# Patient Record
Sex: Female | Born: 1945 | Race: White | Hispanic: No | Marital: Single | Smoking: Never smoker
Health system: Southern US, Community
[De-identification: ages and names within clinical notes are randomized; demographics above are authoritative.]

## PROBLEM LIST (undated history)

## (undated) DIAGNOSIS — T8859XA Other complications of anesthesia, initial encounter: Secondary | ICD-10-CM

## (undated) DIAGNOSIS — C50911 Malignant neoplasm of unspecified site of right female breast: Secondary | ICD-10-CM

## (undated) DIAGNOSIS — R112 Nausea with vomiting, unspecified: Secondary | ICD-10-CM

## (undated) DIAGNOSIS — L900 Lichen sclerosus et atrophicus: Secondary | ICD-10-CM

## (undated) DIAGNOSIS — R011 Cardiac murmur, unspecified: Secondary | ICD-10-CM

## (undated) DIAGNOSIS — Z9889 Other specified postprocedural states: Secondary | ICD-10-CM

## (undated) DIAGNOSIS — Z923 Personal history of irradiation: Secondary | ICD-10-CM

## (undated) DIAGNOSIS — C50919 Malignant neoplasm of unspecified site of unspecified female breast: Secondary | ICD-10-CM

## (undated) DIAGNOSIS — E119 Type 2 diabetes mellitus without complications: Secondary | ICD-10-CM

## (undated) DIAGNOSIS — I10 Essential (primary) hypertension: Secondary | ICD-10-CM

## (undated) DIAGNOSIS — N189 Chronic kidney disease, unspecified: Secondary | ICD-10-CM

## (undated) DIAGNOSIS — G4730 Sleep apnea, unspecified: Secondary | ICD-10-CM

## (undated) HISTORY — DX: Lichen sclerosus et atrophicus: L90.0

## (undated) HISTORY — PX: ABDOMINAL HYSTERECTOMY: SHX81

## (undated) HISTORY — PX: COLONOSCOPY: SHX174

## (undated) HISTORY — PX: APPENDECTOMY: SHX54

## (undated) HISTORY — PX: SEPTOPLASTY: SUR1290

## (undated) HISTORY — DX: Malignant neoplasm of unspecified site of unspecified female breast: C50.919

## (undated) HISTORY — DX: Malignant neoplasm of unspecified site of right female breast: C50.911

## (undated) HISTORY — PX: TUBAL LIGATION: SHX77

## (undated) HISTORY — PX: RHINOPLASTY: SUR1284

---

## 2007-04-25 DIAGNOSIS — B029 Zoster without complications: Secondary | ICD-10-CM | POA: Insufficient documentation

## 2010-06-10 DIAGNOSIS — E119 Type 2 diabetes mellitus without complications: Secondary | ICD-10-CM | POA: Insufficient documentation

## 2010-06-10 DIAGNOSIS — I10 Essential (primary) hypertension: Secondary | ICD-10-CM | POA: Insufficient documentation

## 2010-06-17 DIAGNOSIS — L270 Generalized skin eruption due to drugs and medicaments taken internally: Secondary | ICD-10-CM | POA: Insufficient documentation

## 2015-09-22 DIAGNOSIS — R011 Cardiac murmur, unspecified: Secondary | ICD-10-CM | POA: Insufficient documentation

## 2016-06-16 ENCOUNTER — Emergency Department
Admission: EM | Admit: 2016-06-16 | Discharge: 2016-06-16 | Disposition: A | Discharge status: Home / Self Care | Payer: Medicare Other | Attending: Emergency Medicine | Admitting: Emergency Medicine

## 2016-06-16 ENCOUNTER — Encounter: Payer: Self-pay | Admitting: Emergency Medicine

## 2016-06-16 DIAGNOSIS — I10 Essential (primary) hypertension: Secondary | ICD-10-CM | POA: Insufficient documentation

## 2016-06-16 DIAGNOSIS — R319 Hematuria, unspecified: Secondary | ICD-10-CM | POA: Diagnosis present

## 2016-06-16 DIAGNOSIS — N3090 Cystitis, unspecified without hematuria: Secondary | ICD-10-CM | POA: Insufficient documentation

## 2016-06-16 DIAGNOSIS — E119 Type 2 diabetes mellitus without complications: Secondary | ICD-10-CM | POA: Insufficient documentation

## 2016-06-16 DIAGNOSIS — N390 Urinary tract infection, site not specified: Secondary | ICD-10-CM

## 2016-06-16 HISTORY — DX: Essential (primary) hypertension: I10

## 2016-06-16 HISTORY — DX: Type 2 diabetes mellitus without complications: E11.9

## 2016-06-16 LAB — URINALYSIS COMPLETE WITH MICROSCOPIC (ARMC ONLY)
BILIRUBIN URINE: NEGATIVE
Bacteria, UA: NONE SEEN
Glucose, UA: >500 mg/dL — AB
KETONES UR: NEGATIVE mg/dL
Nitrite: POSITIVE — AB
PH: 8 (ref 5.0–8.0)
Protein, ur: 100 mg/dL — AB
SQUAMOUS EPITHELIAL / LPF: NONE SEEN
Specific Gravity, Urine: 1.013 (ref 1.005–1.030)

## 2016-06-16 MED ORDER — LIDOCAINE HCL (PF) 1 % IJ SOLN
INTRAMUSCULAR | Status: AC
  Filled 2016-06-16: qty 5

## 2016-06-16 MED ORDER — CEPHALEXIN 500 MG PO CAPS: 500.0000 mg | ORAL_CAPSULE | Freq: Four times a day (QID) | ORAL | 0 refills | Status: AC

## 2016-06-16 MED ORDER — CEFTRIAXONE SODIUM 1 G IJ SOLR
1.0000 g | Freq: Once | INTRAMUSCULAR | Status: AC
  Administered 2016-06-16: 1 g via INTRAMUSCULAR
  Filled 2016-06-16 (×2): qty 10

## 2016-06-16 MED ORDER — FLUCONAZOLE 150 MG PO TABS: 150.0000 mg | ORAL_TABLET | Freq: Once | ORAL | 0 refills | Status: AC

## 2016-06-16 NOTE — ED Triage Notes (Addendum)
Patient ambulatory to triage with steady gait, without difficulty or distress noted; pt st x 2 days having urinary frequency and pressure; taking azo with some relief; denies abd/back pain

## 2016-06-16 NOTE — ED Provider Notes (Signed)
[facility] Emergency Department Provider Note   ____________________________________________   First MD Initiated Contact with Patient 06/16/16 0507     (approximate)  I have reviewed the triage vital signs and the nursing notes.   HISTORY  Chief Complaint Dysuria    HPI Morrigan Asti is a 70 y.o. female who comes into the hospital today with a UTI. She reports that she started having some symptoms 5 days ago. The patient reports that she had some pressure in her lower abdomen. She took Azo for 2 days as well as started drinking cranberry juice. She reports this started to feel well but better. Around 2 AM the patient got up to use the restroom. She did not turn on the light and didn't think much of it. She reports that 15 minutes later she felt the urge to urinate again when she did she noticed blood in her urine. The patient denies any fevers, denies any pain, denies any nausea or vomiting. She reports that she has been feeling well. She has had UTIs in the past. The patient has a history of diabetes and reports that due to stress her blood sugars have been a little bit out of whack. She reports that they have been in the 200s at home. The patient reports that she's been doing a lot with her mom so she wanted to get her urinary tract infection taken care of as quickly as possible.   Past Medical History:  Diagnosis Date  . Diabetes mellitus without complication ([place])   . Hypertension     There are no active problems to display for this patient.   Past Surgical History:  Procedure Laterality Date  . ABDOMINAL HYSTERECTOMY      Prior to Admission medications   Medication Sig Start Date End Date Taking? Authorizing Provider  cephALEXin (KEFLEX) 500 MG capsule Take 1 capsule (500 mg total) by mouth 4 (four) times daily. 06/16/16 06/26/16  Loney Hering, MD  fluconazole (DIFLUCAN) 150 MG tablet Take 1 tablet (150 mg total) by mouth once. 06/16/16  06/16/16  Loney Hering, MD    Allergies Bactrim [sulfamethoxazole-trimethoprim] and Macrodantin [nitrofurantoin macrocrystal]  No family history on file.  Social History Social History  Substance Use Topics  . Smoking status: Never Smoker  . Smokeless tobacco: Not on file  . Alcohol use No    Review of Systems Constitutional: No fever/chills Eyes: No visual changes. ENT: No sore throat. Cardiovascular: Denies chest pain. Respiratory: Denies shortness of breath. Gastrointestinal: Suprapubic pressure.  No nausea, no vomiting.  No diarrhea.  No constipation. Genitourinary: Hematuria, urgency Musculoskeletal: Negative for back pain. Skin: Negative for rash. Neurological: Negative for headaches, focal weakness or numbness.  10-point ROS otherwise negative.  ____________________________________________   PHYSICAL EXAM:  VITAL SIGNS: ED Triage Vitals  Enc Vitals Group     BP 06/16/16 0307 (!) 184/83     Pulse Rate 06/16/16 0307 72     Resp 06/16/16 0307 18     Temp 06/16/16 0307 97.9 F (36.6 C)     Temp Source 06/16/16 0307 Oral     SpO2 06/16/16 0307 97 %     Weight 06/16/16 0301 140 lb (63.5 kg)     Height 06/16/16 0301 4\' 11"  (1.499 m)     Head Circumference --      Peak Flow --      Pain Score --      Pain Loc --  Pain Edu? --      Excl. in [place]? --     Constitutional: Alert and oriented. Well appearing and in no acute distress. Eyes: Conjunctivae are normal. PERRL. EOMI. Head: Atraumatic. Nose: No congestion/rhinnorhea. Mouth/Throat: Mucous membranes are moist.  Oropharynx non-erythematous. Cardiovascular: Normal rate, regular rhythm. Grossly normal heart sounds.  Good peripheral circulation. Respiratory: Normal respiratory effort.  No retractions. Lungs CTAB. Gastrointestinal: Soft and nontender. No distention. Positive bowel sounds Musculoskeletal: No lower extremity tenderness nor edema.  Neurologic:  Normal speech and language.  Skin:  Skin is  warm, dry and intact.  Psychiatric: Mood and affect are normal.   ____________________________________________   LABS (all labs ordered are listed, but only abnormal results are displayed)  Labs Reviewed  URINALYSIS COMPLETEWITH MICROSCOPIC ([facility] ONLY) - Abnormal; Notable for the following:       Result Value   Color, Urine AMBER (*)    APPearance CLOUDY (*)    Glucose, UA >500 (*)    Hgb urine dipstick 3+ (*)    Protein, ur 100 (*)    Nitrite POSITIVE (*)    Leukocytes, UA 2+ (*)    All other components within normal limits  URINE CULTURE   ____________________________________________  EKG  none ____________________________________________  RADIOLOGY  none ____________________________________________   PROCEDURES  Procedure(s) performed: None  Procedures  Critical Care performed: No  ____________________________________________   INITIAL IMPRESSION / ASSESSMENT AND PLAN / ED COURSE  Pertinent labs & imaging results that were available during my care of the patient were reviewed by me and considered in my medical decision making (see chart for details).  This is a 70 year old female who comes into the hospital today with hematuria and UTI symptoms. The patient denies any fever or no nausea no vomiting no back pain. The patient's urinalysis is concerning for a urinary tract infection. She has positive nitrates, leuk esterase, too numerous to count white blood cells and red blood cells. I will give the patient a shot of ceftriaxone. As the patient has no other symptoms I will not check any further blood work. I will give the patient a prescription for antibiotics as well as for some Diflucan. She reports that she did have some vaginal itching but it is resolved at this time. In the event she develops a yeast infection after the antibiotic she can take this Diflucan. I have encouraged the patient to return with any further concerns or symptoms and to follow-up with her  primary care physician. The patient has no further questions or concerns.  Clinical Course     ____________________________________________   FINAL CLINICAL IMPRESSION(S) / ED DIAGNOSES  Final diagnoses:  UTI (lower urinary tract infection)  Cystitis      NEW MEDICATIONS STARTED DURING THIS VISIT:  New Prescriptions   CEPHALEXIN (KEFLEX) 500 MG CAPSULE    Take 1 capsule (500 mg total) by mouth 4 (four) times daily.   FLUCONAZOLE (DIFLUCAN) 150 MG TABLET    Take 1 tablet (150 mg total) by mouth once.     Note:  This document was prepared using Dragon voice recognition software and may include unintentional dictation errors.    Loney Hering, MD 06/16/16 [id number]

## 2016-06-16 NOTE — ED Notes (Signed)
Discharge instructions reviewed with patient. Patient verbalized understanding. Patient ambulated to lobby without difficulty.   

## 2016-06-18 LAB — URINE CULTURE

## 2016-12-19 ENCOUNTER — Encounter: Payer: Self-pay | Admitting: Emergency Medicine

## 2016-12-19 ENCOUNTER — Ambulatory Visit
Admission: EM | Admit: 2016-12-19 | Discharge: 2016-12-19 | Disposition: A | Discharge status: Home / Self Care | Payer: Medicare Other | Attending: Family Medicine | Admitting: Family Medicine

## 2016-12-19 DIAGNOSIS — T7840XA Allergy, unspecified, initial encounter: Secondary | ICD-10-CM | POA: Diagnosis not present

## 2016-12-19 DIAGNOSIS — R42 Dizziness and giddiness: Secondary | ICD-10-CM | POA: Diagnosis not present

## 2016-12-19 DIAGNOSIS — E139 Other specified diabetes mellitus without complications: Secondary | ICD-10-CM | POA: Diagnosis not present

## 2016-12-19 LAB — GLUCOSE, CAPILLARY: Glucose-Capillary: 232 mg/dL — ABNORMAL HIGH (ref 65–99)

## 2016-12-19 NOTE — ED Triage Notes (Signed)
Patient states that she started a diabetic medication on Wed.  Patient reports jittery, dizziness and cough that started this morning.

## 2016-12-19 NOTE — Discharge Instructions (Signed)
Over the counter zyrtec (one daily) Benadryl liquid (25mg ) at bedtime and during the day as needed Contact PCP on Monday

## 2016-12-19 NOTE — ED Provider Notes (Signed)
[facility]    CSN: [id number] Arrival date & time: 12/19/16  4098     History   Chief Complaint Chief Complaint  Patient presents with  . Dizziness    HPI Katessa Attridge is a 71 y.o. female.   71 yo female with a c/o nasal congestion, lightheadedness, mild cough and jitteriness for the past 2-3 days. Denies any fevers, chills, sore throat, chest pains, wheezing, shortness of breath. States she's never had seasonal allergies this severe and wondered if maybe symptoms were due to her new diabetic medication that she started 5 days ago.    The history is provided by the patient.  Dizziness  Quality:  Lightheadedness   Past Medical History:  Diagnosis Date  . Diabetes mellitus without complication ([place])   . Hypertension     There are no active problems to display for this patient.   Past Surgical History:  Procedure Laterality Date  . ABDOMINAL HYSTERECTOMY      OB History    No data available       Home Medications    Prior to Admission medications   Medication Sig Start Date End Date Taking? Authorizing Provider  aspirin EC 81 MG tablet Take 81 mg by mouth daily.   Yes Historical Provider, MD  Calcium Carbonate-Vit D-Min (CALCIUM 1200 PO) Take 1,200 mg by mouth 2 (two) times daily.   Yes Historical Provider, MD  empagliflozin (JARDIANCE) 10 MG TABS tablet Take 10 mg by mouth daily.   Yes Historical Provider, MD  glimepiride (AMARYL) 4 MG tablet Take 4 mg by mouth daily with breakfast.   Yes Historical Provider, MD  metFORMIN (GLUCOPHAGE) 1000 MG tablet Take 1,000 mg by mouth 2 (two) times daily with a meal.   Yes Historical Provider, MD  Multiple Vitamin (MULTIVITAMIN) tablet Take 1 tablet by mouth daily.   Yes Historical Provider, MD  Omega-3 Fatty Acids (FISH OIL) 1200 MG CAPS Take 2,400 mg by mouth 2 (two) times daily.   Yes Historical Provider, MD  pravastatin (PRAVACHOL) 80 MG tablet Take 80 mg by mouth daily.   Yes Historical Provider, MD    quinapril (ACCUPRIL) 20 MG tablet Take 20 mg by mouth at bedtime.   Yes Historical Provider, MD    Family History History reviewed. No pertinent family history.  Social History Social History  Substance Use Topics  . Smoking status: Never Smoker  . Smokeless tobacco: Never Used  . Alcohol use No     Allergies   Bactrim [sulfamethoxazole-trimethoprim] and Macrodantin [nitrofurantoin macrocrystal]   Review of Systems Review of Systems  Neurological: Positive for dizziness.     Physical Exam Triage Vital Signs ED Triage Vitals  Enc Vitals Group     BP 12/19/16 0931 135/73     Pulse Rate 12/19/16 0931 88     Resp 12/19/16 0931 16     Temp 12/19/16 0931 97.7 F (36.5 C)     Temp Source 12/19/16 0931 Oral     SpO2 12/19/16 0931 100 %     Weight 12/19/16 0929 140 lb (63.5 kg)     Height 12/19/16 0929 4\' 11"  (1.499 m)     Head Circumference --      Peak Flow --      Pain Score 12/19/16 0929 0     Pain Loc --      Pain Edu? --      Excl. in [place]? --    No data found.   Updated  Vital Signs BP 135/73 (BP Location: Right Arm)   Pulse 88   Temp 97.7 F (36.5 C) (Oral)   Resp 16   Ht 4\' 11"  (1.499 m)   Wt 140 lb (63.5 kg)   SpO2 100%   BMI 28.28 kg/m   Visual Acuity Right Eye Distance:   Left Eye Distance:   Bilateral Distance:    Right Eye Near:   Left Eye Near:    Bilateral Near:     Physical Exam  Constitutional: She appears well-developed and well-nourished. No distress.  HENT:  Head: Normocephalic and atraumatic.  Right Ear: Tympanic membrane, external ear and ear canal normal.  Left Ear: Tympanic membrane, external ear and ear canal normal.  Nose: Mucosal edema present. No rhinorrhea, nose lacerations, sinus tenderness, nasal deformity, septal deviation or nasal septal hematoma. No epistaxis.  No foreign bodies. Right sinus exhibits no maxillary sinus tenderness and no frontal sinus tenderness. Left sinus exhibits no maxillary sinus tenderness and no  frontal sinus tenderness.  Mouth/Throat: Uvula is midline, oropharynx is clear and moist and mucous membranes are normal. No oropharyngeal exudate.  Eyes: Conjunctivae and EOM are normal. Pupils are equal, round, and reactive to light. Right eye exhibits no discharge. Left eye exhibits no discharge. No scleral icterus.  Neck: Normal range of motion. Neck supple. No thyromegaly present.  Cardiovascular: Normal rate, regular rhythm and normal heart sounds.   Pulmonary/Chest: Effort normal and breath sounds normal. No respiratory distress. She has no wheezes. She has no rales.  Lymphadenopathy:    She has no cervical adenopathy.  Skin: No rash noted. She is not diaphoretic. No erythema.  Nursing note and vitals reviewed.    UC Treatments / Results  Labs (all labs ordered are listed, but only abnormal results are displayed) Labs Reviewed  GLUCOSE, CAPILLARY - Abnormal; Notable for the following:       Result Value   Glucose-Capillary 232 (*)    All other components within normal limits  CBG MONITORING, ED    EKG  EKG Interpretation None       Radiology No results found.  Procedures Procedures (including critical care time)  Medications Ordered in UC Medications - No data to display   Initial Impression / Assessment and Plan / UC Course  I have reviewed the triage vital signs and the nursing notes.  Pertinent labs & imaging results that were available during my care of the patient were reviewed by me and considered in my medical decision making (see chart for details).       Final Clinical Impressions(s) / UC Diagnoses   Final diagnoses:  Allergic reaction, initial encounter  (seasonal allergies vs other?)   New Prescriptions Discharge Medication List as of 12/19/2016  9:51 AM     1. Possible etiologies and diagnosis reviewed with patient 2. Recommend supportive treatment with otc allergy medication 3. Hold new diabetic medication tomorrow Nancy Fetter) and contact  PCP on Monday morning.  4. Follow-up prn if symptoms worsen or don't improve   Norval Gable, MD 12/19/16 1220

## 2017-10-18 ENCOUNTER — Other Ambulatory Visit: Payer: Self-pay | Admitting: Nurse Practitioner

## 2017-10-18 DIAGNOSIS — Z1231 Encounter for screening mammogram for malignant neoplasm of breast: Secondary | ICD-10-CM

## 2017-10-26 ENCOUNTER — Ambulatory Visit
Admission: RE | Admit: 2017-10-26 | Discharge: 2017-10-26 | Disposition: A | Discharge status: Home / Self Care | Payer: Medicare Other | Source: Ambulatory Visit | Attending: Nurse Practitioner | Admitting: Nurse Practitioner

## 2017-10-26 DIAGNOSIS — Z1231 Encounter for screening mammogram for malignant neoplasm of breast: Secondary | ICD-10-CM | POA: Insufficient documentation

## 2017-10-26 DIAGNOSIS — R928 Other abnormal and inconclusive findings on diagnostic imaging of breast: Secondary | ICD-10-CM | POA: Diagnosis not present

## 2017-10-26 DIAGNOSIS — N6310 Unspecified lump in the right breast, unspecified quadrant: Secondary | ICD-10-CM | POA: Diagnosis not present

## 2017-10-28 ENCOUNTER — Inpatient Hospital Stay
Admission: RE | Admit: 2017-10-28 | Discharge: 2017-10-28 | Disposition: A | Discharge status: Home / Self Care | Payer: Self-pay | Source: Ambulatory Visit | Attending: *Deleted | Admitting: *Deleted

## 2017-10-28 ENCOUNTER — Other Ambulatory Visit: Payer: Self-pay | Admitting: *Deleted

## 2017-10-28 DIAGNOSIS — Z9289 Personal history of other medical treatment: Secondary | ICD-10-CM

## 2017-11-08 ENCOUNTER — Other Ambulatory Visit: Payer: Self-pay | Admitting: Nurse Practitioner

## 2017-11-08 DIAGNOSIS — N6310 Unspecified lump in the right breast, unspecified quadrant: Secondary | ICD-10-CM

## 2017-11-08 DIAGNOSIS — R928 Other abnormal and inconclusive findings on diagnostic imaging of breast: Secondary | ICD-10-CM

## 2017-11-11 ENCOUNTER — Ambulatory Visit
Admission: RE | Admit: 2017-11-11 | Discharge: 2017-11-11 | Disposition: A | Discharge status: Home / Self Care | Payer: Medicare Other | Source: Ambulatory Visit | Attending: Nurse Practitioner | Admitting: Nurse Practitioner

## 2017-11-11 DIAGNOSIS — N6313 Unspecified lump in the right breast, lower outer quadrant: Secondary | ICD-10-CM | POA: Insufficient documentation

## 2017-11-11 DIAGNOSIS — R928 Other abnormal and inconclusive findings on diagnostic imaging of breast: Secondary | ICD-10-CM | POA: Insufficient documentation

## 2017-11-11 DIAGNOSIS — N6310 Unspecified lump in the right breast, unspecified quadrant: Secondary | ICD-10-CM

## 2017-11-11 DIAGNOSIS — N6311 Unspecified lump in the right breast, upper outer quadrant: Secondary | ICD-10-CM | POA: Diagnosis not present

## 2017-11-12 ENCOUNTER — Other Ambulatory Visit: Payer: Self-pay | Admitting: Nurse Practitioner

## 2017-11-12 DIAGNOSIS — R928 Other abnormal and inconclusive findings on diagnostic imaging of breast: Secondary | ICD-10-CM

## 2017-11-12 DIAGNOSIS — N6310 Unspecified lump in the right breast, unspecified quadrant: Secondary | ICD-10-CM

## 2017-11-15 ENCOUNTER — Encounter: Payer: Self-pay | Admitting: Anesthesiology

## 2017-11-15 ENCOUNTER — Ambulatory Visit
Admission: RE | Admit: 2017-11-15 | Discharge: 2017-11-15 | Disposition: A | Discharge status: Home / Self Care | Payer: Medicare Other | Source: Ambulatory Visit | Attending: Gastroenterology | Admitting: Gastroenterology

## 2017-11-15 ENCOUNTER — Ambulatory Visit: Payer: Medicare Other | Admitting: Anesthesiology

## 2017-11-15 ENCOUNTER — Encounter
Admission: RE | Disposition: A | Discharge status: Home / Self Care | Payer: Self-pay | Source: Ambulatory Visit | Attending: Gastroenterology

## 2017-11-15 DIAGNOSIS — Z882 Allergy status to sulfonamides status: Secondary | ICD-10-CM | POA: Diagnosis not present

## 2017-11-15 DIAGNOSIS — D122 Benign neoplasm of ascending colon: Secondary | ICD-10-CM | POA: Insufficient documentation

## 2017-11-15 DIAGNOSIS — Z7982 Long term (current) use of aspirin: Secondary | ICD-10-CM | POA: Insufficient documentation

## 2017-11-15 DIAGNOSIS — I10 Essential (primary) hypertension: Secondary | ICD-10-CM | POA: Insufficient documentation

## 2017-11-15 DIAGNOSIS — R011 Cardiac murmur, unspecified: Secondary | ICD-10-CM | POA: Insufficient documentation

## 2017-11-15 DIAGNOSIS — E119 Type 2 diabetes mellitus without complications: Secondary | ICD-10-CM | POA: Diagnosis not present

## 2017-11-15 DIAGNOSIS — Z79899 Other long term (current) drug therapy: Secondary | ICD-10-CM | POA: Diagnosis not present

## 2017-11-15 DIAGNOSIS — Z1211 Encounter for screening for malignant neoplasm of colon: Secondary | ICD-10-CM | POA: Diagnosis present

## 2017-11-15 DIAGNOSIS — Z8371 Family history of colonic polyps: Secondary | ICD-10-CM | POA: Diagnosis not present

## 2017-11-15 DIAGNOSIS — K5730 Diverticulosis of large intestine without perforation or abscess without bleeding: Secondary | ICD-10-CM | POA: Insufficient documentation

## 2017-11-15 DIAGNOSIS — Z7984 Long term (current) use of oral hypoglycemic drugs: Secondary | ICD-10-CM | POA: Diagnosis not present

## 2017-11-15 HISTORY — DX: Cardiac murmur, unspecified: R01.1

## 2017-11-15 HISTORY — PX: COLONOSCOPY WITH PROPOFOL: SHX5780

## 2017-11-15 LAB — GLUCOSE, CAPILLARY: Glucose-Capillary: 191 mg/dL — ABNORMAL HIGH (ref 65–99)

## 2017-11-15 SURGERY — COLONOSCOPY WITH PROPOFOL
Anesthesia: General

## 2017-11-15 MED ORDER — SODIUM CHLORIDE 0.9 % IV SOLN
INTRAVENOUS | Status: DC
  Administered 2017-11-15: 1000 mL via INTRAVENOUS

## 2017-11-15 MED ORDER — LIDOCAINE HCL (PF) 1 % IJ SOLN
2.0000 mL | Freq: Once | INTRAMUSCULAR | Status: AC
  Administered 2017-11-15: 0.3 mL via INTRADERMAL

## 2017-11-15 MED ORDER — PROPOFOL 500 MG/50ML IV EMUL
INTRAVENOUS | Status: AC
  Filled 2017-11-15: qty 50

## 2017-11-15 MED ORDER — PROPOFOL 10 MG/ML IV BOLUS
INTRAVENOUS | Status: DC | PRN
  Administered 2017-11-15: 70 mg via INTRAVENOUS

## 2017-11-15 MED ORDER — LIDOCAINE HCL (CARDIAC) 20 MG/ML IV SOLN
INTRAVENOUS | Status: DC | PRN
  Administered 2017-11-15: 30 mg via INTRAVENOUS

## 2017-11-15 MED ORDER — LIDOCAINE HCL (PF) 2 % IJ SOLN
INTRAMUSCULAR | Status: AC
  Filled 2017-11-15: qty 10

## 2017-11-15 MED ORDER — LIDOCAINE HCL (PF) 1 % IJ SOLN
INTRAMUSCULAR | Status: AC
  Administered 2017-11-15: 0.3 mL via INTRADERMAL
  Filled 2017-11-15: qty 2

## 2017-11-15 MED ORDER — PROPOFOL 500 MG/50ML IV EMUL
INTRAVENOUS | Status: DC | PRN
  Administered 2017-11-15: 100 ug/kg/min via INTRAVENOUS

## 2017-11-15 MED ORDER — SODIUM CHLORIDE 0.9 % IV SOLN
INTRAVENOUS | Status: DC
  Administered 2017-11-15: 11:00:00 via INTRAVENOUS

## 2017-11-15 NOTE — Op Note (Signed)
[facility] Gastroenterology Patient Name: Brianna Rogers Procedure Date: 11/15/2017 10:52 AM MRN: [id number] Account #: [id number] Date of Birth: [birth year] Admit Type: Outpatient Age: 72 Room: [facility] ENDO ROOM 1 Gender: Female Note Status: Finalized Procedure:            Colonoscopy Indications:          Family history of colonic polyps in a first-degree                        relative Providers:            Lollie Sails, MD Referring MD:         Juluis Rainier (Referring MD) Medicines:            Monitored Anesthesia Care Complications:        No immediate complications. Procedure:            Pre-Anesthesia Assessment:                       - ASA Grade Assessment: III - A patient with severe                        systemic disease.                       After obtaining informed consent, the colonoscope was                        passed under direct vision. Throughout the procedure,                        the patient's blood pressure, pulse, and oxygen                        saturations were monitored continuously. The                        Colonoscope was introduced through the anus and                        advanced to the the cecum, identified by appendiceal                        orifice and ileocecal valve. The colonoscopy was                        performed without difficulty. The patient tolerated the                        procedure well. The quality of the bowel preparation                        was good. Findings:      Multiple small-mouthed diverticula were found in the sigmoid colon and       descending colon.      A 1 mm polyp was found in the proximal ascending colon. The polyp was       sessile. The polyp was removed with a cold biopsy forceps. Resection and       retrieval were complete.      The digital rectal exam was normal. Impression:           -  Diverticulosis in the sigmoid colon and in the                        descending  colon.                       - One 1 mm polyp in the proximal ascending colon,                        removed with a cold biopsy forceps. Resected and                        retrieved. Recommendation:       - Discharge patient to home. Procedure Code(s):    --- Professional ---                       [id number], Colonoscopy, flexible; with biopsy, single or                        multiple CPT copyright 2016 American Medical Association. All rights reserved. The codes documented in this report are preliminary and upon coder review may  be revised to meet current compliance requirements. Lollie Sails, MD 11/15/2017 11:30:48 AM This report has been signed electronically. Number of Addenda: 0 Note Initiated On: 11/15/2017 10:52 AM Scope Withdrawal Time: 0 hours 6 minutes 20 seconds  Total Procedure Duration: 0 hours 24 minutes 3 seconds       [facility]

## 2017-11-15 NOTE — Anesthesia Postprocedure Evaluation (Signed)
Anesthesia Post Note  Patient: Brianna Rogers  Procedure(s) Performed: COLONOSCOPY WITH PROPOFOL (N/A )  Patient location during evaluation: Endoscopy Anesthesia Type: General Level of consciousness: awake and alert Pain management: pain level controlled Vital Signs Assessment: post-procedure vital signs reviewed and stable Respiratory status: spontaneous breathing, nonlabored ventilation, respiratory function stable and patient connected to nasal cannula oxygen Cardiovascular status: blood pressure returned to baseline and stable Postop Assessment: no apparent nausea or vomiting Anesthetic complications: no     Last Vitals:  Vitals:   11/15/17 1156 11/15/17 1206  BP: 125/66 (!) 112/58  Pulse: 70 78  Resp: 16 20  Temp:    SpO2: 98% 98%    Last Pain:  Vitals:   11/15/17 1136  TempSrc: Tympanic                 Precious Haws Piscitello

## 2017-11-15 NOTE — Anesthesia Preprocedure Evaluation (Signed)
Anesthesia Evaluation  Patient identified by MRN, date of birth, ID band Patient awake    Reviewed: Allergy & Precautions, H&P , NPO status , Patient's Chart, lab work & pertinent test results  History of Anesthesia Complications Negative for: history of anesthetic complications  Airway Mallampati: III  TM Distance: >3 FB Neck ROM: limited    Dental  (+) Chipped   Pulmonary neg pulmonary ROS, neg shortness of breath,           Cardiovascular Exercise Tolerance: Good hypertension, (-) angina(-) Past MI and (-) DOE + Valvular Problems/Murmurs      Neuro/Psych negative neurological ROS  negative psych ROS   GI/Hepatic negative GI ROS, Neg liver ROS, neg GERD  ,  Endo/Other  diabetes, Type 2  Renal/GU negative Renal ROS  negative genitourinary   Musculoskeletal   Abdominal   Peds  Hematology negative hematology ROS (+)   Anesthesia Other Findings Past Medical History: No date: Diabetes mellitus without complication (HCC) No date: Heart murmur No date: Hypertension  Past Surgical History: No date: ABDOMINAL HYSTERECTOMY No date: APPENDECTOMY No date: COLONOSCOPY No date: TUBAL LIGATION     Reproductive/Obstetrics negative OB ROS                             Anesthesia Physical Anesthesia Plan  ASA: III  Anesthesia Plan: General   Post-op Pain Management:    Induction: Intravenous  PONV Risk Score and Plan: Propofol infusion and TIVA  Airway Management Planned: Natural Airway and Nasal Cannula  Additional Equipment:   Intra-op Plan:   Post-operative Plan:   Informed Consent: I have reviewed the patients History and Physical, chart, labs and discussed the procedure including the risks, benefits and alternatives for the proposed anesthesia with the patient or authorized representative who has indicated his/her understanding and acceptance.   Dental Advisory Given  Plan  Discussed with: Anesthesiologist, CRNA and Surgeon  Anesthesia Plan Comments: (Patient consented for risks of anesthesia including but not limited to:  - adverse reactions to medications - risk of intubation if required - damage to teeth, lips or other oral mucosa - sore throat or hoarseness - Damage to heart, brain, lungs or loss of life  Patient voiced understanding.)        Anesthesia Quick Evaluation

## 2017-11-15 NOTE — H&P (Addendum)
Outpatient short stay form Pre-procedure 11/15/2017 10:51 AM Lollie Sails MD  Primary Physician: Gaetano Net NP  Reason for visit: Colonoscopy  History of present illness: Patient is a 72 year old female presenting today as above.  She has family history of colon polyps in a primary relative.  Held for several days.  She takes no other aspirin products or blood thinning agents.  She tolerated prep well.    Current Facility-Administered Medications:  .  0.9 %  sodium chloride infusion, , Intravenous, Continuous, Lollie Sails, MD, Last Rate: 20 mL/hr at 11/15/17 1036, 1,000 mL at 11/15/17 1036 .  0.9 %  sodium chloride infusion, , Intravenous, Continuous, Lollie Sails, MD  Medications Prior to Admission  Medication Sig Dispense Refill Last Dose  . aspirin EC 81 MG tablet Take 81 mg by mouth daily.   11/10/2017  . Calcium Carbonate-Vit D-Min (CALCIUM 1200 PO) Take 1,200 mg by mouth 2 (two) times daily.     . empagliflozin (JARDIANCE) 10 MG TABS tablet Take 10 mg by mouth daily.     Marland Kitchen glimepiride (AMARYL) 4 MG tablet Take 4 mg by mouth daily with breakfast.     . metFORMIN (GLUCOPHAGE) 1000 MG tablet Take 1,000 mg by mouth 2 (two) times daily with a meal.     . Multiple Vitamin (MULTIVITAMIN) tablet Take 1 tablet by mouth daily.     . Omega-3 Fatty Acids (FISH OIL) 1200 MG CAPS Take 2,400 mg by mouth 2 (two) times daily.     . pravastatin (PRAVACHOL) 80 MG tablet Take 80 mg by mouth daily.     . quinapril (ACCUPRIL) 20 MG tablet Take 20 mg by mouth at bedtime.        Allergies  Allergen Reactions  . Bactrim [Sulfamethoxazole-Trimethoprim]   . Macrodantin [Nitrofurantoin Macrocrystal]      Past Medical History:  Diagnosis Date  . Diabetes mellitus without complication ([place])   . Heart murmur   . Hypertension     Review of systems:      Physical Exam    Heart and lungs: Regular rate and rhythm without rub or gallop.    HEENT: Normocephalic atraumatic eyes  are anicteric    Other:    Pertinant exam for procedure: Soft nontender nondistended bowel sounds positive normoactive.    Planned proceedures: Colonoscopy and indicated procedures. I have discussed the risks benefits and complications of procedures to include not limited to bleeding, infection, perforation and the risk of sedation and the patient wishes to proceed.    Lollie Sails, MD Gastroenterology 11/15/2017  10:51 AM

## 2017-11-15 NOTE — Anesthesia Post-op Follow-up Note (Signed)
Anesthesia QCDR form completed.        

## 2017-11-15 NOTE — Transfer of Care (Signed)
Immediate Anesthesia Transfer of Care Note  Patient: Brianna Rogers  Procedure(s) Performed: COLONOSCOPY WITH PROPOFOL (N/A )  Patient Location: PACU and Endoscopy Unit  Anesthesia Type:General  Level of Consciousness: awake  Airway & Oxygen Therapy: Patient Spontanous Breathing  Post-op Assessment: Report given to RN  Post vital signs: stable  Last Vitals: There were no vitals filed for this visit.  Last Pain: There were no vitals filed for this visit.       Complications: No apparent anesthesia complications

## 2017-11-16 ENCOUNTER — Encounter: Payer: Self-pay | Admitting: Gastroenterology

## 2017-11-16 LAB — SURGICAL PATHOLOGY

## 2017-11-18 ENCOUNTER — Ambulatory Visit
Admission: RE | Admit: 2017-11-18 | Discharge: 2017-11-18 | Disposition: A | Discharge status: Home / Self Care | Payer: Medicare Other | Source: Ambulatory Visit | Attending: Nurse Practitioner | Admitting: Nurse Practitioner

## 2017-11-18 DIAGNOSIS — C50411 Malignant neoplasm of upper-outer quadrant of right female breast: Secondary | ICD-10-CM | POA: Insufficient documentation

## 2017-11-18 DIAGNOSIS — N6310 Unspecified lump in the right breast, unspecified quadrant: Secondary | ICD-10-CM | POA: Diagnosis present

## 2017-11-18 DIAGNOSIS — N6311 Unspecified lump in the right breast, upper outer quadrant: Secondary | ICD-10-CM | POA: Insufficient documentation

## 2017-11-18 DIAGNOSIS — R928 Other abnormal and inconclusive findings on diagnostic imaging of breast: Secondary | ICD-10-CM | POA: Insufficient documentation

## 2017-11-18 DIAGNOSIS — C50911 Malignant neoplasm of unspecified site of right female breast: Secondary | ICD-10-CM

## 2017-11-18 HISTORY — PX: BREAST BIOPSY: SHX20

## 2017-11-18 HISTORY — DX: Malignant neoplasm of unspecified site of right female breast: C50.911

## 2017-11-23 ENCOUNTER — Other Ambulatory Visit: Payer: Self-pay

## 2017-11-23 NOTE — Progress Notes (Signed)
  Oncology Nurse Navigator Documentation  Navigator Location: [facility] Onc (11/23/17 1600)   )Navigator Encounter Type: Introductory phone call (11/23/17 1600)   Abnormal Finding Date: 11/11/17 (11/23/17 1600) Confirmed Diagnosis Date: 12/16/17 (11/23/17 1600)                 Treatment Phase: Pre-Tx/Tx Discussion (11/23/17 1600) Barriers/Navigation Needs: Education;Coordination of Care (11/23/17 1600) Education: Understanding Cancer/ Treatment Options;Coping with Diagnosis/ Prognosis;Newly Diagnosed Cancer Education (11/23/17 1600)                        Time Spent with Patient: 30 (11/23/17 1600)  Phoned patient to introduce Navigation service.  Patient has Med/Onc consult with Dr. Mike Gip in [facility] tomorrow.  She sees Dr. Peyton Najjar on Friday.  Patient is an only chold, and is primary care for 36 year old mother who lives in [place].  She travels every other day to her home in [place] to take care of her animals, but is planning to move back to [facility] soon. States that is why she is moving her care to [facility].  States she has a good support system.  She will be given Breast Cancer Treatment Handbook/folder with hospital services at Med/Onc consult.

## 2017-11-24 ENCOUNTER — Encounter: Payer: Self-pay | Admitting: Hematology and Oncology

## 2017-11-24 ENCOUNTER — Inpatient Hospital Stay: Payer: Medicare Other | Attending: Hematology and Oncology | Admitting: Hematology and Oncology

## 2017-11-24 ENCOUNTER — Inpatient Hospital Stay: Payer: Medicare Other

## 2017-11-24 VITALS — BP 165/89 | HR 89 | Temp 97.9°F | Resp 18 | Ht 59.75 in | Wt 140.7 lb

## 2017-11-24 DIAGNOSIS — C50411 Malignant neoplasm of upper-outer quadrant of right female breast: Secondary | ICD-10-CM

## 2017-11-24 DIAGNOSIS — Z170 Estrogen receptor positive status [ER+]: Secondary | ICD-10-CM | POA: Insufficient documentation

## 2017-11-24 DIAGNOSIS — Z803 Family history of malignant neoplasm of breast: Secondary | ICD-10-CM | POA: Insufficient documentation

## 2017-11-24 DIAGNOSIS — M85852 Other specified disorders of bone density and structure, left thigh: Secondary | ICD-10-CM

## 2017-11-24 DIAGNOSIS — R17 Unspecified jaundice: Secondary | ICD-10-CM

## 2017-11-24 DIAGNOSIS — C50811 Malignant neoplasm of overlapping sites of right female breast: Secondary | ICD-10-CM | POA: Insufficient documentation

## 2017-11-24 DIAGNOSIS — R799 Abnormal finding of blood chemistry, unspecified: Secondary | ICD-10-CM

## 2017-11-24 LAB — BILIRUBIN, DIRECT: Bilirubin, Direct: 0.1 mg/dL (ref 0.1–0.5)

## 2017-11-24 LAB — COMPREHENSIVE METABOLIC PANEL
ALT: 21 U/L (ref 14–54)
ANION GAP: 11 (ref 5–15)
AST: 21 U/L (ref 15–41)
Albumin: 4.8 g/dL (ref 3.5–5.0)
Alkaline Phosphatase: 65 U/L (ref 38–126)
BUN: 19 mg/dL (ref 6–20)
CALCIUM: 10.6 mg/dL — AB (ref 8.9–10.3)
CHLORIDE: 99 mmol/L — AB (ref 101–111)
CO2: 26 mmol/L (ref 22–32)
CREATININE: 0.82 mg/dL (ref 0.44–1.00)
Glucose, Bld: 163 mg/dL — ABNORMAL HIGH (ref 65–99)
Potassium: 3.8 mmol/L (ref 3.5–5.1)
SODIUM: 136 mmol/L (ref 135–145)
Total Bilirubin: 1.3 mg/dL — ABNORMAL HIGH (ref 0.3–1.2)
Total Protein: 8.6 g/dL — ABNORMAL HIGH (ref 6.5–8.1)

## 2017-11-24 LAB — CBC WITH DIFFERENTIAL/PLATELET
BASOS PCT: 1 %
Basophils Absolute: 0.1 10*3/uL (ref 0–0.1)
EOS ABS: 0.2 10*3/uL (ref 0–0.7)
Eosinophils Relative: 2 %
HEMATOCRIT: 46.4 % (ref 35.0–47.0)
HEMOGLOBIN: 15.9 g/dL (ref 12.0–16.0)
Lymphocytes Relative: 21 %
Lymphs Abs: 1.9 10*3/uL (ref 1.0–3.6)
MCH: 30.5 pg (ref 26.0–34.0)
MCHC: 34.2 g/dL (ref 32.0–36.0)
MCV: 89.2 fL (ref 80.0–100.0)
MONOS PCT: 6 %
Monocytes Absolute: 0.6 10*3/uL (ref 0.2–0.9)
NEUTROS ABS: 6.1 10*3/uL (ref 1.4–6.5)
NEUTROS PCT: 70 %
Platelets: 223 10*3/uL (ref 150–440)
RBC: 5.21 MIL/uL — ABNORMAL HIGH (ref 3.80–5.20)
RDW: 13.6 % (ref 11.5–14.5)
WBC: 8.8 10*3/uL (ref 3.6–11.0)

## 2017-11-24 NOTE — Progress Notes (Signed)
[name] day:  11/24/2017  Chief Complaint: Brianna Rogers is a 72 y.o. female with right breast cancer who is referred in consultation by Gaetano Net, FNP for assessment and management.  HPI:  The patient undergoes yearly mammograms.  Screening mammogram on 09/05/2016 at [facility] revealed no significant abnormality.  Bilateral screening mammogram on 10/26/2017 revealed a possible mass int the right breast.  There were no suspicious findings in the left breast.  Diagnostic right mammogram and ultrasound on 11/11/2017 revealed a suspicious mass in the 9 o'clock position of the right breast.  Targeted ultrasound revealed a 1.5 x 2.2 x 1.9 cm mass with posterior acoustic shadowing 10 cm from the nipple.  The right axilla was negative for adenopathy.  She underwent ultrasound guided core biopsy of the mass on 11/18/2017.  Pathology revealed grade II invasive mammary carcinoma. ER, PR, and Her2/neu are pending.  She has an appointment with Dr. Windell Moment on 11/26/2017.  She underwent colonoscopy on 11/15/2017 by Dr. Gustavo Lah.  There was diverticulosis in the sigmoid colon and in the descending colon.  There was one 1 mm polyp in the proximal ascending colon.  Pathology revealed a tubular adenoma negative for high grade dysplasia or malignancy.  Symptomatically, she feels "good". Patient notes "discomfort" and bruising to her RIGHT breast biopsy site. She denies fevers, sweats, and significant weight loss. Patient denies recent infections. Patient has area of skin concern on her abdomen. She plans to see dermatology.   She denies pain in the clinic today. Patient performs monthly self breast examinations as recommended.   Menarche was as the age 69. Patient is a G0P0. She used contraceptives in her early 3s for 3 years.  She denies post menopausal hormone replacement therapy.  She had a total hysterectomy in her early 45s.   He mother had  breast cancer in her 18s.  Three paternal aunts had breast cancer (ages: 63s, 59s, 25s).  A maternal aunt had lung cancer.  Her maternal grandmother had renal cell carcinoma.  Her maternal great grandmother had "female cancer" in her 66s.  There is no family history of ovarian cancer.  There has been no genetic testing.   Past Medical History:  Diagnosis Date  . Breast cancer ([place])   . Breast cancer, right ([place]) 11/18/2017  . Diabetes mellitus without complication ([place])   . Heart murmur   . Hypertension     Past Surgical History:  Procedure Laterality Date  . ABDOMINAL HYSTERECTOMY    . APPENDECTOMY    . BREAST BIOPSY Right 11/18/2017   path pending  . COLONOSCOPY    . COLONOSCOPY WITH PROPOFOL N/A 11/15/2017   Procedure: COLONOSCOPY WITH PROPOFOL;  Surgeon: Lollie Sails, MD;  Location: [facility] ENDOSCOPY;  Service: Endoscopy;  Laterality: N/A;  . TUBAL LIGATION      Family History  Problem Relation Age of Onset  . Breast cancer Mother 26  . Cancer Mother   . Cancer Maternal Aunt   . Cancer Maternal Grandmother     Social History:  reports that  has never smoked. she has never used smokeless tobacco. She reports that she does not drink alcohol or use drugs.  She is an only child.  She is the primary caregiver for her 6 year old mother who lives in [place].  She travels every other day to her home in [place] to take care of her animals, but is planning to move back to  [facility] soon. Patient is retired from [employer]. The patient is alone today.  Allergies:  Allergies  Allergen Reactions  . Bactrim [Sulfamethoxazole-Trimethoprim] Other (See Comments)    Unknown  . Macrodantin [Nitrofurantoin Macrocrystal] Other (See Comments)    Unknown  . Sulfa Antibiotics Other (See Comments)    Flu like symptoms    Current Medications: Current Outpatient Medications  Medication Sig Dispense Refill  . aspirin EC 81 MG tablet Take 81 mg by mouth every evening.     .  empagliflozin (JARDIANCE) 10 MG TABS tablet Take 10 mg by mouth daily.    Marland Kitchen glimepiride (AMARYL) 4 MG tablet Take 4 mg by mouth daily with breakfast.    . metFORMIN (GLUCOPHAGE) 1000 MG tablet Take 1,000 mg by mouth 2 (two) times daily with a meal.    . Multiple Vitamin (MULTIVITAMIN) tablet Take 1 tablet by mouth daily.    . Omega-3 Fatty Acids (FISH OIL) 1200 MG CAPS Take 2,400 mg by mouth 2 (two) times daily.    . pravastatin (PRAVACHOL) 80 MG tablet Take 80 mg by mouth every evening.     . quinapril (ACCUPRIL) 20 MG tablet Take 20 mg by mouth daily.     Marland Kitchen acetaminophen (TYLENOL) 500 MG tablet Take 500 mg by mouth every 6 (six) hours as needed for moderate pain or headache.    . ibuprofen (ADVIL,MOTRIN) 200 MG tablet Take 200 mg by mouth every 6 (six) hours as needed for headache or moderate pain.     No current facility-administered medications for this visit.     Review of Systems:  GENERAL:  Feels good.  No fevers, sweats or weight loss. PERFORMANCE STATUS (ECOG):  0 HEENT:  No visual changes, runny nose, sore throat, mouth sores or tenderness. Lungs: No shortness of breath or cough.  No hemoptysis. Cardiac:  No chest pain, palpitations, orthopnea, or PND. GI:  No nausea, vomiting, diarrhea, constipation, melena or hematochezia. GU:  No urgency, frequency, dysuria, or hematuria. Musculoskeletal:  Osteopenia.  No back pain.  No muscle tenderness. Extremities:  No pain or swelling. Skin:  Bruising with discomfort after breast biopsy.  Abdominal rash, plans to see dermatologist. Neuro:  No headache, numbness or weakness, balance or coordination issues. Endocrine:  No diabetes, thyroid issues, hot flashes or night sweats. Psych:  No mood changes, depression or anxiety. Pain:  No focal pain. Review of systems:  All other systems reviewed and found to be negative.  Physical Exam: Blood pressure (!) 165/89, pulse 89, temperature 97.9 F (36.6 C), temperature source Tympanic, resp. rate  18, height 4' 11.75" (1.518 m), weight 140 lb 10.5 oz (63.8 kg). GENERAL:  Well developed, well nourished, woman sitting comfortably in the exam room in no acute distress. MENTAL STATUS:  Alert and oriented to person, place and time. HEAD:  Styled gray hair.  Normocephalic, atraumatic, face symmetric, no Cushingoid features. EYES:  Glasses.  Brown eyes.  Pupils equal round and reactive to light and accomodation.  No conjunctivitis or scleral icterus. ENT:  Oropharynx clear without lesion.  Tongue normal. Mucous membranes moist.  RESPIRATORY:  Clear to auscultation without rales, wheezes or rhonchi. CARDIOVASCULAR:  Regular rate and rhythm without murmur, rub or gallop. BREAST:  Right breast s/p biopsy with associated ecchymosis.  2 cm firm nodule palpable at the 9 o'clock position.  Left breast with inferior fibrocystic changes.  Inverted nipple.  No mass, skin changes or nipple discharge.  ABDOMEN:  Soft, non-tender, with active bowel sounds,  and no hepatosplenomegaly.  No masses. SKIN:  Tan with lacy band-like hypopigmentation across lower abdomen.  No rashes, ulcers or lesions. EXTREMITIES: No edema, no skin discoloration or tenderness.  No palpable cords. LYMPH NODES: No palpable cervical, supraclavicular, axillary or inguinal adenopathy  NEUROLOGICAL: Unremarkable. PSYCH:  Appropriate.   Office Visit on 11/24/2017  Component Date Value Ref Range Status  . Sodium 11/24/2017 136  135 - 145 mmol/L Final  . Potassium 11/24/2017 3.8  3.5 - 5.1 mmol/L Final  . Chloride 11/24/2017 99* 101 - 111 mmol/L Final  . CO2 11/24/2017 26  22 - 32 mmol/L Final  . Glucose, Bld 11/24/2017 163* 65 - 99 mg/dL Final  . BUN 11/24/2017 19  6 - 20 mg/dL Final  . Creatinine, Ser 11/24/2017 0.82  0.44 - 1.00 mg/dL Final  . Calcium 11/24/2017 10.6* 8.9 - 10.3 mg/dL Final  . Total Protein 11/24/2017 8.6* 6.5 - 8.1 g/dL Final  . Albumin 11/24/2017 4.8  3.5 - 5.0 g/dL Final  . AST 11/24/2017 21  15 - 41 U/L Final   . ALT 11/24/2017 21  14 - 54 U/L Final  . Alkaline Phosphatase 11/24/2017 65  38 - 126 U/L Final  . Total Bilirubin 11/24/2017 1.3* 0.3 - 1.2 mg/dL Final  . GFR calc non Af Amer 11/24/2017 >60  >60 mL/min Final  . GFR calc Af Amer 11/24/2017 >60  >60 mL/min Final   Comment: (NOTE) The eGFR has been calculated using the CKD EPI equation. This calculation has not been validated in all clinical situations. eGFR's persistently <60 mL/min signify possible Chronic Kidney Disease.   Georgiann Hahn gap 11/24/2017 11  5 - 15 Final   Performed at [facility], [address], [place], [place] [address]  . WBC 11/24/2017 8.8  3.6 - 11.0 K/uL Final  . RBC 11/24/2017 5.21* 3.80 - 5.20 MIL/uL Final  . Hemoglobin 11/24/2017 15.9  12.0 - 16.0 g/dL Final  . HCT 11/24/2017 46.4  35.0 - 47.0 % Final  . MCV 11/24/2017 89.2  80.0 - 100.0 fL Final  . MCH 11/24/2017 30.5  26.0 - 34.0 pg Final  . MCHC 11/24/2017 34.2  32.0 - 36.0 g/dL Final  . RDW 11/24/2017 13.6  11.5 - 14.5 % Final  . Platelets 11/24/2017 223  150 - 440 K/uL Final  . Neutrophils Relative % 11/24/2017 70  % Final  . Neutro Abs 11/24/2017 6.1  1.4 - 6.5 K/uL Final  . Lymphocytes Relative 11/24/2017 21  % Final  . Lymphs Abs 11/24/2017 1.9  1.0 - 3.6 K/uL Final  . Monocytes Relative 11/24/2017 6  % Final  . Monocytes Absolute 11/24/2017 0.6  0.2 - 0.9 K/uL Final  . Eosinophils Relative 11/24/2017 2  % Final  . Eosinophils Absolute 11/24/2017 0.2  0 - 0.7 K/uL Final  . Basophils Relative 11/24/2017 1  % Final  . Basophils Absolute 11/24/2017 0.1  0 - 0.1 K/uL Final   Performed at [facility], [address] [place], [place] [address]  . CA 27.29 11/24/2017 24.7  0.0 - 38.6 U/mL Final   Comment: (NOTE) Siemens Centaur Immunochemiluminometric Methodology [facility]) Values obtained with different assay methods or kits cannot be used interchangeably. Results cannot be interpreted as absolute evidence of the presence or  absence of malignant disease. Performed At: [facility] [place], [place] [id number] Rush Farmer MD [id number] Performed at [facility], [address] [place],  [address]   .  Kappa free light chain 11/24/2017 15.0  3.3 - 19.4 mg/L Final  . Lamda free light chains 11/24/2017 10.8  5.7 - 26.3 mg/L Final  . Kappa, lamda light chain ratio 11/24/2017 1.39  0.26 - 1.65 Final   Comment: (NOTE) Performed At: [facility] [place], [place] [id number] Rush Farmer MD [id number] Performed at [facility], [address] [place], [place] [address]   . Bilirubin, Direct 11/24/2017 0.1  0.1 - 0.5 mg/dL Final   Performed at [facility], [address], [place], [place] [address]  . PTH 11/24/2017 14* 15 - 65 pg/mL Final  . Calcium, Total (PTH) 11/24/2017 10.4* 8.7 - 10.3 mg/dL Final  . PTH Interp 11/24/2017 Comment   Final   Comment: (NOTE) Interpretation                 Intact PTH    Calcium                                (pg/mL)      (mg/dL) Normal                          15 - 65     8.6 - 10.2 Primary Hyperparathyroidism         >65          >10.2 Secondary Hyperparathyroidism       >65          <10.2 Non-Parathyroid Hypercalcemia       <65          >10.2 Hypoparathyroidism                  <15          < 8.6 Non-Parathyroid Hypocalcemia    15 - 65          < 8.6 Performed At: [facility] [place], [place] [id number] Rush Farmer MD [id number] Performed at [facility], [address], [place], [place] [address]     Assessment:  Brianna Rogers is a 72 y.o. female with clinical stage T2N0M0 right breast cancer s/p biopsy on 11/18/2017.  Pathology revealed grade II invasive mammary carcinoma. ER, PR, and Her2/neu are pending.  Diagnostic right mammogram and ultrasound on 11/11/2017 revealed a suspicious mass in the 9 o'clock position of the  right breast.  Targeted ultrasound revealed a 1.5 x 2.2 x 1.9 cm mass with posterior acoustic shadowing 10 cm from the nipple.  The right axilla was negative for adenopathy.  Bone density on 12/09/2015 revealed osteopenia with a T-score of -1.3 in the left hip.  She has a family history of breast cancer x 4 (mother and 3 paternal aunts) and "female cancer" (maternal great grandmother).  Symptomatically, she feels well.  Exam reveals a 2 cm mass in the right breast with no palpable axillary adenopathy.    Plan: 1.  Discuss diagnosis, staging, and management of breast cancer. Discuss lumpectomy vs. mastectomy.  Discuss awaiting ER, PR, and Her2/neu status.  If tumor is ER/PR + and Her2/neu negative, anticipate lumpectomy followed by radiation.  Anticipate sending Oncotype DX on tumor if lymph node status negative, hormone receptor positive and Her2/neu negative to assess the need for chemotherapy.  Discuss plan for chemotherapy if tumor is triple negative or Her2/neu positive. 2.  Labs today:  CBC  with diff, CMP, CA27.29. 3.  Discuss genetic testing given family history.  Patient agrees.  Invitae kit for BRCA 1/2.  Await test results prior to surgery. 4.  Follow up with Dr. Windell Moment (surgeon) as scheduled on 11/26/2017. 5.  Bone density study after 12/08/2017 6.  Present at tumor board on 11/29/2017. 7.  RTC on 11/30/2017 in [place] for review of work-up and discussion regarding direction of therapy  Addendum 1:  Additional labs drawn today for evaluation of a slightly elevated bilirubin (1.3) and elevated serum protein.  Direct bilirubin was 0.1 (normal).  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  SPEP and free light chain assay were normal.  She has an elevated calcium.  Discuss holding calcium and checking PTH.  Addendum 2:  Tumor is ER + (> 90%), PR + (>90%), and Her2/neu 2+.  FISH is pending.   Honor Loh, NP  11/24/2017, 11:03 AM   I saw and evaluated the patient, participating in the key  portions of the service and reviewing pertinent diagnostic studies and records.  I reviewed the nurse practitioner's note and agree with the findings and the plan.  The assessment and plan were discussed with the patient.  Multiple questions were asked by the patient and answered.   Nolon Stalls, MD 11/24/2017, 11:03 AM

## 2017-11-24 NOTE — Progress Notes (Signed)
Patient here today as new evaluation regarding right breast cancer.  Referred by Gaetano Net.   Patient had breast biopsy on Thursday, Feb. 28.  States she is still tender @ biopsy site.

## 2017-11-25 LAB — PTH, INTACT AND CALCIUM
Calcium, Total (PTH): 10.4 mg/dL — ABNORMAL HIGH (ref 8.7–10.3)
PTH: 14 pg/mL — ABNORMAL LOW (ref 15–65)

## 2017-11-25 LAB — KAPPA/LAMBDA LIGHT CHAINS
Kappa free light chain: 15 mg/L (ref 3.3–19.4)
Kappa, lambda light chain ratio: 1.39 (ref 0.26–1.65)
Lambda free light chains: 10.8 mg/L (ref 5.7–26.3)

## 2017-11-25 LAB — CANCER ANTIGEN 27.29: CA 27.29: 24.7 U/mL (ref 0.0–38.6)

## 2017-11-26 LAB — MULTIPLE MYELOMA PANEL, SERUM
Albumin SerPl Elph-Mcnc: 4 g/dL (ref 2.9–4.4)
Albumin/Glob SerPl: 1.3 (ref 0.7–1.7)
Alpha 1: 0.2 g/dL (ref 0.0–0.4)
Alpha2 Glob SerPl Elph-Mcnc: 0.9 g/dL (ref 0.4–1.0)
B-Globulin SerPl Elph-Mcnc: 1.3 g/dL (ref 0.7–1.3)
Gamma Glob SerPl Elph-Mcnc: 0.8 g/dL (ref 0.4–1.8)
Globulin, Total: 3.2 g/dL (ref 2.2–3.9)
IgA: 272 mg/dL (ref 87–352)
IgG (Immunoglobin G), Serum: 784 mg/dL (ref 700–1600)
IgM (Immunoglobulin M), Srm: 77 mg/dL (ref 26–217)
Total Protein ELP: 7.2 g/dL (ref 6.0–8.5)

## 2017-11-29 ENCOUNTER — Ambulatory Visit: Payer: Self-pay | Admitting: General Surgery

## 2017-11-29 ENCOUNTER — Other Ambulatory Visit: Payer: Self-pay | Admitting: General Surgery

## 2017-11-29 DIAGNOSIS — C50411 Malignant neoplasm of upper-outer quadrant of right female breast: Secondary | ICD-10-CM

## 2017-11-29 DIAGNOSIS — Z170 Estrogen receptor positive status [ER+]: Principal | ICD-10-CM

## 2017-11-29 LAB — SURGICAL PATHOLOGY

## 2017-11-29 NOTE — H&P (Signed)
PATIENT PROFILE: Brianna Rogers is a 72 y.o. female who presents to the Clinic for consultation at the request of Dr. Dayton Rogers for evaluation of right breast cancer.  PCP:  Brianna Lange, NP  HISTORY OF PRESENT ILLNESS: Brianna Rogers reports having regular screening mammography, since she cannot refers any symptoms from her breast since last mammography. Patient denies breast pain. Denies feeling a mass. Denies nipple retraction or drainage. Denies skin changes.   Last two mammograpies: Screening mammogram on 09/05/2016, negative and the last one 10/26/17 with a suspicious mass on the right breast at 9 o'clock. Biopsy of the mass revealed invasive mammary cancer. No lymph nodes seen on ultrasound.   First menstrual cycle at age 44 Last menstrual cycle ~72 years old after hysterectomy No pregnancies No hormonal therapies. 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  Pending core bx.      Diabetes mellitus, type 2 (CMS-HCC) Unknown   Overview    Followed by Dr. Eddie Rogers      Microalbuminuric diabetic nephropathy (CMS-HCC) Unknown   Hypertension Unknown   Hyperlipidemia, mixed Unknown   Cardiac murmur, unspecified 09/22/2015   Overview    2/6 systolic best heard at 2nd Lt ICS         GENERAL REVIEW OF SYSTEMS:   General ROS: negative for - chills, fatigue, fever, weight gain or weight loss Allergy and Immunology ROS: negative for - hives  Hematological and Lymphatic ROS: negative for - bleeding problems or bruising, negative for palpable nodes Endocrine ROS: negative for - heat or cold intolerance, hair changes Respiratory ROS: negative for - cough, shortness  of breath or wheezing Cardiovascular ROS: no chest pain or palpitations GI ROS: negative for nausea, vomiting, abdominal pain, diarrhea, constipation Musculoskeletal ROS: negative for - joint swelling or muscle pain Neurological ROS: negative for - confusion, syncope Dermatological ROS: negative for pruritus and rash Psychiatric: negative for anxiety, depression, difficulty sleeping and memory loss  MEDICATIONS: CurrentMedications        Current Outpatient Medications  Medication Sig Dispense Refill  . CALCIUM ACETATE ORAL Take 1,200 mg by mouth 2 (two) times daily.    Marland Kitchen aspirin 81 MG EC tablet Take 81 mg by mouth once daily.    . flash glucose sensor (FREESTYLE LIBRE SENSOR) Kit Use 3 each every 10 (ten) days. 3 kit 11  . glimepiride (AMARYL) 4 MG tablet Take 1 tablet (4 mg total) by mouth every morning 90 tablet 3  . JARDIANCE 10 mg Tab TAKE 1 TABLET BY MOUTH EVERY DAY BEFORE BREAKFAST 30 tablet 5  . metFORMIN (GLUCOPHAGE) 1000 MG tablet Take 1 tablet (1,000 mg total) by mouth 2 (two) times daily with meals 180 tablet 1  . multivitamin tablet Take 1 tablet by mouth once daily.    Marland Kitchen omega-3 fatty acids-fish oil 360-1,200 mg Cap Take 1,200 mg by mouth 2 (two) times daily.    . pravastatin (PRAVACHOL) 80 MG tablet TAKE 1 TABLET(80 MG) BY MOUTH EVERY DAY 90 tablet 1  . quinapril (ACCUPRIL) 20 MG tablet TAKE 1 TABLET(20 MG) BY MOUTH EVERY DAY 90 tablet 1   No current facility-administered  medications for this visit.       ALLERGIES: Nitrofurantoin macrocrystal and Sulfa (sulfonamide antibiotics)  PAST MEDICAL HISTORY:     Past Medical History:  Diagnosis Date  . Breast mass, right 11/11/2017   Irreg hypoechoic mass 1.5x2.2x1.9 cm w/ negative axillary adenopathy.  Pending core bx.  . Cardiac murmur, unspecified 5093   2/6 systolic best heard at 2nd Lt ICS  . Cherry angioma   . Deviated septum    s/p "scraping" procedure yrs ago, but sx returned  . Diabetes  mellitus, type 2 (CMS-HCC) ~2003   Followed by Dr. Eddie Rogers  . Diverticula of colon   . Hyperlipidemia, mixed ~2003  . Hypertension ~2003  . Microalbuminuric diabetic nephropathy (CMS-HCC)   . Osteopenia    Based on Dexa from 12/09/15  . Seborrheic keratoses   . Shingles 2008  . Vitamin D deficiency, unspecified 2014   Tx'd & resolved  . Wart    Tx'd w/ cryo x1 by Dermatology    PAST SURGICAL HISTORY:      Past Surgical History:  Procedure Laterality Date  . APPENDECTOMY  ~1991   w/ TAH  . COLONOSCOPY  11/29/2009   @ [facility] - Diverticulosis, FHPolyps(m), 5 yr rpt per provider  . COLONOSCOPY  11/15/2017   Tubular adenoma of the colon  . HYSTERECTOMY  ~1991   TAH w/ BSO 2/2 benign tumor on Lt ovary  . PERCUTANEOUS BIOPSY BREAST W/NEEDLE LOCALIZATION Right 11/18/2017   w/ marker chip placement  . TUBAL LIGATION  ~1986     FAMILY HISTORY:      Family History  Problem Relation Age of Onset  . Diabetes type II Mother   . Hypothyroidism Mother   . High blood pressure (Hypertension) Mother   . Heart disease Mother   . Breast cancer Mother        s/p Lt mastectomy  . Melanoma Mother   . Hyperlipidemia (Elevated cholesterol) Mother   . Colon polyps Mother   . Stroke Mother   . Heart disease Father   . High blood pressure (Hypertension) Father   . Hyperlipidemia (Elevated cholesterol) Father      SOCIAL HISTORY: Social History          Socioeconomic History  . Marital status: Single    Spouse name: Not on file  . Number of children: 0  . Years of education: Not on file  . Highest education level: Not on file  Occupational History  . Not on file  Social Needs  . Financial resource strain: Not on file  . Food insecurity:    Worry: Not on file    Inability: Not on file  . Transportation needs:    Medical: Not on file    Non-medical: Not on file  Tobacco Use  . Smoking status: Never Smoker  . Smokeless  tobacco: Never Used  Substance and Sexual Activity  . Alcohol use: No  . Drug use: No  . Sexual activity: Never  Other Topics Concern  . Not on file  Social History Narrative   Grew up in [place].  Living w/ her mother in [place] since 10/16, as mother's health requires increased care giving & night time assistance.  Still has her residence in [place].  Plans to sell her house & move into mother's house full-time when mother passes.      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      PHYSICAL EXAM:    Vitals:   11/26/17  0806  BP: (!) 153/93  Pulse: 76  Temp: 36.7 C (98 F)   Body mass index is 29 kg/m. Weight: 65.1 kg (143 lb 9.6 oz)   GENERAL: Alert, active, oriented x3  HEENT: Pupils equal reactive to light. Extraocular movements are intact. Sclera clear. Palpebral conjunctiva normal red color.Pharynx clear.  NECK: Supple with no palpable mass and no adenopathy.  LUNGS: Sound clear with no rales rhonchi or wheezes.  HEART: Regular rhythm S1 and S2 without murmur.  BREAST:  The breast were examined bilaterally both supine and erect. There is no external appearance of architectural distortion. There are no significant skin abnormalities. Nipples appear normal. There is no palpable mass or unusual tenderness on either side. There is no axillary adenopathy.  ABDOMEN: Soft and depressible, nontender with no palpable mass, no hepatomegaly. Wounds dry and clean.  EXTREMITIES: Well-developed well-nourished symmetrical with no dependent edema.  NEUROLOGICAL: Awake alert oriented, facial expression symmetrical, moving all extremities.  REVIEW OF DATA: I have reviewed the following data today:      No visits with results within 3 Month(s) from this visit.  Latest known visit with results is:  Appointment on 05/06/2017  Component Date Value  . Hemoglobin A1C 05/06/2017 9.4*  . Average Blood Glucose (C* 05/06/2017 223    CBC and CMP from [facility] on 11/24/17 reviewed and are adequate for surgery.    Surgical Pathology CASE: [id number] PATIENT: Towanda Malkin Surgical Pathology Report  SPECIMEN SUBMITTED: A. Breast, right  CLINICAL HISTORY: 2.2 cm mass  PRE-OPERATIVE DIAGNOSIS: IMC  POST-OPERATIVE DIAGNOSIS: None provided.  DIAGNOSIS: A.RIGHT BREAST, 9:00, 10 CMFN; ULTRASOUND-GUIDED BIOPSY: - INVASIVE MAMMARY CARCINOMA.  Size of invasive carcinoma:0.6 cm in this sample Histologic grade of invasive carcinoma: Grade 2  Glandular/tubular differentiation score: 3  Nuclear pleomorphism score: 2  Mitotic rate score: 1  ASSESSMENT: Ms. Gosdin is a 72 y.o. female presenting for consultation for right breast cancer.    Patient with a right breast invasive mammary carcinoma, grade 2 ER/PR positive Her2 equivocal. The lesion is 2 cm localized at 10cm from areaola at 9 o'clock. The patient was oriented about the pathology results and the treatment alternatives. Surgical alternatives were discussed with patient (mastectomy vs lumpectomy) both including sentinel lymph node dissection. Patient refers that she is taking care of her mother basically 24/7. This is an important concern that the patient presented. I recommend partial mastectomy, needle guided with SLNBx since it has the same cure rate with the advantage of a faster recovery to be able to take care of her mother. Patient already evaluated by Oncologist. Case will be presented in Breast conferences on Monday for further discussion. Will coordinate surgery for 12/06/17 as per patient wishes. Risk of surgery were explained to patient and handout give.   PLAN: 1. Needle guided partial mastectomy of the right breast with sentinel lymph node biopsy ([id number], [id number]) 2. CBC, CMP - done 3. Internal Medicine clearance 4. Will discuss case on Breast conference on Monday 5. Continue Oncologist appointment 6. Do not take aspirin 5 days prior to surgery.    Patient verbalized understanding, all questions were answered, and were agreeable with the plan outlined above.   I spent >60 minutes on this encounter and >50% was orienting patient about the diagnosis of coordinating plan of care.   Herbert Pun, MD  Electronically signed by Herbert Pun, MD

## 2017-11-29 NOTE — H&P (View-Only) (Signed)
PATIENT PROFILE: Brianna Rogers is a 72 y.o. female who presents to the Clinic for consultation at the request of Dr. Dayton Martes for evaluation of right breast cancer.  PCP:  Sallee Lange, NP  HISTORY OF PRESENT ILLNESS: Brianna Rogers reports having regular screening mammography, since she cannot refers any symptoms from her breast since last mammography. Patient denies breast pain. Denies feeling a mass. Denies nipple retraction or drainage. Denies skin changes.   Last two mammograpies: Screening mammogram on 09/05/2016, negative and the last one 10/26/17 with a suspicious mass on the right breast at 9 o'clock. Biopsy of the mass revealed invasive mammary cancer. No lymph nodes seen on ultrasound.   First menstrual cycle at age 44 Last menstrual cycle ~72 years old after hysterectomy No pregnancies No hormonal therapies. 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  Pending core bx.      Diabetes mellitus, type 2 (CMS-HCC) Unknown   Overview    Followed by Dr. Eddie Dibbles      Microalbuminuric diabetic nephropathy (CMS-HCC) Unknown   Hypertension Unknown   Hyperlipidemia, mixed Unknown   Cardiac murmur, unspecified 09/22/2015   Overview    2/6 systolic best heard at 2nd Lt ICS         GENERAL REVIEW OF SYSTEMS:   General ROS: negative for - chills, fatigue, fever, weight gain or weight loss Allergy and Immunology ROS: negative for - hives  Hematological and Lymphatic ROS: negative for - bleeding problems or bruising, negative for palpable nodes Endocrine ROS: negative for - heat or cold intolerance, hair changes Respiratory ROS: negative for - cough, shortness  of breath or wheezing Cardiovascular ROS: no chest pain or palpitations GI ROS: negative for nausea, vomiting, abdominal pain, diarrhea, constipation Musculoskeletal ROS: negative for - joint swelling or muscle pain Neurological ROS: negative for - confusion, syncope Dermatological ROS: negative for pruritus and rash Psychiatric: negative for anxiety, depression, difficulty sleeping and memory loss  MEDICATIONS: CurrentMedications        Current Outpatient Medications  Medication Sig Dispense Refill  . CALCIUM ACETATE ORAL Take 1,200 mg by mouth 2 (two) times daily.    Marland Kitchen aspirin 81 MG EC tablet Take 81 mg by mouth once daily.    . flash glucose sensor (FREESTYLE LIBRE SENSOR) Kit Use 3 each every 10 (ten) days. 3 kit 11  . glimepiride (AMARYL) 4 MG tablet Take 1 tablet (4 mg total) by mouth every morning 90 tablet 3  . JARDIANCE 10 mg Tab TAKE 1 TABLET BY MOUTH EVERY DAY BEFORE BREAKFAST 30 tablet 5  . metFORMIN (GLUCOPHAGE) 1000 MG tablet Take 1 tablet (1,000 mg total) by mouth 2 (two) times daily with meals 180 tablet 1  . multivitamin tablet Take 1 tablet by mouth once daily.    Marland Kitchen omega-3 fatty acids-fish oil 360-1,200 mg Cap Take 1,200 mg by mouth 2 (two) times daily.    . pravastatin (PRAVACHOL) 80 MG tablet TAKE 1 TABLET(80 MG) BY MOUTH EVERY DAY 90 tablet 1  . quinapril (ACCUPRIL) 20 MG tablet TAKE 1 TABLET(20 MG) BY MOUTH EVERY DAY 90 tablet 1   No current facility-administered  medications for this visit.       ALLERGIES: Nitrofurantoin macrocrystal and Sulfa (sulfonamide antibiotics)  PAST MEDICAL HISTORY:     Past Medical History:  Diagnosis Date  . Breast mass, right 11/11/2017   Irreg hypoechoic mass 1.5x2.2x1.9 cm w/ negative axillary adenopathy.  Pending core bx.  . Cardiac murmur, unspecified 5093   2/6 systolic best heard at 2nd Lt ICS  . Cherry angioma   . Deviated septum    s/p "scraping" procedure yrs ago, but sx returned  . Diabetes  mellitus, type 2 (CMS-HCC) ~2003   Followed by Dr. Eddie Dibbles  . Diverticula of colon   . Hyperlipidemia, mixed ~2003  . Hypertension ~2003  . Microalbuminuric diabetic nephropathy (CMS-HCC)   . Osteopenia    Based on Dexa from 12/09/15  . Seborrheic keratoses   . Shingles 2008  . Vitamin D deficiency, unspecified 2014   Tx'd & resolved  . Wart    Tx'd w/ cryo x1 by Dermatology    PAST SURGICAL HISTORY:      Past Surgical History:  Procedure Laterality Date  . APPENDECTOMY  ~1991   w/ TAH  . COLONOSCOPY  11/29/2009   @ [facility] - Diverticulosis, FHPolyps(m), 5 yr rpt per provider  . COLONOSCOPY  11/15/2017   Tubular adenoma of the colon  . HYSTERECTOMY  ~1991   TAH w/ BSO 2/2 benign tumor on Lt ovary  . PERCUTANEOUS BIOPSY BREAST W/NEEDLE LOCALIZATION Right 11/18/2017   w/ marker chip placement  . TUBAL LIGATION  ~1986     FAMILY HISTORY:      Family History  Problem Relation Age of Onset  . Diabetes type II Mother   . Hypothyroidism Mother   . High blood pressure (Hypertension) Mother   . Heart disease Mother   . Breast cancer Mother        s/p Lt mastectomy  . Melanoma Mother   . Hyperlipidemia (Elevated cholesterol) Mother   . Colon polyps Mother   . Stroke Mother   . Heart disease Father   . High blood pressure (Hypertension) Father   . Hyperlipidemia (Elevated cholesterol) Father      SOCIAL HISTORY: Social History          Socioeconomic History  . Marital status: Single    Spouse name: Not on file  . Number of children: 0  . Years of education: Not on file  . Highest education level: Not on file  Occupational History  . Not on file  Social Needs  . Financial resource strain: Not on file  . Food insecurity:    Worry: Not on file    Inability: Not on file  . Transportation needs:    Medical: Not on file    Non-medical: Not on file  Tobacco Use  . Smoking status: Never Smoker  . Smokeless  tobacco: Never Used  Substance and Sexual Activity  . Alcohol use: No  . Drug use: No  . Sexual activity: Never  Other Topics Concern  . Not on file  Social History Narrative   Grew up in [place].  Living w/ her mother in [place] since 10/16, as mother's health requires increased care giving & night time assistance.  Still has her residence in [place].  Plans to sell her house & move into mother's house full-time when mother passes.      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      PHYSICAL EXAM:    Vitals:   11/26/17  0806  BP: (!) 153/93  Pulse: 76  Temp: 36.7 C (98 F)   Body mass index is 29 kg/m. Weight: 65.1 kg (143 lb 9.6 oz)   GENERAL: Alert, active, oriented x3  HEENT: Pupils equal reactive to light. Extraocular movements are intact. Sclera clear. Palpebral conjunctiva normal red color.Pharynx clear.  NECK: Supple with no palpable mass and no adenopathy.  LUNGS: Sound clear with no rales rhonchi or wheezes.  HEART: Regular rhythm S1 and S2 without murmur.  BREAST:  The breast were examined bilaterally both supine and erect. There is no external appearance of architectural distortion. There are no significant skin abnormalities. Nipples appear normal. There is no palpable mass or unusual tenderness on either side. There is no axillary adenopathy.  ABDOMEN: Soft and depressible, nontender with no palpable mass, no hepatomegaly. Wounds dry and clean.  EXTREMITIES: Well-developed well-nourished symmetrical with no dependent edema.  NEUROLOGICAL: Awake alert oriented, facial expression symmetrical, moving all extremities.  REVIEW OF DATA: I have reviewed the following data today:      No visits with results within 3 Month(s) from this visit.  Latest known visit with results is:  Appointment on 05/06/2017  Component Date Value  . Hemoglobin A1C 05/06/2017 9.4*  . Average Blood Glucose (C* 05/06/2017 223    CBC and CMP from [facility] on 11/24/17 reviewed and are adequate for surgery.    Surgical Pathology CASE: [id number] PATIENT: Towanda Malkin Surgical Pathology Report  SPECIMEN SUBMITTED: A. Breast, right  CLINICAL HISTORY: 2.2 cm mass  PRE-OPERATIVE DIAGNOSIS: IMC  POST-OPERATIVE DIAGNOSIS: None provided.  DIAGNOSIS: A.RIGHT BREAST, 9:00, 10 CMFN; ULTRASOUND-GUIDED BIOPSY: - INVASIVE MAMMARY CARCINOMA.  Size of invasive carcinoma:0.6 cm in this sample Histologic grade of invasive carcinoma: Grade 2  Glandular/tubular differentiation score: 3  Nuclear pleomorphism score: 2  Mitotic rate score: 1  ASSESSMENT: Ms. Gosdin is a 72 y.o. female presenting for consultation for right breast cancer.    Patient with a right breast invasive mammary carcinoma, grade 2 ER/PR positive Her2 equivocal. The lesion is 2 cm localized at 10cm from areaola at 9 o'clock. The patient was oriented about the pathology results and the treatment alternatives. Surgical alternatives were discussed with patient (mastectomy vs lumpectomy) both including sentinel lymph node dissection. Patient refers that she is taking care of her mother basically 24/7. This is an important concern that the patient presented. I recommend partial mastectomy, needle guided with SLNBx since it has the same cure rate with the advantage of a faster recovery to be able to take care of her mother. Patient already evaluated by Oncologist. Case will be presented in Breast conferences on Monday for further discussion. Will coordinate surgery for 12/06/17 as per patient wishes. Risk of surgery were explained to patient and handout give.   PLAN: 1. Needle guided partial mastectomy of the right breast with sentinel lymph node biopsy ([id number], [id number]) 2. CBC, CMP - done 3. Internal Medicine clearance 4. Will discuss case on Breast conference on Monday 5. Continue Oncologist appointment 6. Do not take aspirin 5 days prior to surgery.    Patient verbalized understanding, all questions were answered, and were agreeable with the plan outlined above.   I spent >60 minutes on this encounter and >50% was orienting patient about the diagnosis of coordinating plan of care.   Herbert Pun, MD  Electronically signed by Herbert Pun, MD

## 2017-11-30 ENCOUNTER — Encounter: Payer: Self-pay | Admitting: Hematology and Oncology

## 2017-11-30 ENCOUNTER — Inpatient Hospital Stay (HOSPITAL_BASED_OUTPATIENT_CLINIC_OR_DEPARTMENT_OTHER): Payer: Medicare Other | Admitting: Hematology and Oncology

## 2017-11-30 VITALS — BP 143/80 | HR 70 | Temp 97.6°F | Wt 142.0 lb

## 2017-11-30 DIAGNOSIS — Z170 Estrogen receptor positive status [ER+]: Secondary | ICD-10-CM

## 2017-11-30 DIAGNOSIS — Z803 Family history of malignant neoplasm of breast: Secondary | ICD-10-CM | POA: Diagnosis not present

## 2017-11-30 DIAGNOSIS — C50811 Malignant neoplasm of overlapping sites of right female breast: Secondary | ICD-10-CM

## 2017-11-30 DIAGNOSIS — C50411 Malignant neoplasm of upper-outer quadrant of right female breast: Secondary | ICD-10-CM

## 2017-11-30 NOTE — Progress Notes (Signed)
[facility]-  Cancer Center  Clinic day:  11/30/2017   Chief Complaint: Brianna Rogers is a 72 y.o. female with stage IB right breast cancer who is seen for review of work-up and discussion regarding direction of therapy.  HPI:  The patient was last seen in the medical oncology clinic on 11/24/2017 for initial consultation.  She had a clinical stage T2N0M0 right breast cancer s/p biopsy on 11/18/2017.  ER, PR, and Her2/neu were pending.  Labs revealed a normal CBC.  LFTs were normal.  CA27.29 was 24.7 (normal).  Calcium was 10.6.  PTH was 14 (15-65).  Protein was 8.6 (high).  Bilirubin was 1.3 (0.1 direct).  SPEP and free light chains were negative.  [employer] genetic testing was negative for BRCA1/2.  Symptomatically, she feels good.  She is scheduled for lumpectomy on 12/06/2017.   Past Medical History:  Diagnosis Date  . Breast cancer (HCC)   . Breast cancer, right (HCC) 11/18/2017  . Diabetes mellitus without complication (HCC)   . Heart murmur   . Hypertension     Past Surgical History:  Procedure Laterality Date  . ABDOMINAL HYSTERECTOMY    . APPENDECTOMY    . BREAST BIOPSY Right 11/18/2017   path pending  . COLONOSCOPY    . COLONOSCOPY WITH PROPOFOL N/A 11/15/2017   Procedure: COLONOSCOPY WITH PROPOFOL;  Surgeon: Skulskie, Martin U, MD;  Location: [facility] ENDOSCOPY;  Service: Endoscopy;  Laterality: N/A;  . TUBAL LIGATION      Family History  Problem Relation Age of Onset  . Breast cancer Mother 60  . Cancer Mother   . Cancer Maternal Aunt   . Cancer Maternal Grandmother     Social History:  reports that  has never smoked. she has never used smokeless tobacco. She reports that she does not drink alcohol or use drugs.  She is an only child.  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  She travels every other day to her home in [place] to take care of her animals, but is planning to move back to [place]  soon. Patient is retired from [employer]. The patient is alone today.  Allergies:  Allergies  Allergen Reactions  . Bactrim [Sulfamethoxazole-Trimethoprim] Other (See Comments)    Unknown  . Macrodantin [Nitrofurantoin Macrocrystal] Other (See Comments)    Unknown  . Sulfa Antibiotics Other (See Comments)    Flu like symptoms    Current Medications: Current Outpatient Medications  Medication Sig Dispense Refill  . acetaminophen (TYLENOL) 500 MG tablet Take 500 mg by mouth every 6 (six) hours as needed for moderate pain or headache.    . aspirin EC 81 MG tablet Take 81 mg by mouth every evening.     . empagliflozin (JARDIANCE) 10 MG TABS tablet Take 10 mg by mouth daily.    . glimepiride (AMARYL) 4 MG tablet Take 4 mg by mouth daily with breakfast.    . ibuprofen (ADVIL,MOTRIN) 200 MG tablet Take 200 mg by mouth every 6 (six) hours as needed for headache or moderate pain.    . metFORMIN (GLUCOPHAGE) 1000 MG tablet Take 1,000 mg by mouth 2 (two) times daily with a meal.    . Multiple Vitamin (MULTIVITAMIN) tablet Take 1 tablet by mouth daily.    . Omega-3 Fatty Acids (FISH OIL) 1200 MG CAPS Take 2,400 mg by mouth 2 (two) times daily.    . pravastatin (PRAVACHOL) 80 MG tablet Take   80 mg by mouth every evening.     . quinapril (ACCUPRIL) 20 MG tablet Take 20 mg by mouth daily.      No current facility-administered medications for this visit.     Review of Systems:  GENERAL:  Feels good.  No fevers, sweats or weight loss. PERFORMANCE STATUS (ECOG):  0 HEENT:  No visual changes, runny nose, sore throat, mouth sores or tenderness. Lungs: No shortness of breath or cough.  No hemoptysis. Cardiac:  No chest pain, palpitations, orthopnea, or PND. GI:  No nausea, vomiting, diarrhea, constipation, melena or hematochezia. GU:  No urgency, frequency, dysuria, or hematuria. Musculoskeletal:  Osteopenia.  No back pain.  No muscle tenderness. Extremities:  No pain or swelling. Skin:  Bruising  with discomfort after breast biopsy.  Abdominal rash, plans to see dermatologist. Neuro:  No headache, numbness or weakness, balance or coordination issues. Endocrine:  No diabetes, thyroid issues, hot flashes or night sweats. Psych:  No mood changes, depression or anxiety. Pain:  No focal pain. Review of systems:  All other systems reviewed and found to be negative.  Physical Exam: Blood pressure (!) 143/80, pulse 70, temperature 97.6 F (36.4 C), temperature source Tympanic, weight 142 lb (64.4 kg). GENERAL:  Well developed, well nourished, woman sitting comfortably in the exam room in no acute distress. MENTAL STATUS:  Alert and oriented to person, place and time. HEAD:  Styled gray hair.  Normocephalic, atraumatic, face symmetric, no Cushingoid features. EYES:  Glasses.  Brown eyes. No conjunctivitis or scleral icterus.  NEUROLOGICAL: Unremarkable. PSYCH:  Appropriate.   No visits with results within 3 Day(s) from this visit.  Latest known visit with results is:  Office Visit on 11/24/2017  Component Date Value Ref Range Status  . Sodium 11/24/2017 136  135 - 145 mmol/L Final  . Potassium 11/24/2017 3.8  3.5 - 5.1 mmol/L Final  . Chloride 11/24/2017 99* 101 - 111 mmol/L Final  . CO2 11/24/2017 26  22 - 32 mmol/L Final  . Glucose, Bld 11/24/2017 163* 65 - 99 mg/dL Final  . BUN 11/24/2017 19  6 - 20 mg/dL Final  . Creatinine, Ser 11/24/2017 0.82  0.44 - 1.00 mg/dL Final  . Calcium 11/24/2017 10.6* 8.9 - 10.3 mg/dL Final  . Total Protein 11/24/2017 8.6* 6.5 - 8.1 g/dL Final  . Albumin 11/24/2017 4.8  3.5 - 5.0 g/dL Final  . AST 11/24/2017 21  15 - 41 U/L Final  . ALT 11/24/2017 21  14 - 54 U/L Final  . Alkaline Phosphatase 11/24/2017 65  38 - 126 U/L Final  . Total Bilirubin 11/24/2017 1.3* 0.3 - 1.2 mg/dL Final  . GFR calc non Af Amer 11/24/2017 >60  >60 mL/min Final  . GFR calc Af Amer 11/24/2017 >60  >60 mL/min Final   Comment: (NOTE) The eGFR has been calculated using the  CKD EPI equation. This calculation has not been validated in all clinical situations. eGFR's persistently <60 mL/min signify possible Chronic Kidney Disease.   Georgiann Hahn gap 11/24/2017 11  5 - 15 Final   Performed at [facility], [address] [place], [place] [address]  . WBC 11/24/2017 8.8  3.6 - 11.0 K/uL Final  . RBC 11/24/2017 5.21* 3.80 - 5.20 MIL/uL Final  . Hemoglobin 11/24/2017 15.9  12.0 - 16.0 g/dL Final  . HCT 11/24/2017 46.4  35.0 - 47.0 % Final  . MCV 11/24/2017 89.2  80.0 - 100.0 fL Final  . MCH 11/24/2017 30.5  26.0 - 34.0 pg Final  . MCHC 11/24/2017 34.2  32.0 - 36.0 g/dL Final  . RDW 11/24/2017 13.6  11.5 - 14.5 % Final  . Platelets 11/24/2017 223  150 - 440 K/uL Final  . Neutrophils Relative % 11/24/2017 70  % Final  . Neutro Abs 11/24/2017 6.1  1.4 - 6.5 K/uL Final  . Lymphocytes Relative 11/24/2017 21  % Final  . Lymphs Abs 11/24/2017 1.9  1.0 - 3.6 K/uL Final  . Monocytes Relative 11/24/2017 6  % Final  . Monocytes Absolute 11/24/2017 0.6  0.2 - 0.9 K/uL Final  . Eosinophils Relative 11/24/2017 2  % Final  . Eosinophils Absolute 11/24/2017 0.2  0 - 0.7 K/uL Final  . Basophils Relative 11/24/2017 1  % Final  . Basophils Absolute 11/24/2017 0.1  0 - 0.1 K/uL Final   Performed at [place] [facility], [address] [place], [place] [address]  . CA 27.29 11/24/2017 24.7  0.0 - 38.6 U/mL Final   Comment: (NOTE) Siemens Centaur Immunochemiluminometric Methodology (ICMA) Values obtained with different assay methods or kits cannot be used interchangeably. Results cannot be interpreted as absolute evidence of the presence or absence of malignant disease. Performed At: [facility] [address] [place], [place] [id number] Nagendra Sanjai MD Ph:[phone] Performed at [facility] [facility], [address] [place], [place] [address]   . Kappa free light chain 11/24/2017 15.0  3.3 - 19.4 mg/L Final  . Lamda free light chains  11/24/2017 10.8  5.7 - 26.3 mg/L Final  . Kappa, lamda light chain ratio 11/24/2017 1.39  0.26 - 1.65 Final   Comment: (NOTE) Performed At: [facility] [address] [place], [place] [id number] Nagendra Sanjai MD Ph:[phone] Performed at [place] [facility], [address] [place], [place] [address]   . Bilirubin, Direct 11/24/2017 0.1  0.1 - 0.5 mg/dL Final   Performed at [place] [facility], [address] [place], [place] [address]  . IgG (Immunoglobin G), Serum 11/24/2017 784  700 - 1,600 mg/dL Final  . IgA 11/24/2017 272  87 - 352 mg/dL Final  . IgM (Immunoglobulin M), Srm 11/24/2017 77  26 - 217 mg/dL Final  . Total Protein ELP 11/24/2017 7.2  6.0 - 8.5 g/dL Corrected  . Albumin SerPl Elph-Mcnc 11/24/2017 4.0  2.9 - 4.4 g/dL Corrected  . Alpha 1 11/24/2017 0.2  0.0 - 0.4 g/dL Corrected  . Alpha2 Glob SerPl Elph-Mcnc 11/24/2017 0.9  0.4 - 1.0 g/dL Corrected  . B-Globulin SerPl Elph-Mcnc 11/24/2017 1.3  0.7 - 1.3 g/dL Corrected  . Gamma Glob SerPl Elph-Mcnc 11/24/2017 0.8  0.4 - 1.8 g/dL Corrected  . M Protein SerPl Elph-Mcnc 11/24/2017 Not Observed  Not Observed g/dL Corrected  . Globulin, Total 11/24/2017 3.2  2.2 - 3.9 g/dL Corrected  . Albumin/Glob SerPl 11/24/2017 1.3  0.7 - 1.7 Corrected  . IFE 1 11/24/2017 Comment   Corrected   An apparent normal immunofixation pattern.  . Please Note 11/24/2017 Comment   Corrected   Comment: (NOTE) Protein electrophoresis scan will follow via computer, mail, or courier delivery. Performed At: [facility] [place] [address] [place], [place] [id number] Nagendra Sanjai MD Ph:[phone] Performed at [facility], [address] [place], [place] [address]   . PTH 11/24/2017 14* 15 - 65 pg/mL Final  . Calcium, Total (PTH) 11/24/2017 10.4* 8.7 - 10.3 mg/dL Final  . PTH Interp 11/24/2017 Comment   Final   Comment: (NOTE) Interpretation                   Intact PTH    Calcium                                 (pg/mL)      (mg/dL) Normal                          15 - 65     8.6 - 10.2 Primary Hyperparathyroidism         >65          >10.2 Secondary Hyperparathyroidism       >65          <10.2 Non-Parathyroid Hypercalcemia       <65          >10.2 Hypoparathyroidism                  <15          < 8.6 Non-Parathyroid Hypocalcemia    15 - 65          < 8.6 Performed At: [facility] [address] [place], [place] [id number] Nagendra Sanjai MD Ph:[phone] Performed at [place] [facility], [address] [place], [place] [address]     Assessment:  Remell Ellen Olafson is a 72 y.o. female with clinical stage T2N0M0 right breast cancer s/p biopsy on 11/18/2017.  Pathology revealed grade II invasive mammary carcinoma. Tumor was ER + (> 90%), PR + (>90%), and Her2/neu 2+ (FISH negative).  Diagnostic right mammogram and ultrasound on 11/11/2017 revealed a suspicious mass in the 9 o'clock position of the right breast.  Targeted ultrasound revealed a 1.5 x 2.2 x 1.9 cm mass with posterior acoustic shadowing 10 cm from the nipple.  The right axilla was negative for adenopathy.  Bone density on 12/09/2015 revealed osteopenia with a T-score of -1.3 in the left hip.  She has a family history of breast cancer x 4 (mother and 3 paternal aunts) and "female cancer" (maternal great grandmother).  [employer] genetic testing was negative for BRCA1/2.  Symptomatically, she feels well.  Exam reveals a 2 cm mass in the right breast with no palpable axillary adenopathy.    Plan: 1.  Discuss labs from last visit. CBC, LFTs, and CA27.29 were normal.  Slightly elevated bilirubin (1.3) with direct bilirubin 0.1 (normal).  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 Slightly elevated calcium with low PTH.  No evidence of hyperparathyroidism.  Continue holding calcium. 2.  Discuss tumor is ER/PR + and Her2/neu -.  Discuss plan for hormonal therapy (tamoxifen versus an aromatase inhibitor). 3.  Discuss genetic testing-  BRCA1/2 negative.  Copy provided patient. 4.  Discuss tumor board conversation.  Lumpectomy scheduled for 12/06/2017. Send OncoType DX on surgery specimen. 5.  Follow-up bone density study on 12/14/2017. 6.  RTC on 12/20/2017 for MD assessment and review of pathology.    , MD 11/30/2017, 4:35 PM  

## 2017-11-30 NOTE — Progress Notes (Signed)
Patient here today to discuss plan of treatment.  Saw her surgeon Friday.  Surgery is planned for March 18th.

## 2017-12-01 ENCOUNTER — Other Ambulatory Visit: Payer: Self-pay | Admitting: General Surgery

## 2017-12-01 ENCOUNTER — Encounter: Payer: Self-pay | Admitting: Hematology and Oncology

## 2017-12-01 DIAGNOSIS — C50411 Malignant neoplasm of upper-outer quadrant of right female breast: Secondary | ICD-10-CM

## 2017-12-01 DIAGNOSIS — Z170 Estrogen receptor positive status [ER+]: Principal | ICD-10-CM

## 2017-12-02 ENCOUNTER — Other Ambulatory Visit: Payer: Self-pay

## 2017-12-02 ENCOUNTER — Encounter
Admission: RE | Admit: 2017-12-02 | Discharge: 2017-12-02 | Disposition: A | Discharge status: Home / Self Care | Payer: Medicare Other | Source: Ambulatory Visit | Attending: General Surgery | Admitting: General Surgery

## 2017-12-02 DIAGNOSIS — E119 Type 2 diabetes mellitus without complications: Secondary | ICD-10-CM | POA: Diagnosis not present

## 2017-12-02 DIAGNOSIS — I10 Essential (primary) hypertension: Secondary | ICD-10-CM | POA: Diagnosis not present

## 2017-12-02 DIAGNOSIS — Z01810 Encounter for preprocedural cardiovascular examination: Secondary | ICD-10-CM | POA: Diagnosis not present

## 2017-12-02 NOTE — Patient Instructions (Signed)
Your procedure is scheduled on:  Monday 12/06/17 Report to [facility] AT 8:15 AM   Remember: Instructions that are not followed completely may result in serious medical risk, up to and including death, or upon the discretion of your surgeon and anesthesiologist your surgery may need to be rescheduled.     _X__ 1. Do not eat food after midnight the night before your procedure.                 No gum chewing or hard candies. You may drink clear liquids up to 2 hours                 before you are scheduled to arrive for your surgery- DO not drink clear                 liquids within 2 hours of the start of your surgery.                 Clear Liquids include:  water, apple juice without pulp, clear carbohydrate                 drink such as Clearfast or Gatorade, Black Coffee or Tea (Do not add                 anything to coffee or tea).  __X__2.  On the morning of surgery brush your teeth with toothpaste and water, you                 may rinse your mouth with mouthwash if you wish.  Do not swallow any              toothpaste of mouthwash.     _X__ 3.  No Alcohol for 24 hours before or after surgery.   _X__ 4.  Do Not Smoke or use e-cigarettes For 24 Hours Prior to Your Surgery.                 Do not use any chewable tobacco products for at least 6 hours prior to                 surgery.  ____  5.  Bring all medications with you on the day of surgery if instructed.   __X__  6.  Notify your doctor if there is any change in your medical condition      (cold, fever, infections).     Do not wear jewelry, make-up, hairpins, clips or nail polish. Do not wear lotions, powders, or perfumes.  Do not shave 48 hours prior to surgery. Men may shave face and neck. Do not bring valuables to the hospital.    [facility] is not responsible for any belongings or valuables.  Contacts, dentures/partials or body piercings may not be worn into surgery. Bring a case for your contacts, glasses  or hearing aids, a denture cup will be supplied. Leave your suitcase in the car. After surgery it may be brought to your room. For patients admitted to the hospital, discharge time is determined by your treatment team.   Patients discharged the day of surgery will not be allowed to drive home.   Please read over the following fact sheets that you were given:   MRSA Information  __X__ Take these medicines the morning of surgery with A SIP OF WATER:    1. NONE  2.   3.   4.  5.  6.  ____ Fleet Enema (as  directed)   __X__ Use CHG Soap/SAGE wipes as directed  ____ Use inhalers on the day of surgery  __X__ Stop metformin/Janumet/Farxiga 2 days prior to surgery    ____ Take 1/2 of usual insulin dose the night before surgery. No insulin the morning          of surgery.   __X__ Stop Blood Thinners Coumadin/Plavix/Xarelto/Pleta/Pradaxa/Eliquis/Effient/Aspirin  on   Or contact your Surgeon, Cardiologist or Medical Doctor regarding  ability to stop your blood thinners  __X__ Stop Anti-inflammatories 7 days before surgery such as Advil, Ibuprofen, Motrin,  BC or Goodies Powder, Naprosyn, Naproxen, Aleve, Aspirin    __X__ Stop all herbal supplements, fish oil or vitamin E until after surgery.    ____ Bring C-Pap to the hospital.

## 2017-12-05 MED ORDER — CEFAZOLIN SODIUM-DEXTROSE 2-4 GM/100ML-% IV SOLN
2.0000 g | INTRAVENOUS | Status: AC
  Administered 2017-12-06: 2 g via INTRAVENOUS

## 2017-12-06 ENCOUNTER — Ambulatory Visit
Admission: RE | Admit: 2017-12-06 | Discharge: 2017-12-06 | Disposition: A | Discharge status: Home / Self Care | Payer: Medicare Other | Source: Ambulatory Visit | Attending: General Surgery | Admitting: General Surgery

## 2017-12-06 ENCOUNTER — Ambulatory Visit: Payer: Medicare Other | Admitting: Anesthesiology

## 2017-12-06 ENCOUNTER — Encounter: Payer: Self-pay | Admitting: *Deleted

## 2017-12-06 ENCOUNTER — Encounter: Payer: Self-pay | Admitting: Anesthesiology

## 2017-12-06 ENCOUNTER — Other Ambulatory Visit: Payer: Self-pay

## 2017-12-06 ENCOUNTER — Encounter
Admission: RE | Disposition: A | Discharge status: Home / Self Care | Payer: Self-pay | Source: Ambulatory Visit | Attending: General Surgery

## 2017-12-06 DIAGNOSIS — C50811 Malignant neoplasm of overlapping sites of right female breast: Secondary | ICD-10-CM | POA: Insufficient documentation

## 2017-12-06 DIAGNOSIS — Z803 Family history of malignant neoplasm of breast: Secondary | ICD-10-CM | POA: Insufficient documentation

## 2017-12-06 DIAGNOSIS — I10 Essential (primary) hypertension: Secondary | ICD-10-CM | POA: Insufficient documentation

## 2017-12-06 DIAGNOSIS — Z170 Estrogen receptor positive status [ER+]: Principal | ICD-10-CM

## 2017-12-06 DIAGNOSIS — E1121 Type 2 diabetes mellitus with diabetic nephropathy: Secondary | ICD-10-CM | POA: Insufficient documentation

## 2017-12-06 DIAGNOSIS — E782 Mixed hyperlipidemia: Secondary | ICD-10-CM | POA: Insufficient documentation

## 2017-12-06 DIAGNOSIS — C50411 Malignant neoplasm of upper-outer quadrant of right female breast: Secondary | ICD-10-CM

## 2017-12-06 DIAGNOSIS — Z7982 Long term (current) use of aspirin: Secondary | ICD-10-CM | POA: Insufficient documentation

## 2017-12-06 DIAGNOSIS — Z79899 Other long term (current) drug therapy: Secondary | ICD-10-CM | POA: Insufficient documentation

## 2017-12-06 DIAGNOSIS — Z7984 Long term (current) use of oral hypoglycemic drugs: Secondary | ICD-10-CM | POA: Insufficient documentation

## 2017-12-06 HISTORY — PX: BREAST LUMPECTOMY: SHX2

## 2017-12-06 HISTORY — PX: PARTIAL MASTECTOMY WITH NEEDLE LOCALIZATION: SHX6008

## 2017-12-06 HISTORY — PX: SENTINEL NODE BIOPSY: SHX6608

## 2017-12-06 LAB — GLUCOSE, CAPILLARY
Glucose-Capillary: 213 mg/dL — ABNORMAL HIGH (ref 65–99)
Glucose-Capillary: 161 mg/dL — ABNORMAL HIGH (ref 65–99)

## 2017-12-06 SURGERY — PARTIAL MASTECTOMY WITH NEEDLE LOCALIZATION
Anesthesia: General | Laterality: Right | Wound class: Clean

## 2017-12-06 MED ORDER — OXYCODONE HCL 5 MG PO TABS: 5.0000 mg | ORAL_TABLET | Freq: Once | ORAL | Status: DC | PRN

## 2017-12-06 MED ORDER — DEXAMETHASONE SODIUM PHOSPHATE 10 MG/ML IJ SOLN
INTRAMUSCULAR | Status: DC | PRN
  Administered 2017-12-06: 6 mg via INTRAVENOUS

## 2017-12-06 MED ORDER — FAMOTIDINE 20 MG PO TABS
ORAL_TABLET | ORAL | Status: AC
  Administered 2017-12-06: 20 mg via ORAL
  Filled 2017-12-06: qty 1

## 2017-12-06 MED ORDER — FENTANYL CITRATE (PF) 100 MCG/2ML IJ SOLN
INTRAMUSCULAR | Status: DC | PRN
  Administered 2017-12-06 (×2): 50 ug via INTRAVENOUS

## 2017-12-06 MED ORDER — FENTANYL CITRATE (PF) 100 MCG/2ML IJ SOLN
INTRAMUSCULAR | Status: AC
  Filled 2017-12-06: qty 2

## 2017-12-06 MED ORDER — PROPOFOL 10 MG/ML IV BOLUS
INTRAVENOUS | Status: DC | PRN
  Administered 2017-12-06: 120 mg via INTRAVENOUS

## 2017-12-06 MED ORDER — MIDAZOLAM HCL 2 MG/2ML IJ SOLN
INTRAMUSCULAR | Status: DC | PRN
  Administered 2017-12-06: 2 mg via INTRAVENOUS

## 2017-12-06 MED ORDER — CEFAZOLIN SODIUM-DEXTROSE 2-4 GM/100ML-% IV SOLN
INTRAVENOUS | Status: AC
  Filled 2017-12-06: qty 100

## 2017-12-06 MED ORDER — FENTANYL CITRATE (PF) 100 MCG/2ML IJ SOLN
INTRAMUSCULAR | Status: AC
  Administered 2017-12-06: 25 ug via INTRAVENOUS
  Filled 2017-12-06: qty 2

## 2017-12-06 MED ORDER — SEVOFLURANE IN SOLN
RESPIRATORY_TRACT | Status: AC
  Filled 2017-12-06: qty 250

## 2017-12-06 MED ORDER — OXYCODONE HCL 5 MG/5ML PO SOLN: 5.0000 mg | Freq: Once | ORAL | Status: DC | PRN

## 2017-12-06 MED ORDER — FAMOTIDINE 20 MG PO TABS
20.0000 mg | ORAL_TABLET | Freq: Once | ORAL | Status: AC
  Administered 2017-12-06: 20 mg via ORAL

## 2017-12-06 MED ORDER — PROPOFOL 10 MG/ML IV BOLUS
INTRAVENOUS | Status: AC
  Filled 2017-12-06: qty 20

## 2017-12-06 MED ORDER — ONDANSETRON HCL 4 MG/2ML IJ SOLN
INTRAMUSCULAR | Status: DC | PRN
  Administered 2017-12-06: 4 mg via INTRAVENOUS

## 2017-12-06 MED ORDER — LIDOCAINE HCL (CARDIAC) 20 MG/ML IV SOLN
INTRAVENOUS | Status: DC | PRN
  Administered 2017-12-06: 100 mg via INTRAVENOUS

## 2017-12-06 MED ORDER — MIDAZOLAM HCL 2 MG/2ML IJ SOLN
INTRAMUSCULAR | Status: AC
  Filled 2017-12-06: qty 2

## 2017-12-06 MED ORDER — TECHNETIUM TC 99M SULFUR COLLOID FILTERED
0.7650 | Freq: Once | INTRAVENOUS | Status: AC | PRN
  Administered 2017-12-06: 0.765 via INTRADERMAL

## 2017-12-06 MED ORDER — BUPIVACAINE-EPINEPHRINE (PF) 0.5% -1:200000 IJ SOLN
INTRAMUSCULAR | Status: AC
  Filled 2017-12-06: qty 30

## 2017-12-06 MED ORDER — BUPIVACAINE-EPINEPHRINE 0.5% -1:200000 IJ SOLN
INTRAMUSCULAR | Status: DC | PRN
  Administered 2017-12-06: 8 mL

## 2017-12-06 MED ORDER — TRAMADOL HCL 50 MG PO TABS: 50.0000 mg | ORAL_TABLET | Freq: Four times a day (QID) | ORAL | 0 refills | Status: AC | PRN

## 2017-12-06 MED ORDER — ONDANSETRON HCL 4 MG/2ML IJ SOLN
4.0000 mg | Freq: Once | INTRAMUSCULAR | Status: AC
  Administered 2017-12-06: 4 mg via INTRAVENOUS

## 2017-12-06 MED ORDER — SODIUM CHLORIDE 0.9 % IV SOLN
INTRAVENOUS | Status: DC
  Administered 2017-12-06 (×2): via INTRAVENOUS

## 2017-12-06 MED ORDER — ONDANSETRON HCL 4 MG/2ML IJ SOLN
INTRAMUSCULAR | Status: AC
  Filled 2017-12-06: qty 2

## 2017-12-06 MED ORDER — FENTANYL CITRATE (PF) 100 MCG/2ML IJ SOLN
25.0000 ug | INTRAMUSCULAR | Status: DC | PRN
  Administered 2017-12-06 (×4): 25 ug via INTRAVENOUS

## 2017-12-06 MED ORDER — LACTATED RINGERS IV SOLN
INTRAVENOUS | Status: DC | PRN
  Administered 2017-12-06: 12:00:00 via INTRAVENOUS

## 2017-12-06 SURGICAL SUPPLY — 29 items
BLADE SURG 15 STRL LF DISP TIS (BLADE) ×1 IMPLANT
BLADE SURG 15 STRL SS (BLADE) ×2
CANISTER SUCT 1200ML W/VALVE (MISCELLANEOUS) ×3 IMPLANT
CHLORAPREP W/TINT 26ML (MISCELLANEOUS) ×3 IMPLANT
DERMABOND ADVANCED (GAUZE/BANDAGES/DRESSINGS) ×2
DERMABOND ADVANCED .7 DNX12 (GAUZE/BANDAGES/DRESSINGS) ×1 IMPLANT
DEVICE DUBIN SPECIMEN MAMMOGRA (MISCELLANEOUS) ×3 IMPLANT
DRAPE LAPAROTOMY 77X122 PED (DRAPES) ×3 IMPLANT
ELECT REM PT RETURN 9FT ADLT (ELECTROSURGICAL) ×3
ELECTRODE REM PT RTRN 9FT ADLT (ELECTROSURGICAL) ×1 IMPLANT
GLOVE BIO SURGEON STRL SZ 6.5 (GLOVE) ×2 IMPLANT
GLOVE BIO SURGEONS STRL SZ 6.5 (GLOVE) ×1
GOWN STRL REUS W/ TWL LRG LVL3 (GOWN DISPOSABLE) ×2 IMPLANT
GOWN STRL REUS W/TWL LRG LVL3 (GOWN DISPOSABLE) ×4
KIT TURNOVER KIT A (KITS) ×3 IMPLANT
LABEL OR SOLS (LABEL) ×3 IMPLANT
MARGIN MAP 10MM (MISCELLANEOUS) ×3 IMPLANT
NEEDLE HYPO 25X1 1.5 SAFETY (NEEDLE) ×3 IMPLANT
PACK BASIN MINOR ARMC (MISCELLANEOUS) ×3 IMPLANT
SUT ETHILON 3-0 FS-10 30 BLK (SUTURE) ×3
SUT MNCRL 4-0 (SUTURE) ×2
SUT MNCRL 4-0 27XMFL (SUTURE) ×1
SUT SILK 2 0 SH (SUTURE) IMPLANT
SUT VIC AB 3-0 SH 27 (SUTURE) ×2
SUT VIC AB 3-0 SH 27X BRD (SUTURE) ×1 IMPLANT
SUTURE EHLN 3-0 FS-10 30 BLK (SUTURE) ×1 IMPLANT
SUTURE MNCRL 4-0 27XMF (SUTURE) ×1 IMPLANT
SYR 10ML LL (SYRINGE) ×3 IMPLANT
WATER STERILE IRR 1000ML POUR (IV SOLUTION) ×3 IMPLANT

## 2017-12-06 NOTE — Interval H&P Note (Signed)
History and Physical Interval Note:  12/06/2017 11:41 AM  Margie Billet  has presented today for surgery, with the diagnosis of MALIGNANT NEOPLASM RIGHT BREAST  The various methods of treatment have been discussed with the patient and family. After consideration of risks, benefits and other options for treatment, the patient has consented to  Procedure(s): PARTIAL MASTECTOMY WITH NEEDLE LOCALIZATION (Right) SENTINEL NODE BIOPSY (Right) as a surgical intervention .  The patient's history has been reviewed, patient examined, no change in status, stable for surgery.  I have reviewed the patient's chart and labs.  Right chest marked in the pre procedure room. Questions were answered to the patient's satisfaction.     Herbert Pun

## 2017-12-06 NOTE — Progress Notes (Signed)
Up to bathroom to void  Vomited small amount clear liquid

## 2017-12-06 NOTE — Anesthesia Post-op Follow-up Note (Signed)
Anesthesia QCDR form completed.        

## 2017-12-06 NOTE — Anesthesia Preprocedure Evaluation (Signed)
Anesthesia Evaluation  Patient identified by MRN, date of birth, ID band Patient awake    Reviewed: Allergy & Precautions, H&P , NPO status , Patient's Chart, lab work & pertinent test results  History of Anesthesia Complications Negative for: history of anesthetic complications  Airway Mallampati: III  TM Distance: >3 FB Neck ROM: limited    Dental  (+) Chipped   Pulmonary neg pulmonary ROS, neg shortness of breath,           Cardiovascular Exercise Tolerance: Good hypertension, (-) angina(-) Past MI and (-) DOE + Valvular Problems/Murmurs      Neuro/Psych negative neurological ROS  negative psych ROS   GI/Hepatic negative GI ROS, Neg liver ROS, neg GERD  ,  Endo/Other  diabetes, Type 2  Renal/GU negative Renal ROS  negative genitourinary   Musculoskeletal   Abdominal   Peds  Hematology negative hematology ROS (+)   Anesthesia Other Findings Past Medical History: No date: Diabetes mellitus without complication (HCC) No date: Heart murmur No date: Hypertension  Past Surgical History: No date: ABDOMINAL HYSTERECTOMY No date: APPENDECTOMY No date: COLONOSCOPY No date: TUBAL LIGATION     Reproductive/Obstetrics negative OB ROS                             Anesthesia Physical  Anesthesia Plan  ASA: III  Anesthesia Plan: General LMA   Post-op Pain Management:    Induction: Intravenous  PONV Risk Score and Plan: Dexamethasone and Ondansetron  Airway Management Planned: Natural Airway and Nasal Cannula  Additional Equipment:   Intra-op Plan:   Post-operative Plan: Extubation in OR  Informed Consent: I have reviewed the patients History and Physical, chart, labs and discussed the procedure including the risks, benefits and alternatives for the proposed anesthesia with the patient or authorized representative who has indicated his/her understanding and acceptance.   Dental  Advisory Given  Plan Discussed with: Anesthesiologist, CRNA and Surgeon  Anesthesia Plan Comments: (Patient consented for risks of anesthesia including but not limited to:  - adverse reactions to medications - risk of intubation if required - damage to teeth, lips or other oral mucosa - sore throat or hoarseness - Damage to heart, brain, lungs or loss of life  Patient voiced understanding.)        Anesthesia Quick Evaluation

## 2017-12-06 NOTE — Discharge Instructions (Signed)
°  Diet: Resume home heart healthy regular diet.   Activity: No heavy lifting > 20 pounds (children, pets, laundry, garbage) or strenuous activity until follow-up with the affected extremity, but light activity and walking are encouraged. Do not drive or drink alcohol if taking narcotic pain medications. Do not drive until feeling completely pain free and able to move your right upper extremity without difficulty or discomfort.   Wound care: May shower with soapy water and pat dry (do not rub incisions), but no baths or submerging incision underwater until follow-up. (no swimming)   Medications: Resume all home medications except Aspirin. May restart taking aspirin on Wednesday 12/08/17.  For mild to moderate pain: acetaminophen (Tylenol) or ibuprofen (if no kidney disease). Combining Tylenol with alcohol can substantially increase your risk of causing liver disease. Narcotic pain medications, if prescribed, can be used for severe pain, though may cause nausea, constipation, and drowsiness. If you do not need the narcotic pain medication, you do not need to fill the prescription.  Call office [phone]) at any time if any questions, worsening pain, fevers/chills, bleeding, drainage from incision site, or other concerns.

## 2017-12-06 NOTE — Transfer of Care (Signed)
Immediate Anesthesia Transfer of Care Note  Patient: Brianna Rogers  Procedure(s) Performed: PARTIAL MASTECTOMY WITH NEEDLE LOCALIZATION (Right ) SENTINEL NODE BIOPSY (Right )  Patient Location: PACU  Anesthesia Type:General  Level of Consciousness: awake, alert  and oriented  Airway & Oxygen Therapy: Patient Spontanous Breathing  Post-op Assessment: Report given to RN  Post vital signs: Reviewed and stable  Last Vitals:  Vitals:   12/06/17 0944  BP: (!) 145/83  Pulse: 73  Resp: 16  Temp: 36.8 C  SpO2: 100%    Last Pain:  Vitals:   12/06/17 0944  TempSrc: Tympanic         Complications: No apparent anesthesia complications

## 2017-12-06 NOTE — Op Note (Signed)
Preoperative diagnosis: Right breast carcinoma.  Postoperative diagnosis: Right breast carcinoma.   Procedure: Right needle-localized breast lumpectomy.                       Right Axillary Sentinel Lymph node biopsy  Anesthesia: GETA  Surgeon: Dr. Windell Moment  Wound Classification: Clean  Indications: Patient is a 72 y.o. female with a nonpalpable right breast mass noted on mammography with core biopsy demonstrating invasive mammary carcinoma requires needle-localized lumpectomy for treatment with sentinel lymph node biopsy for staging.   Findings: 1. Specimen mammography shows marker and wire on specimen 2. Pathology call refers gross examination of margins was grossly negative, mass abutting the skin and caudal margin  3. No other palpable mass or lymph node identified.   Description of procedure: Preoperative needle localization was performed by radiology. In the nuclear medicine suite, the subareolar region was injected with Tc-99 sulfur colloid. Localization studies were reviewed. The patient was taken to the operating room and placed supine on the operating table, and after general anesthesia the right chest and axilla were prepped and draped in the usual sterile fashion. A time-out was completed verifying correct patient, procedure, site, positioning, and implant(s) and/or special equipment prior to beginning this procedure.  By comparing the localization studies with the direction and skin entry site of the needle, the probable trajectory and location of the mass was visualized. A circumareolar skin incision was planned in such a way as to minimize the amount of dissection to reach the mass.  The skin incision was made. Flaps were raised and the location of the wire confirmed. The wire was delivered into the wound. A 2-0 silk figure-of-eight stay suture was placed around the wire and used for retraction. Dissection was then taken down circumferentially, taking care to include the  entire localizing needle and a wide margin of grossly normal tissue. The specimen and entire localizing wire were removed. The specimen was oriented and sent to radiology with the localization studies. Confirmation was received that the entire target lesion had been resected and closest margin being the skin-caudal margin. Re resection of the anterior and cauda margin was done. The wound was irrigated. Hemostasis was checked. Since the wound was so lateral, the sentinel lymph node biopsy was done through the same incision.  A hand-held gamma probe was used to identify the location of the hottest spot in the axilla. Dissection was carried down until subdermal facias was advanced. The probe was placed and again, the point of maximal count was found. Dissection continue until nodule was identified. The probe was placed in contact with the node and 688 counts were recorded. The node was excised in its entirety. Ex vivo, the node measured 1090 counts when placed on the probe. The bed of the node measured 10 counts. No additional hot spots were identified. No clinically abnormal nodes were palpated. The procedure was terminated. Hemostasis was achieved and the axillary fascia was closed with deep interrupted 3-0 Vicryl.  The wound was closed with interrupted sutures of 3-0 Vicryl and a subcuticular suture of Monocryl 3-0. No attempt was made to close the dead space. A dressing was applied.   The patient tolerated the procedure well and was taken to the postanesthesia care unit in stable condition.   Specimen:1.Right Breast mass (Orientation markers used: Cranial, Caudal, Medial, Lateral,Skin, Deep)                    2. Skin margin, suture  on cavity site                    3. Caudal margin, suture on cavity site                    4.Right axillary Sentinel Lymph nodes  Complications: None  Estimated Blood Loss: 62mL

## 2017-12-06 NOTE — Anesthesia Procedure Notes (Signed)
Procedure Name: LMA Insertion Date/Time: 12/06/2017 12:18 PM Performed by: Philbert Riser, CRNA Pre-anesthesia Checklist: Patient identified, Emergency Drugs available, Suction available, Patient being monitored and Timeout performed Patient Re-evaluated:Patient Re-evaluated prior to induction Oxygen Delivery Method: Circle system utilized and Simple face mask Preoxygenation: Pre-oxygenation with 100% oxygen Induction Type: IV induction LMA Size: 4.0

## 2017-12-07 ENCOUNTER — Encounter: Payer: Self-pay | Admitting: General Surgery

## 2017-12-07 NOTE — Progress Notes (Signed)
  Oncology Nurse Navigator Documentation  Navigator Location: [facility] (12/07/17 1300)   )Navigator Encounter Type: Telephone(post-op) (12/07/17 1300) Telephone: Lahoma Crocker Call;Appt Confirmation/Clarification;Symptom Mgt (12/07/17 1300) Abnormal Finding Date: 11/11/17 (12/07/17 1300) Confirmed Diagnosis Date: 11/25/17 (12/07/17 1300) Surgery Date: 12/06/17 (12/07/17 1300)             Patient Visit Type: Follow-up (12/07/17 1300)       Interventions: Coordination of Care (12/07/17 1300)   Coordination of Care: Appts (12/07/17 1300)                  Time Spent with Patient: 30 (12/07/17 1300)   Patient doing well post-op.  Contolling discomfort with Tylenol.  Confirmed follow-up appointments.

## 2017-12-08 NOTE — Anesthesia Postprocedure Evaluation (Signed)
Anesthesia Post Note  Patient: Brianna Rogers  Procedure(s) Performed: PARTIAL MASTECTOMY WITH NEEDLE LOCALIZATION (Right ) SENTINEL NODE BIOPSY (Right )  Patient location during evaluation: PACU Anesthesia Type: General Level of consciousness: awake and alert Pain management: pain level controlled Vital Signs Assessment: post-procedure vital signs reviewed and stable Respiratory status: spontaneous breathing, nonlabored ventilation and respiratory function stable Cardiovascular status: blood pressure returned to baseline and stable Postop Assessment: no apparent nausea or vomiting Anesthetic complications: no     Last Vitals:  Vitals:   12/06/17 1554 12/06/17 1636  BP: (!) 148/59 133/61  Pulse: 76 75  Resp: 16   Temp: (!) 36.2 C   SpO2: 98% 99%    Last Pain:  Vitals:   12/06/17 1636  TempSrc:   PainSc: 4                  Vennela Jutte Harvie Heck

## 2017-12-09 ENCOUNTER — Encounter: Payer: Self-pay | Admitting: Urgent Care

## 2017-12-10 LAB — SURGICAL PATHOLOGY

## 2017-12-14 ENCOUNTER — Ambulatory Visit
Admission: RE | Admit: 2017-12-14 | Discharge: 2017-12-14 | Disposition: A | Discharge status: Home / Self Care | Payer: Medicare Other | Source: Ambulatory Visit | Attending: Hematology and Oncology | Admitting: Hematology and Oncology

## 2017-12-14 DIAGNOSIS — Z13820 Encounter for screening for osteoporosis: Secondary | ICD-10-CM | POA: Insufficient documentation

## 2017-12-14 DIAGNOSIS — M85852 Other specified disorders of bone density and structure, left thigh: Secondary | ICD-10-CM | POA: Diagnosis not present

## 2017-12-20 ENCOUNTER — Encounter: Payer: Self-pay | Admitting: Hematology and Oncology

## 2017-12-20 ENCOUNTER — Inpatient Hospital Stay: Payer: Medicare Other | Attending: Hematology and Oncology | Admitting: Hematology and Oncology

## 2017-12-20 VITALS — BP 136/81 | HR 71 | Temp 97.6°F | Resp 18 | Wt 141.6 lb

## 2017-12-20 DIAGNOSIS — C50411 Malignant neoplasm of upper-outer quadrant of right female breast: Secondary | ICD-10-CM

## 2017-12-20 DIAGNOSIS — C50911 Malignant neoplasm of unspecified site of right female breast: Secondary | ICD-10-CM

## 2017-12-20 DIAGNOSIS — Z803 Family history of malignant neoplasm of breast: Secondary | ICD-10-CM

## 2017-12-20 DIAGNOSIS — Z170 Estrogen receptor positive status [ER+]: Secondary | ICD-10-CM | POA: Insufficient documentation

## 2017-12-20 DIAGNOSIS — M85852 Other specified disorders of bone density and structure, left thigh: Secondary | ICD-10-CM | POA: Insufficient documentation

## 2017-12-20 DIAGNOSIS — M8580 Other specified disorders of bone density and structure, unspecified site: Secondary | ICD-10-CM | POA: Diagnosis not present

## 2017-12-20 NOTE — Progress Notes (Signed)
[facility] day:  12/20/2017   Chief Complaint: Brianna Rogers is a 72 y.o. female with stage IA right breast cancer who is seen for assessment after interval surgery.  HPI:  The patient was last seen in the medical oncology clinic on 11/30/2017.  At that time, she felt well.  Exam revealed a 2 cm mass in the right breast with no palpable axillary adenopathy. She was scheduled for lumpectomy.  She underwent lumpectomy on 12/06/2017 by Dr. Windell Moment.  Pathology revealed a 1.7 cm grade III invasive mammary carcinoma of no special type.  There were biopsy site changes and a clip present.  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  Caudal margin re-excision revealed ductal and lobular carcinoma in situ.  In situ carcinoma was < 0.5 mm from the margin.  Thermal artifact limited interpretation.  New skin margin revealed in situ carcinoma < 0.5 mm from the surgical margin.  There was lymphovascular invasion.  One sentinel lymph node was negative.  Pathologic stage was pT1cN0.  Tumor was ER + (> 90%), PR + (> 90%) and Her2/neu -.  Bone density on 12/14/2017 revealed osteopenia with a T-score of -1.4 in the left femoral neck.  Symptomatically, patient is doing well post operatively. She describes an uncomplicated surgical course. Patient states, "they surgery was a piece of cake. The nuclear medicine shots were horrible". Patient states, "the pain (post surgery) was not bad. I took Tylenol and it took care of it". Patient denies pain at the present, she notes that her skin is "sore" and it feels like "little sizzles of carbonation coming out". Patient has her initial post-op visit on 12/21/2017 with Dr. Windell Moment.   Patient denies B symptoms and interval infections. She is eating well. Weight is down 1 pound. Patient denies pain in the clinic today.    Past Medical History:  Diagnosis Date  . Breast cancer (Brianna Rogers)   . Breast cancer, right (Brianna Rogers)  11/18/2017  . Diabetes mellitus without complication ([place])   . Heart murmur   . Hypertension     Past Surgical History:  Procedure Laterality Date  . ABDOMINAL HYSTERECTOMY    . APPENDECTOMY    . BREAST BIOPSY Right 11/18/2017   INVASIVE MAMMARY CARCINOMA.   Marland Kitchen BREAST LUMPECTOMY Right 12/06/2017   invasive mammary carcinoma  . COLONOSCOPY    . COLONOSCOPY WITH PROPOFOL N/A 11/15/2017   Procedure: COLONOSCOPY WITH PROPOFOL;  Surgeon: Lollie Sails, MD;  Location: [facility] ENDOSCOPY;  Service: Endoscopy;  Laterality: N/A;  . PARTIAL MASTECTOMY WITH NEEDLE LOCALIZATION Right 12/06/2017   Procedure: PARTIAL MASTECTOMY WITH NEEDLE LOCALIZATION;  Surgeon: Herbert Pun, MD;  Location: [facility] ORS;  Service: General;  Laterality: Right;  . SENTINEL NODE BIOPSY Right 12/06/2017   Procedure: SENTINEL NODE BIOPSY;  Surgeon: Herbert Pun, MD;  Location: [facility] ORS;  Service: General;  Laterality: Right;  . TUBAL LIGATION      Family History  Problem Relation Age of Onset  . Breast cancer Mother 4  . Cancer Mother   . Cancer Maternal Aunt   . Cancer Maternal Grandmother     Social History:  reports that she has never smoked. She has never used smokeless tobacco. She reports that she does not drink alcohol or use drugs.  She is an only child.  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  She travels every other day to her home in [place] to take care of her animals, but is planning to move back to [facility] soon. Patient is retired from [employer]. The patient is alone today.  Allergies:  Allergies  Allergen Reactions  . Bactrim [Sulfamethoxazole-Trimethoprim] Other (See Comments)    Unknown  . Macrodantin [Nitrofurantoin Macrocrystal] Other (See Comments)    Unknown  . Sulfa Antibiotics Other (See Comments)    Flu like symptoms    Current Medications: Current Outpatient Medications  Medication Sig Dispense Refill  .  acetaminophen (TYLENOL) 500 MG tablet Take 500 mg by mouth every 6 (six) hours as needed for moderate pain or headache.    Marland Kitchen aspirin EC 81 MG tablet Take 81 mg by mouth every evening.     . empagliflozin (JARDIANCE) 10 MG TABS tablet Take 10 mg by mouth daily.    Marland Kitchen glimepiride (AMARYL) 4 MG tablet Take 4 mg by mouth daily with breakfast.    . ibuprofen (ADVIL,MOTRIN) 200 MG tablet Take 200 mg by mouth every 6 (six) hours as needed for headache or moderate pain.    . metFORMIN (GLUCOPHAGE) 1000 MG tablet Take 1,000 mg by mouth 2 (two) times daily with a meal.    . Multiple Vitamin (MULTIVITAMIN) tablet Take 1 tablet by mouth daily.    . Omega-3 Fatty Acids (FISH OIL) 1200 MG CAPS Take 2,400 mg by mouth 2 (two) times daily.    . pravastatin (PRAVACHOL) 80 MG tablet Take 80 mg by mouth every evening.     . quinapril (ACCUPRIL) 20 MG tablet Take 20 mg by mouth daily.      No current facility-administered medications for this visit.     Review of Systems:  GENERAL:  Feels good.  No fevers or sweats. Weight down 1 pound.  PERFORMANCE STATUS (ECOG):  0 HEENT:  No visual changes, runny nose, sore throat, mouth sores or tenderness. Lungs: No shortness of breath or cough.  No hemoptysis. Cardiac:  No chest pain, palpitations, orthopnea, or PND. GI:  No nausea, vomiting, diarrhea, constipation, melena or hematochezia. GU:  No urgency, frequency, dysuria, or hematuria. Musculoskeletal:  Osteopenia.  No back pain.  No muscle tenderness. Extremities:  No pain or swelling. Skin: s/p lumpectomy to RIGHT breast.  Abdominal rash, plans to see dermatologist. Neuro:  No headache, numbness or weakness, balance or coordination issues. Endocrine:  No diabetes, thyroid issues, hot flashes or night sweats. Psych:  No mood changes, depression or anxiety. Pain:  No focal pain. Review of systems:  All other systems reviewed and found to be negative.  Physical Exam: Blood pressure 136/81, pulse 71, temperature  97.6 F (36.4 C), temperature source Tympanic, resp. rate 18, weight 141 lb 9 oz (64.2 kg). GENERAL:  Well developed, well nourished, woman sitting comfortably in the exam room in no acute distress. MENTAL STATUS:  Alert and oriented to person, place and time. HEAD:  Styled gray hair.  Normocephalic, atraumatic, face symmetric, no Cushingoid features. EYES:  Glasses.  Brown eyes.  Pupils equal round and reactive to light and accomodation.  No conjunctivitis or scleral icterus. ENT:  Oropharynx clear without lesion.  Tongue normal. Mucous membranes moist.  RESPIRATORY:  Clear to auscultation without rales, wheezes or rhonchi. CARDIOVASCULAR:  Regular rate and rhythm without murmur, rub or gallop. BREAST:  Right breast lumpectomy with a 3-4 cm linear incision with eschar and glue.  Small underlying hematoma.  Left breast with inferior fibrocystic changes.  Inverted nipple.  No mass, skin changes or nipple discharge.  ABDOMEN:  Soft, non-tender, with active bowel sounds, and no hepatosplenomegaly.  No masses. SKIN:  patchy dry skin right arm.  Tan with lacy band-like hypopigmentation across lower abdomen.  No rashes, ulcers or lesions. EXTREMITIES: No edema, no skin discoloration or tenderness.  No palpable cords. LYMPH NODES: No palpable cervical, supraclavicular, axillary or inguinal adenopathy  NEUROLOGICAL: Unremarkable. PSYCH:  Appropriate.   No visits with results within 3 Day(s) from this visit.  Latest known visit with results is:  Admission on 12/06/2017, Discharged on 12/06/2017  Component Date Value Ref Range Status  . Glucose-Capillary 12/06/2017 213* 65 - 99 mg/dL Final  . SURGICAL PATHOLOGY 12/06/2017    Final                   Value:Surgical Pathology CASE: [id number] PATIENT: Brianna Rogers Surgical Pathology Report     SPECIMEN SUBMITTED: A. Breast mass, right B. Breast mass #2, right; re-excision C. Breast mass, right; re-excision D. Sentinel lymph node 1  CLINICAL  HISTORY: None provided  PRE-OPERATIVE DIAGNOSIS: Malignant neoplasm right breast  POST-OPERATIVE DIAGNOSIS: Same as pre-op     DIAGNOSIS: A. RIGHT BREAST MASS; NEEDLE LOCALIZED LUMPECTOMY: - INVASIVE MAMMARY CARCINOMA OF NO SPECIAL TYPE. - BIOPSY SITE CHANGES, MARKER CLIP PRESENT. - DUCTAL AND LOBULAR CARCINOMA IN SITU.  B.  RIGHT BREAST MASS #2, CAUDAL MARGIN; REEXCISION: - DUCTAL AND LOBULAR CARCINOMA IN SITU. - IN SITU CARCINOMA IS SEEN <0.5 MM FROM THE SURGICAL MARGIN. - THERMAL ARTIFACT PARTIALLY LIMITS INTERPRETATION.  C.  RIGHT BREAST MASS, NEW SKIN MARGIN; REEXCISION: - IN SITU CARCINOMA SEEN <0.5 MM FROM THE SURGICAL MARGIN. - THERMAL ARTIFACT PARTIALLY LIMITS INTERPRETATION.  D.  SENTINEL LYMPH NODE #                         1; EXCISION: - NO TUMOR SEEN IN ONE LYMPH NODE (0/1). - SEE SUMMARY BELOW.  Surgical Pathology Cancer Case Summary  INVASIVE CARCINOMA OF THE BREAST Procedure:  Needle localized lumpectomy Specimen Laterality: Right Histologic Type: Invasive carcinoma of no special type Histologic Grade (Nottingham Histologic Score)           Glandular (Acinar)/Tubular Differentiation: 3           Nuclear Pleomorphism:  3           Mitotic Rate: 2           Overall Grade: 3 Tumor Size: 17 mm Ductal Carcinoma In Situ (DCIS): Present Margins:  Negative for invasive carcinoma; in situ carcinoma is <0.5 mm from skin and caudal margins; interpretation partially limited by cautery artifact Regional Lymph nodes:      Total # lymph nodes examined: 1      # Sentinel lymph nodes examined: 1      # Lymph nodes with macrometastasis (>2.0 mm): 0      # Lymph nodes with isolated tumor cells (<0.2 mm): 0      # Lymph nodes with micrometastasis (> 0.2 mm and < 2.0 mm): 0 Treatment Effect: No                          known pre-surgical therapy Lymphovascular Invasion: Present Pathologic Stage Classification (pTNM, AJCC 8th Edition): pT1c pN0 (sn) TNM  Descriptors: Not applicable  Biomarkers were performed on a separate specimen and in summary: Estrogen Receptor (ER) Status: POSITIVE, >90% nuclear staining Progesterone Receptor (PgR) Status: POSITIVE, >90% nuclear staining HER2 FISH:  NEGATIVE  Note: Immunostain E-cadherin is performed on selected slides of parts B and C and shows areas of both lobular and focal ductal carcinoma. The control slide stained appropriately.   GROSS DESCRIPTION:  A. Intraoperative Consultation:     Received: fresh     Specimen: right breast mass     Pathologic Evaluation: immediate gross evaluation of margins     Diagnosis: IOC: Tumor/biopsy site seen abutting anterior and caudal margin     Communicated to: Dr. Windell Moment at 1:45 PM on 12/06/2017, Brianna Rogers M.D.     Tissue submitted: not applicable  B. Intraoperative Consultation:     Receive                         d: fresh     Specimen: reexcision right breast mass #2     Pathologic Evaluation: immediate gross evaluation of margin     Diagnosis: IOC: Right breast mass; caudal margin reexcision: No tumor seen     Communicated to: Dr. Windell Moment at 2:14 PM on 12/06/2017, Brianna Rogers M.D.     Tissue submitted: not applicable  C. Intraoperative Consultation:     Received: fresh     Specimen: reexcision right breast mass     Pathologic Evaluation: immediate gross evaluation of margin     Diagnosis: IOC: Right breast mass, new skin margin; reexcision: No tumor seen grossly.       Defer to permanent for definitive diagnosis     Communicated to: Dr. Windell Moment at 2:19 PM on 12/06/2017, Brianna Rogers M.D.     Tissue submitted: not applicable  A. Labeled: right breast mass  Time in fixative:   1:47 PM on 12/06/2017  Cold Ischemic Time: 28 minutes  Total formalin fixation time 24.75 hours  Type of specimen: excision  Location of specimen: right  Size of specimen: 8.6 (medial-                         lateral) by 6.0 (superior-inferior)  by 3.1 (anterior-posterior) centimeter  Skin: not present  Direction of compression: anterior-posterior  Needle localization: yes, 1  Orientation of specimen: cranial, caudal, medial, lateral, deep and skin metallic markers Superior = blue Inferior = green Medial = yellow Lateral = orange Posterior = black Anterior/Superficial = red   Plane of sectioning: superior-inferior  Biopsy site: present metallic clip  Presence/absence of discrete mass: present  Number of discrete masses: 1  Size(s) of mass(es): 1.9 x 1.4 x 1.2 cm  Description of mass(es): stellate firm tan mass with central hemorrhage and metallic clip  Distance between masses/clips: not applicable  Distance of mass(es)/ biopsy site/clip to surgical margins: anterior-less than 0.1 cm, inferior-0.2 cm, superior-0.9 cm, lateral-0.5 cm, medial-4.6 cm and deep-1.4 cm  Description of remainder of tissue: yellow lobulated with fibrous tissue around mass  Oth                         er remarkable features: no additional noted  Tissue submitted for special investigation: not applicable  Block summary: 1-4-representative mass with closest perpendicular margins (lateral, anterior and inferior) (metallic clip in cassette 4 from radiograph) 5-perpendicular superior 6-perpendicular deep 7-perpendicular medial  B. Labeled: reexcision right breast mass #2 (Per [place] in [place] at 2:09 PM container marked #2 is caudal margin)  Time in fixative:   2:15 PM on 12/06/2017  Cold Ischemic Time: 20 minutes  Total formalin fixation time 24.25 hours  Type of specimen: excision  Location of specimen: right  Size of specimen: 3.0 x 2.1 x 0.6 cm  Skin: not present  Direction of compression: not applicable  Needle localization: no  Orientation of specimen: requisition states caudal margin: Suture on cavity site Surface with suture marked black and remaining external surface blue  Plane of sectioning: cannot be  determined  Biopsy site: not present                           Presence/absence of discrete mass: absent  Number of discrete masses: 0  Description of remainder of tissue: yellow lobulated fibrous and fatty  Other remarkable features: none noted  Tissue submitted for special investigation: not applicable  Block summary: 1-4-entirely submitted consecutively  C. Labeled: reexcision right breast mass  Time in fixative:   2:15 PM  Cold Ischemic Time: 22 minutes  Total formalin fixation time 24.25 hours  Type of specimen: excision  Location of specimen: right  Size of specimen: 2.4 x 3.8 x 1.1 cm  Skin: not present  Direction of compression: cannot be determined  Needle localization: no  Orientation of specimen: requisition skin margin suture on cavity site Surface with suture marked black and remaining blue  Plane of sectioning: cannot be determined  Biopsy site: not identified  Presence/absence of discrete mass: absent  Description of remainder of tissue: yellow lobulated fibrous and fatty  Other remarkable                          features: none noted  Tissue submitted for special investigation: not applicable  Block summary: 1-7-entirely submitted   D. Labeled: Sentinel lymph node #1  Tissue fragment(s): 1  Size: 3.5 x 2.0 x 0.8 cm  Description: received in formalin, predominantly fatty replaced irregularly shaped lymph node, sectioned  Entirely submitted in 1-3 cassette(s).    Final Diagnosis performed by Brianna Lek, MD.  Electronically signed 12/10/2017 11:51:43AM    The electronic signature indicates that the named Attending Pathologist has evaluated the specimen  Technical component performed at [facility], [address] [place], [place] [address] Lab: [phone] Dir: Rush Farmer, MD, MMM  Professional component performed at [facility], [facility], [place], [place], [place] [address] Lab: [phone]  Dir: Dellia Nims. Reuel Derby, MD    . Glucose-Capillary 12/06/2017 161* 65 - 99 mg/dL Final  . Comment 1 12/06/2017 Notify RN   Final  . Comment 2 12/06/2017 Document in Chart   Final    Assessment:  Kylan Veach is a 71 y.o. female with stage 1A right breast cancer s/p lumpectomy on 12/06/2017.  Pathology revealed a 1.7 cm grade III invasive mammary carcinoma of no special type.  There were biopsy site changes and a clip present.  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  Caudal margin re-excision revealed ductal and lobular carcinoma in situ.  In situ carcinoma was < 0.5 mm from the margin.  Thermal artifact limited interpretation.  New skin margin revealed in situ carcinoma < 0.5 mm from the surgical margin.  There was lymphovascular invasion.  One sentinel lymph node was negative.  Tumor was ER + (> 90%), PR + (> 90%) and Her2/neu 2+ (FISH negative).  Pathologic stage was pT1cN0.  Diagnostic right mammogram and ultrasound on 11/11/2017 revealed a suspicious mass in the 9 o'clock position of the right breast.  Targeted  ultrasound revealed a 1.5 x 2.2 x 1.9 cm mass with posterior acoustic shadowing 10 cm from the nipple.  The right axilla was negative for adenopathy.  Bone density on 12/09/2015 revealed osteopenia with a T-score of -1.3 in the left hip.  Bone density on 12/14/2017 revealed osteopenia with a T-score of -1.4 in the left femoral neck.  She has a family history of breast cancer x 4 (mother and 3 paternal aunts) and "female cancer" (maternal great grandmother).  [facility] genetic testing was negative for BRCA1/2.  Symptomatically, patient is doing well post-operatively.  Exam reveals post lumpectomy changes to her RIGHT breast. There is a 3-4 cm linear eschar noted. There is a underlying hematoma.     Plan: 1.  Review recent lumpectomy. Pathology revealed grade III invasive mammary carcinoma. Tumor was ER + (> 90%), PR + (>90%), and Her2/neu 2+ (FISH negative). Discuss sending  tissue off for OncoType DX to determine benefit of chemotherapy.  Discuss plan for hormonal therapy.  If no chemotherapy needed, discussed plan for radiation followed by hormonal therapy.  If chemotherapy needed, discuss plan for chemotherapy then radiation then hormonal therapy. 2.  Discuss Oncotype Dx testing. Confirmed with surgeon's office that testing was not sent. Will send today.  3.  Discuss referral to radiation oncology.  4.  Review bone density. T-score of -1.4 in the left femoral neck. Ten-year fracture probability by FRAX of 9.4% for hip fracture or 1.2% or greater for major osteoporotic fracture. Discussed calcium 1200 mg and vitamin D 800 IU daily. Previously discontinued at initial visit. Hold supplements until advised further by oncology. Discuss consideration of Prolia in the future. Patient will need dental clearance.  5. RTC after Oncotype testing to discuss treatment plans. Will need to call patient for an appointment.    Honor Loh, NP 12/20/2017, 3:45 PM  I saw and evaluated the patient, participating in the key portions of the service and reviewing pertinent diagnostic studies and records.  I reviewed the nurse practitioner's note and agree with the findings and the plan.  The assessment and plan were discussed with the patient.  Multiple questions were asked by the patient and answered.   Nolon Stalls, MD 12/20/2017,3:45 PM

## 2017-12-20 NOTE — Progress Notes (Signed)
Pt in for follow up post surgery 2 weeks ago for lumpectomy of right breast.  Pt states being "alittle sore".

## 2018-01-03 ENCOUNTER — Ambulatory Visit
Admission: RE | Admit: 2018-01-03 | Discharge: 2018-01-03 | Disposition: A | Discharge status: Home / Self Care | Payer: Medicare Other | Source: Ambulatory Visit | Attending: Radiation Oncology | Admitting: Radiation Oncology

## 2018-01-03 ENCOUNTER — Other Ambulatory Visit: Payer: Self-pay

## 2018-01-03 ENCOUNTER — Encounter: Payer: Self-pay | Admitting: Radiation Oncology

## 2018-01-03 VITALS — BP 162/80 | Temp 97.3°F | Resp 20 | Wt 142.4 lb

## 2018-01-03 DIAGNOSIS — Z9851 Tubal ligation status: Secondary | ICD-10-CM | POA: Diagnosis not present

## 2018-01-03 DIAGNOSIS — Z79899 Other long term (current) drug therapy: Secondary | ICD-10-CM | POA: Diagnosis not present

## 2018-01-03 DIAGNOSIS — Z170 Estrogen receptor positive status [ER+]: Secondary | ICD-10-CM | POA: Insufficient documentation

## 2018-01-03 DIAGNOSIS — Z9889 Other specified postprocedural states: Secondary | ICD-10-CM | POA: Insufficient documentation

## 2018-01-03 DIAGNOSIS — Z90710 Acquired absence of both cervix and uterus: Secondary | ICD-10-CM | POA: Diagnosis not present

## 2018-01-03 DIAGNOSIS — E119 Type 2 diabetes mellitus without complications: Secondary | ICD-10-CM | POA: Diagnosis not present

## 2018-01-03 DIAGNOSIS — Z7982 Long term (current) use of aspirin: Secondary | ICD-10-CM | POA: Insufficient documentation

## 2018-01-03 DIAGNOSIS — Z882 Allergy status to sulfonamides status: Secondary | ICD-10-CM | POA: Insufficient documentation

## 2018-01-03 DIAGNOSIS — Z9011 Acquired absence of right breast and nipple: Secondary | ICD-10-CM | POA: Diagnosis not present

## 2018-01-03 DIAGNOSIS — I10 Essential (primary) hypertension: Secondary | ICD-10-CM | POA: Insufficient documentation

## 2018-01-03 DIAGNOSIS — Z7984 Long term (current) use of oral hypoglycemic drugs: Secondary | ICD-10-CM | POA: Diagnosis not present

## 2018-01-03 DIAGNOSIS — C50411 Malignant neoplasm of upper-outer quadrant of right female breast: Secondary | ICD-10-CM | POA: Insufficient documentation

## 2018-01-03 NOTE — Consult Note (Signed)
NEW PATIENT EVALUATION  Name: Brianna Rogers  MRN: [id number]  Date:   01/03/2018     DOB: 05-03-1946   This 72 y.o. female patient presents to the clinic for initial evaluation of tage IA (T1 CN 0 M0) invasive mammary carcinoma of the right breast status post wide local excision and sentinel node biopsy ER/PR positive HER-2/neu not overexpressed.  REFERRING PHYSICIAN: Gauger, Victoriano Lain, *  CHIEF COMPLAINT:  Chief Complaint  Patient presents with  . Breast Cancer    Pt is here for initial consultation of breast cancer.      DIAGNOSIS: The encounter diagnosis was Malignant neoplasm of upper-outer quadrant of right breast in female, estrogen receptor positive ([place]).   PREVIOUS INVESTIGATIONS:  Pathology reports reviewed Mammogram and ultrasound reviewed Clinical notes reviewed  HPI: patient is a 72 year old female who was found on screening mammography to have a palpable indiscrete firm mass in the cup position of the right breast 10 cm from the nipple concerning for malignancy. Ultrasound confirmed a 1.5 x 2.2 cm mass with acoustic shadowing suspicious for malignancy. She went on to have an ultrasound-guided biopsy which was positive for invasive mammary carcinoma ER PR positive HER-2/neu not overexpressed.she went on to have a wide local excision and sentinel node biopsy.tumor was 1.7 cm. Negative for invasive cancer and margins although less than 0.5 mm from skin and caudal margins. There was additional tissue submitted at the time of surgery.one sentinel lymph node was negative for metastatic disease. Patient tolerated her surgery well and is healing. They have submitted on Oncotype DX although results are pending. She is now referred to radiation oncology for consideration of treatment.she specifically denies breast tenderness cough or bone pain.   PLANNED TREATMENT REGIMEN: right whole breast radiotherapy  PAST MEDICAL HISTORY:  has a past medical history of Breast cancer  ([place]), Breast cancer, right ([place]) (11/18/2017), Diabetes mellitus without complication ([place]), Heart murmur, and Hypertension.    PAST SURGICAL HISTORY:  Past Surgical History:  Procedure Laterality Date  . ABDOMINAL HYSTERECTOMY    . APPENDECTOMY    . BREAST BIOPSY Right 11/18/2017   INVASIVE MAMMARY CARCINOMA.   Marland Kitchen BREAST LUMPECTOMY Right 12/06/2017   invasive mammary carcinoma  . COLONOSCOPY    . COLONOSCOPY WITH PROPOFOL N/A 11/15/2017   Procedure: COLONOSCOPY WITH PROPOFOL;  Surgeon: Lollie Sails, MD;  Location: [facility] ENDOSCOPY;  Service: Endoscopy;  Laterality: N/A;  . PARTIAL MASTECTOMY WITH NEEDLE LOCALIZATION Right 12/06/2017   Procedure: PARTIAL MASTECTOMY WITH NEEDLE LOCALIZATION;  Surgeon: Herbert Pun, MD;  Location: [facility] ORS;  Service: General;  Laterality: Right;  . SENTINEL NODE BIOPSY Right 12/06/2017   Procedure: SENTINEL NODE BIOPSY;  Surgeon: Herbert Pun, MD;  Location: [facility] ORS;  Service: General;  Laterality: Right;  . TUBAL LIGATION      FAMILY HISTORY: family history includes Breast cancer (age of onset: 76) in her mother; Cancer in her maternal aunt, maternal grandmother, and mother.  SOCIAL HISTORY:  reports that she has never smoked. She has never used smokeless tobacco. She reports that she does not drink alcohol or use drugs.  ALLERGIES: Bactrim [sulfamethoxazole-trimethoprim]; Macrodantin [nitrofurantoin macrocrystal]; and Sulfa antibiotics  MEDICATIONS:  Current Outpatient Medications  Medication Sig Dispense Refill  . acetaminophen (TYLENOL) 500 MG tablet Take 500 mg by mouth every 6 (six) hours as needed for moderate pain or headache.    Marland Kitchen aspirin EC 81 MG tablet Take 81 mg by mouth every evening.     . empagliflozin (JARDIANCE) 10  MG TABS tablet Take 10 mg by mouth daily.    Marland Kitchen glimepiride (AMARYL) 4 MG tablet Take 4 mg by mouth daily with breakfast.    . ibuprofen (ADVIL,MOTRIN) 200 MG tablet Take 200 mg by mouth every 6 (six)  hours as needed for headache or moderate pain.    . metFORMIN (GLUCOPHAGE) 1000 MG tablet Take 1,000 mg by mouth 2 (two) times daily with a meal.    . Multiple Vitamin (MULTIVITAMIN) tablet Take 1 tablet by mouth daily.    . Omega-3 Fatty Acids (FISH OIL) 1200 MG CAPS Take 2,400 mg by mouth 2 (two) times daily.    . pravastatin (PRAVACHOL) 80 MG tablet Take 80 mg by mouth every evening.     . quinapril (ACCUPRIL) 20 MG tablet Take 20 mg by mouth daily.      No current facility-administered medications for this encounter.     ECOG PERFORMANCE STATUS:  0 - Asymptomatic  REVIEW OF SYSTEMS:  Patient denies any weight loss, fatigue, weakness, fever, chills or night sweats. Patient denies any loss of vision, blurred vision. Patient denies any ringing  of the ears or hearing loss. No irregular heartbeat. Patient denies heart murmur or history of fainting. Patient denies any chest pain or pain radiating to her upper extremities. Patient denies any shortness of breath, difficulty breathing at night, cough or hemoptysis. Patient denies any swelling in the lower legs. Patient denies any nausea vomiting, vomiting of blood, or coffee ground material in the vomitus. Patient denies any stomach pain. Patient states has had normal bowel movements no significant constipation or diarrhea. Patient denies any dysuria, hematuria or significant nocturia. Patient denies any problems walking, swelling in the joints or loss of balance. Patient denies any skin changes, loss of hair or loss of weight. Patient denies any excessive worrying or anxiety or significant depression. Patient denies any problems with insomnia. Patient denies excessive thirst, polyuria, polydipsia. Patient denies any swollen glands, patient denies easy bruising or easy bleeding. Patient denies any recent infections, allergies or URI. Patient "s visual fields have not changed significantly in recent time.    PHYSICAL EXAM: BP (!) 162/80   Temp (!) 97.3  F (36.3 C)   Resp 20   Wt 142 lb 6.7 oz (64.6 kg)   BMI 28.28 kg/m  She is status post wide local excision of the right breast incision is healing well with some slight scabbing of this incision. No dominant mass or nodularity is noted in either breast in 2 positions examined. Patient's breasts are large and dense. No axillary or supraclavicular adenopathy is identified. Well-developed well-nourished patient in NAD. HEENT reveals PERLA, EOMI, discs not visualized.  Oral cavity is clear. No oral mucosal lesions are identified. Neck is clear without evidence of cervical or supraclavicular adenopathy. Lungs are clear to A&P. Cardiac examination is essentially unremarkable with regular rate and rhythm without murmur rub or thrill. Abdomen is benign with no organomegaly or masses noted. Motor sensory and DTR levels are equal and symmetric in the upper and lower extremities. Cranial nerves II through XII are grossly intact. Proprioception is intact. No peripheral adenopathy or edema is identified. No motor or sensory levels are noted. Crude visual fields are within normal range.  LABORATORY DATA: pathology reports reviewed    RADIOLOGY RESULTS:mammogram and ultrasound reviewed   IMPRESSION: stage IA invasive mammary carcinoma the right breast as was wide local excision and sentinel node biopsy in 72 year old female with Oncotype DX pending.  PLAN: this time  I would recommend whole breast radiation. I believe the breasts are too large for hypofractionated course of treatment although we will evaluate that. I will plan on delivering 5040 cGy in 28 fractions. Also would boost her scar another 1600 cGy based on the close margins. Risks and benefits of treatment including skin reaction fatigue alteration of blood counts possible inclusion of superficial lung all were described in detail. I have personally set up and ordered CT simulation for next week. Will review her Oncotype DX prior to simulation. If she  needs chemotherapy we'll sequence our treatments after chemotherapy. Patient copy has my treatment plan well.  I would like to take this opportunity to thank you for allowing me to participate in the care of your patient.Noreene Filbert, MD

## 2018-01-05 ENCOUNTER — Encounter: Payer: Self-pay | Admitting: Urgent Care

## 2018-01-12 ENCOUNTER — Ambulatory Visit: Payer: Medicare Other

## 2018-01-19 ENCOUNTER — Ambulatory Visit
Admission: RE | Admit: 2018-01-19 | Discharge: 2018-01-19 | Disposition: A | Discharge status: Home / Self Care | Payer: Medicare Other | Source: Ambulatory Visit | Attending: Radiation Oncology | Admitting: Radiation Oncology

## 2018-01-19 ENCOUNTER — Encounter: Payer: Self-pay | Admitting: Pathology

## 2018-01-19 ENCOUNTER — Ambulatory Visit: Payer: Medicare Other

## 2018-01-19 ENCOUNTER — Encounter: Payer: Self-pay | Admitting: Hematology and Oncology

## 2018-01-19 DIAGNOSIS — C50411 Malignant neoplasm of upper-outer quadrant of right female breast: Secondary | ICD-10-CM | POA: Diagnosis not present

## 2018-01-19 DIAGNOSIS — Z510 Encounter for antineoplastic radiation therapy: Secondary | ICD-10-CM | POA: Insufficient documentation

## 2018-01-19 DIAGNOSIS — Z170 Estrogen receptor positive status [ER+]: Secondary | ICD-10-CM | POA: Insufficient documentation

## 2018-01-20 ENCOUNTER — Ambulatory Visit: Payer: Medicare Other

## 2018-01-21 ENCOUNTER — Ambulatory Visit: Payer: Medicare Other

## 2018-01-21 ENCOUNTER — Other Ambulatory Visit: Payer: Self-pay | Admitting: *Deleted

## 2018-01-21 DIAGNOSIS — C50411 Malignant neoplasm of upper-outer quadrant of right female breast: Secondary | ICD-10-CM

## 2018-01-21 DIAGNOSIS — Z170 Estrogen receptor positive status [ER+]: Principal | ICD-10-CM

## 2018-01-24 ENCOUNTER — Ambulatory Visit: Payer: Medicare Other

## 2018-01-24 DIAGNOSIS — Z510 Encounter for antineoplastic radiation therapy: Secondary | ICD-10-CM | POA: Diagnosis not present

## 2018-01-25 ENCOUNTER — Ambulatory Visit: Payer: Medicare Other

## 2018-01-26 ENCOUNTER — Ambulatory Visit
Admission: RE | Admit: 2018-01-26 | Discharge: 2018-01-26 | Disposition: A | Discharge status: Home / Self Care | Payer: Medicare Other | Source: Ambulatory Visit | Attending: Radiation Oncology | Admitting: Radiation Oncology

## 2018-01-26 ENCOUNTER — Ambulatory Visit: Payer: Medicare Other

## 2018-01-26 DIAGNOSIS — Z510 Encounter for antineoplastic radiation therapy: Secondary | ICD-10-CM | POA: Diagnosis not present

## 2018-01-27 ENCOUNTER — Ambulatory Visit
Admission: RE | Admit: 2018-01-27 | Discharge: 2018-01-27 | Disposition: A | Discharge status: Home / Self Care | Payer: Medicare Other | Source: Ambulatory Visit | Attending: Radiation Oncology | Admitting: Radiation Oncology

## 2018-01-27 ENCOUNTER — Ambulatory Visit: Payer: Medicare Other

## 2018-01-27 DIAGNOSIS — Z510 Encounter for antineoplastic radiation therapy: Secondary | ICD-10-CM | POA: Diagnosis not present

## 2018-01-28 ENCOUNTER — Ambulatory Visit: Payer: Medicare Other

## 2018-01-28 ENCOUNTER — Ambulatory Visit
Admission: RE | Admit: 2018-01-28 | Discharge: 2018-01-28 | Disposition: A | Discharge status: Home / Self Care | Payer: Medicare Other | Source: Ambulatory Visit | Attending: Radiation Oncology | Admitting: Radiation Oncology

## 2018-01-28 DIAGNOSIS — Z510 Encounter for antineoplastic radiation therapy: Secondary | ICD-10-CM | POA: Diagnosis not present

## 2018-01-31 ENCOUNTER — Ambulatory Visit
Admission: RE | Admit: 2018-01-31 | Discharge: 2018-01-31 | Disposition: A | Discharge status: Home / Self Care | Payer: Medicare Other | Source: Ambulatory Visit | Attending: Radiation Oncology | Admitting: Radiation Oncology

## 2018-01-31 DIAGNOSIS — Z510 Encounter for antineoplastic radiation therapy: Secondary | ICD-10-CM | POA: Diagnosis not present

## 2018-02-01 ENCOUNTER — Ambulatory Visit
Admission: RE | Admit: 2018-02-01 | Discharge: 2018-02-01 | Disposition: A | Discharge status: Home / Self Care | Payer: Medicare Other | Source: Ambulatory Visit | Attending: Radiation Oncology | Admitting: Radiation Oncology

## 2018-02-01 DIAGNOSIS — Z510 Encounter for antineoplastic radiation therapy: Secondary | ICD-10-CM | POA: Diagnosis not present

## 2018-02-02 ENCOUNTER — Ambulatory Visit
Admission: RE | Admit: 2018-02-02 | Discharge: 2018-02-02 | Disposition: A | Discharge status: Home / Self Care | Payer: Medicare Other | Source: Ambulatory Visit | Attending: Radiation Oncology | Admitting: Radiation Oncology

## 2018-02-02 DIAGNOSIS — Z510 Encounter for antineoplastic radiation therapy: Secondary | ICD-10-CM | POA: Diagnosis not present

## 2018-02-03 ENCOUNTER — Ambulatory Visit
Admission: RE | Admit: 2018-02-03 | Discharge: 2018-02-03 | Disposition: A | Discharge status: Home / Self Care | Payer: Medicare Other | Source: Ambulatory Visit | Attending: Radiation Oncology | Admitting: Radiation Oncology

## 2018-02-03 DIAGNOSIS — Z510 Encounter for antineoplastic radiation therapy: Secondary | ICD-10-CM | POA: Diagnosis not present

## 2018-02-04 ENCOUNTER — Ambulatory Visit: Payer: Medicare Other

## 2018-02-07 ENCOUNTER — Ambulatory Visit
Admission: RE | Admit: 2018-02-07 | Discharge: 2018-02-07 | Disposition: A | Discharge status: Home / Self Care | Payer: Medicare Other | Source: Ambulatory Visit | Attending: Radiation Oncology | Admitting: Radiation Oncology

## 2018-02-07 ENCOUNTER — Ambulatory Visit: Payer: Medicare Other

## 2018-02-07 DIAGNOSIS — Z510 Encounter for antineoplastic radiation therapy: Secondary | ICD-10-CM | POA: Diagnosis not present

## 2018-02-08 ENCOUNTER — Ambulatory Visit
Admission: RE | Admit: 2018-02-08 | Discharge: 2018-02-08 | Disposition: A | Discharge status: Home / Self Care | Payer: Medicare Other | Source: Ambulatory Visit | Attending: Radiation Oncology | Admitting: Radiation Oncology

## 2018-02-08 ENCOUNTER — Ambulatory Visit: Payer: Medicare Other

## 2018-02-08 DIAGNOSIS — Z510 Encounter for antineoplastic radiation therapy: Secondary | ICD-10-CM | POA: Diagnosis not present

## 2018-02-09 ENCOUNTER — Ambulatory Visit: Payer: Medicare Other

## 2018-02-09 ENCOUNTER — Ambulatory Visit
Admission: RE | Admit: 2018-02-09 | Discharge: 2018-02-09 | Disposition: A | Discharge status: Home / Self Care | Payer: Medicare Other | Source: Ambulatory Visit | Attending: Radiation Oncology | Admitting: Radiation Oncology

## 2018-02-09 ENCOUNTER — Inpatient Hospital Stay: Payer: Medicare Other | Attending: Radiation Oncology

## 2018-02-09 DIAGNOSIS — Z170 Estrogen receptor positive status [ER+]: Secondary | ICD-10-CM

## 2018-02-09 DIAGNOSIS — C50411 Malignant neoplasm of upper-outer quadrant of right female breast: Secondary | ICD-10-CM | POA: Diagnosis not present

## 2018-02-09 DIAGNOSIS — Z510 Encounter for antineoplastic radiation therapy: Secondary | ICD-10-CM | POA: Diagnosis not present

## 2018-02-09 LAB — CBC
HCT: 41.1 % (ref 35.0–47.0)
HEMOGLOBIN: 14.4 g/dL (ref 12.0–16.0)
MCH: 31.5 pg (ref 26.0–34.0)
MCHC: 35.1 g/dL (ref 32.0–36.0)
MCV: 89.7 fL (ref 80.0–100.0)
Platelets: 187 10*3/uL (ref 150–440)
RBC: 4.58 MIL/uL (ref 3.80–5.20)
RDW: 13.5 % (ref 11.5–14.5)
WBC: 6.3 10*3/uL (ref 3.6–11.0)

## 2018-02-10 ENCOUNTER — Ambulatory Visit
Admission: RE | Admit: 2018-02-10 | Discharge: 2018-02-10 | Disposition: A | Discharge status: Home / Self Care | Payer: Medicare Other | Source: Ambulatory Visit | Attending: Radiation Oncology | Admitting: Radiation Oncology

## 2018-02-10 ENCOUNTER — Ambulatory Visit: Payer: Medicare Other

## 2018-02-10 DIAGNOSIS — Z510 Encounter for antineoplastic radiation therapy: Secondary | ICD-10-CM | POA: Diagnosis not present

## 2018-02-11 ENCOUNTER — Ambulatory Visit
Admission: RE | Admit: 2018-02-11 | Discharge: 2018-02-11 | Disposition: A | Discharge status: Home / Self Care | Payer: Medicare Other | Source: Ambulatory Visit | Attending: Radiation Oncology | Admitting: Radiation Oncology

## 2018-02-11 ENCOUNTER — Ambulatory Visit: Payer: Medicare Other

## 2018-02-11 DIAGNOSIS — Z510 Encounter for antineoplastic radiation therapy: Secondary | ICD-10-CM | POA: Diagnosis not present

## 2018-02-15 ENCOUNTER — Ambulatory Visit
Admission: RE | Admit: 2018-02-15 | Discharge: 2018-02-15 | Disposition: A | Discharge status: Home / Self Care | Payer: Medicare Other | Source: Ambulatory Visit | Attending: Radiation Oncology | Admitting: Radiation Oncology

## 2018-02-15 DIAGNOSIS — Z510 Encounter for antineoplastic radiation therapy: Secondary | ICD-10-CM | POA: Diagnosis not present

## 2018-02-16 ENCOUNTER — Ambulatory Visit
Admission: RE | Admit: 2018-02-16 | Discharge: 2018-02-16 | Disposition: A | Discharge status: Home / Self Care | Payer: Medicare Other | Source: Ambulatory Visit | Attending: Radiation Oncology | Admitting: Radiation Oncology

## 2018-02-16 DIAGNOSIS — Z510 Encounter for antineoplastic radiation therapy: Secondary | ICD-10-CM | POA: Diagnosis not present

## 2018-02-17 ENCOUNTER — Ambulatory Visit
Admission: RE | Admit: 2018-02-17 | Discharge: 2018-02-17 | Disposition: A | Discharge status: Home / Self Care | Payer: Medicare Other | Source: Ambulatory Visit | Attending: Radiation Oncology | Admitting: Radiation Oncology

## 2018-02-17 DIAGNOSIS — Z510 Encounter for antineoplastic radiation therapy: Secondary | ICD-10-CM | POA: Diagnosis not present

## 2018-02-18 ENCOUNTER — Ambulatory Visit
Admission: RE | Admit: 2018-02-18 | Discharge: 2018-02-18 | Disposition: A | Discharge status: Home / Self Care | Payer: Medicare Other | Source: Ambulatory Visit | Attending: Radiation Oncology | Admitting: Radiation Oncology

## 2018-02-18 DIAGNOSIS — Z510 Encounter for antineoplastic radiation therapy: Secondary | ICD-10-CM | POA: Diagnosis not present

## 2018-02-21 ENCOUNTER — Ambulatory Visit: Payer: Medicare Other

## 2018-02-21 ENCOUNTER — Ambulatory Visit
Admission: RE | Admit: 2018-02-21 | Discharge: 2018-02-21 | Disposition: A | Discharge status: Home / Self Care | Payer: Medicare Other | Source: Ambulatory Visit | Attending: Radiation Oncology | Admitting: Radiation Oncology

## 2018-02-21 DIAGNOSIS — C50811 Malignant neoplasm of overlapping sites of right female breast: Secondary | ICD-10-CM | POA: Diagnosis not present

## 2018-02-21 DIAGNOSIS — Z9889 Other specified postprocedural states: Secondary | ICD-10-CM | POA: Insufficient documentation

## 2018-02-21 DIAGNOSIS — Z510 Encounter for antineoplastic radiation therapy: Secondary | ICD-10-CM | POA: Insufficient documentation

## 2018-02-21 DIAGNOSIS — Z170 Estrogen receptor positive status [ER+]: Secondary | ICD-10-CM | POA: Insufficient documentation

## 2018-02-21 DIAGNOSIS — Z803 Family history of malignant neoplasm of breast: Secondary | ICD-10-CM | POA: Insufficient documentation

## 2018-02-22 ENCOUNTER — Ambulatory Visit: Payer: Medicare Other

## 2018-02-22 ENCOUNTER — Ambulatory Visit
Admission: RE | Admit: 2018-02-22 | Discharge: 2018-02-22 | Disposition: A | Discharge status: Home / Self Care | Payer: Medicare Other | Source: Ambulatory Visit | Attending: Radiation Oncology | Admitting: Radiation Oncology

## 2018-02-22 DIAGNOSIS — Z510 Encounter for antineoplastic radiation therapy: Secondary | ICD-10-CM | POA: Diagnosis not present

## 2018-02-23 ENCOUNTER — Ambulatory Visit
Admission: RE | Admit: 2018-02-23 | Discharge: 2018-02-23 | Disposition: A | Discharge status: Home / Self Care | Payer: Medicare Other | Source: Ambulatory Visit | Attending: Radiation Oncology | Admitting: Radiation Oncology

## 2018-02-23 ENCOUNTER — Other Ambulatory Visit: Payer: Self-pay

## 2018-02-23 ENCOUNTER — Inpatient Hospital Stay: Payer: Medicare Other | Attending: Radiation Oncology

## 2018-02-23 ENCOUNTER — Ambulatory Visit: Payer: Medicare Other

## 2018-02-23 DIAGNOSIS — Z170 Estrogen receptor positive status [ER+]: Principal | ICD-10-CM

## 2018-02-23 DIAGNOSIS — C50811 Malignant neoplasm of overlapping sites of right female breast: Secondary | ICD-10-CM | POA: Insufficient documentation

## 2018-02-23 DIAGNOSIS — M8580 Other specified disorders of bone density and structure, unspecified site: Secondary | ICD-10-CM | POA: Insufficient documentation

## 2018-02-23 DIAGNOSIS — Z510 Encounter for antineoplastic radiation therapy: Secondary | ICD-10-CM | POA: Diagnosis not present

## 2018-02-23 DIAGNOSIS — C50411 Malignant neoplasm of upper-outer quadrant of right female breast: Secondary | ICD-10-CM

## 2018-02-23 LAB — CBC
HCT: 43.2 % (ref 35.0–47.0)
HEMOGLOBIN: 14.7 g/dL (ref 12.0–16.0)
MCH: 31.1 pg (ref 26.0–34.0)
MCHC: 34.1 g/dL (ref 32.0–36.0)
MCV: 91.1 fL (ref 80.0–100.0)
PLATELETS: 180 10*3/uL (ref 150–440)
RBC: 4.74 MIL/uL (ref 3.80–5.20)
RDW: 13.6 % (ref 11.5–14.5)
WBC: 6.9 10*3/uL (ref 3.6–11.0)

## 2018-02-24 ENCOUNTER — Ambulatory Visit: Payer: Medicare Other

## 2018-02-24 ENCOUNTER — Ambulatory Visit
Admission: RE | Admit: 2018-02-24 | Discharge: 2018-02-24 | Disposition: A | Discharge status: Home / Self Care | Payer: Medicare Other | Source: Ambulatory Visit | Attending: Radiation Oncology | Admitting: Radiation Oncology

## 2018-02-24 DIAGNOSIS — Z510 Encounter for antineoplastic radiation therapy: Secondary | ICD-10-CM | POA: Diagnosis not present

## 2018-02-25 ENCOUNTER — Ambulatory Visit
Admission: RE | Admit: 2018-02-25 | Discharge: 2018-02-25 | Disposition: A | Discharge status: Home / Self Care | Payer: Medicare Other | Source: Ambulatory Visit | Attending: Radiation Oncology | Admitting: Radiation Oncology

## 2018-02-25 ENCOUNTER — Ambulatory Visit: Payer: Medicare Other

## 2018-02-25 DIAGNOSIS — Z510 Encounter for antineoplastic radiation therapy: Secondary | ICD-10-CM | POA: Diagnosis not present

## 2018-02-26 ENCOUNTER — Ambulatory Visit: Payer: Medicare Other

## 2018-02-28 ENCOUNTER — Ambulatory Visit
Admission: RE | Admit: 2018-02-28 | Discharge: 2018-02-28 | Disposition: A | Discharge status: Home / Self Care | Payer: Medicare Other | Source: Ambulatory Visit | Attending: Radiation Oncology | Admitting: Radiation Oncology

## 2018-02-28 DIAGNOSIS — Z510 Encounter for antineoplastic radiation therapy: Secondary | ICD-10-CM | POA: Diagnosis not present

## 2018-03-01 ENCOUNTER — Ambulatory Visit
Admission: RE | Admit: 2018-03-01 | Discharge: 2018-03-01 | Disposition: A | Discharge status: Home / Self Care | Payer: Medicare Other | Source: Ambulatory Visit | Attending: Radiation Oncology | Admitting: Radiation Oncology

## 2018-03-01 DIAGNOSIS — Z510 Encounter for antineoplastic radiation therapy: Secondary | ICD-10-CM | POA: Diagnosis not present

## 2018-03-02 ENCOUNTER — Ambulatory Visit
Admission: RE | Admit: 2018-03-02 | Discharge: 2018-03-02 | Disposition: A | Discharge status: Home / Self Care | Payer: Medicare Other | Source: Ambulatory Visit | Attending: Radiation Oncology | Admitting: Radiation Oncology

## 2018-03-02 ENCOUNTER — Ambulatory Visit: Payer: Medicare Other

## 2018-03-02 DIAGNOSIS — Z510 Encounter for antineoplastic radiation therapy: Secondary | ICD-10-CM | POA: Diagnosis not present

## 2018-03-03 ENCOUNTER — Ambulatory Visit
Admission: RE | Admit: 2018-03-03 | Discharge: 2018-03-03 | Disposition: A | Discharge status: Home / Self Care | Payer: Medicare Other | Source: Ambulatory Visit | Attending: Radiation Oncology | Admitting: Radiation Oncology

## 2018-03-03 ENCOUNTER — Ambulatory Visit: Payer: Medicare Other

## 2018-03-03 DIAGNOSIS — Z510 Encounter for antineoplastic radiation therapy: Secondary | ICD-10-CM | POA: Diagnosis not present

## 2018-03-04 ENCOUNTER — Ambulatory Visit: Payer: Medicare Other

## 2018-03-07 ENCOUNTER — Ambulatory Visit: Payer: Medicare Other

## 2018-03-08 ENCOUNTER — Ambulatory Visit: Payer: Medicare Other

## 2018-03-09 ENCOUNTER — Ambulatory Visit: Payer: Medicare Other

## 2018-03-10 ENCOUNTER — Ambulatory Visit: Payer: Medicare Other

## 2018-03-11 ENCOUNTER — Ambulatory Visit: Payer: Medicare Other

## 2018-03-14 ENCOUNTER — Ambulatory Visit: Payer: Medicare Other

## 2018-03-15 ENCOUNTER — Other Ambulatory Visit: Payer: Self-pay | Admitting: Urgent Care

## 2018-03-15 ENCOUNTER — Inpatient Hospital Stay: Payer: Medicare Other

## 2018-03-15 ENCOUNTER — Inpatient Hospital Stay: Payer: Medicare Other | Admitting: Hematology and Oncology

## 2018-03-15 ENCOUNTER — Other Ambulatory Visit: Payer: Self-pay

## 2018-03-15 ENCOUNTER — Encounter: Payer: Self-pay | Admitting: Hematology and Oncology

## 2018-03-15 ENCOUNTER — Ambulatory Visit: Payer: Medicare Other

## 2018-03-15 VITALS — BP 147/81 | HR 75 | Temp 97.7°F | Wt 144.8 lb

## 2018-03-15 DIAGNOSIS — C50811 Malignant neoplasm of overlapping sites of right female breast: Secondary | ICD-10-CM | POA: Diagnosis not present

## 2018-03-15 DIAGNOSIS — Z170 Estrogen receptor positive status [ER+]: Secondary | ICD-10-CM

## 2018-03-15 DIAGNOSIS — M8580 Other specified disorders of bone density and structure, unspecified site: Secondary | ICD-10-CM | POA: Diagnosis not present

## 2018-03-15 DIAGNOSIS — C50411 Malignant neoplasm of upper-outer quadrant of right female breast: Secondary | ICD-10-CM

## 2018-03-15 LAB — COMPREHENSIVE METABOLIC PANEL
ALT: 22 U/L (ref 0–44)
ANION GAP: 10 (ref 5–15)
AST: 19 U/L (ref 15–41)
Albumin: 4.1 g/dL (ref 3.5–5.0)
Alkaline Phosphatase: 63 U/L (ref 38–126)
BILIRUBIN TOTAL: 0.7 mg/dL (ref 0.3–1.2)
BUN: 22 mg/dL (ref 8–23)
CO2: 24 mmol/L (ref 22–32)
Calcium: 10 mg/dL (ref 8.9–10.3)
Chloride: 103 mmol/L (ref 98–111)
Creatinine, Ser: 0.77 mg/dL (ref 0.44–1.00)
GFR calc Af Amer: >60 mL/min (ref >60)
Glucose, Bld: 224 mg/dL — ABNORMAL HIGH (ref 70–99)
POTASSIUM: 4.6 mmol/L (ref 3.5–5.1)
Sodium: 137 mmol/L (ref 135–145)
Total Protein: 7.6 g/dL (ref 6.5–8.1)

## 2018-03-15 LAB — CBC WITH DIFFERENTIAL/PLATELET
Basophils Absolute: 0.1 10*3/uL (ref 0–0.1)
Basophils Relative: 1 %
Eosinophils Absolute: 0.3 10*3/uL (ref 0–0.7)
Eosinophils Relative: 4 %
HEMATOCRIT: 42.6 % (ref 35.0–47.0)
HEMOGLOBIN: 14.7 g/dL (ref 12.0–16.0)
LYMPHS PCT: 19 %
Lymphs Abs: 1.4 10*3/uL (ref 1.0–3.6)
MCH: 31.3 pg (ref 26.0–34.0)
MCHC: 34.4 g/dL (ref 32.0–36.0)
MCV: 90.9 fL (ref 80.0–100.0)
Monocytes Absolute: 0.6 10*3/uL (ref 0.2–0.9)
Monocytes Relative: 8 %
NEUTROS ABS: 5 10*3/uL (ref 1.4–6.5)
NEUTROS PCT: 68 %
PLATELETS: 199 10*3/uL (ref 150–440)
RBC: 4.68 MIL/uL (ref 3.80–5.20)
RDW: 13.6 % (ref 11.5–14.5)
WBC: 7.4 10*3/uL (ref 3.6–11.0)

## 2018-03-15 MED ORDER — LETROZOLE 2.5 MG PO TABS: 2.5000 mg | ORAL_TABLET | Freq: Every day | ORAL | 0 refills | Status: DC

## 2018-03-15 NOTE — Progress Notes (Addendum)
[facility] day: 03/15/2018   Chief Complaint: Brianna Rogers is a 72 y.o. female with stage IA right breast cancer who is seen for assessment after completion of breast radiation.  HPI:  The patient was last seen in the medical oncology clinic on 12/20/2017 after surgery. She was doing well.  Exam revealed post lumpectomy changes to her RIGHT breast. There was a 3-4 cm linear eschar. There was a underlying hematoma.   We discussed plans for post surgery therapy based on Oncotype testing.  Oncotype DX testing revealed a recurrence score of 9 (low risk).  Distant recurrencce risk at 9 years with hormonal therapy was 3%.  Absolute benefit of chemotherapy was < 1%.  She was seen by Dr. Baruch Gouty on 01/03/2018. Discussions were held about radiation consisting of 5040 cGy in 28 fractions. Her scar would be boosted another 1600 cGy based on the close margins.  She started radiation on 01/27/2018 and completed radiation on 03/03/3018.  During the interim, she has done well.  Her energy level is back.  She saw the dentist (Dr. Jamal Maes) who cleared her teeth for a bisphosphonate or Prolia.   Past Medical History:  Diagnosis Date  . Breast cancer ([place])   . Breast cancer, right ([place]) 11/18/2017  . Diabetes mellitus without complication ([place])   . Heart murmur   . Hypertension   . Lichen sclerosus et atrophicus     Past Surgical History:  Procedure Laterality Date  . ABDOMINAL HYSTERECTOMY    . APPENDECTOMY    . BREAST BIOPSY Right 11/18/2017   INVASIVE MAMMARY CARCINOMA.   Marland Kitchen BREAST LUMPECTOMY Right 12/06/2017   invasive mammary carcinoma  . COLONOSCOPY    . COLONOSCOPY WITH PROPOFOL N/A 11/15/2017   Procedure: COLONOSCOPY WITH PROPOFOL;  Surgeon: Lollie Sails, MD;  Location[facility] ENDOSCOPY;  Service: Endoscopy;  Laterality: N/A;  . PARTIAL MASTECTOMY WITH NEEDLE LOCALIZATION Right 12/06/2017   Procedure: PARTIAL MASTECTOMY WITH NEEDLE  LOCALIZATION;  Surgeon: Herbert Pun, MD;  Location: [facility] ORS;  Service: General;  Laterality: Right;  . SENTINEL NODE BIOPSY Right 12/06/2017   Procedure: SENTINEL NODE BIOPSY;  Surgeon: Herbert Pun, MD;  Location: [facility] ORS;  Service: General;  Laterality: Right;  . TUBAL LIGATION      Family History  Problem Relation Age of Onset  . Breast cancer Mother 1  . Cancer Mother   . Cancer Maternal Aunt   . Cancer Maternal Grandmother     Social History:  reports that she has never smoked. She has never used smokeless tobacco. She reports that she does not drink alcohol or use drugs.  She is an only child.  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  She travels every other day to her home in [place] to take care of her animals, but is planning to move back to [facility] soon. Patient is retired from [employer]. The patient is alone today.  Allergies:  Allergies  Allergen Reactions  . Bactrim [Sulfamethoxazole-Trimethoprim] Other (See Comments)    Unknown  . Macrodantin [Nitrofurantoin Macrocrystal] Other (See Comments)    Unknown  . Sulfa Antibiotics Other (See Comments)    Flu like symptoms    Current Medications: Current Outpatient Medications  Medication Sig Dispense Refill  . aspirin EC 81 MG tablet Take 81 mg by mouth every evening.     . Continuous Blood Gluc Sensor ([place]) MISC  Use 3 each every 10 (ten) days    . empagliflozin (JARDIANCE) 10 MG TABS tablet Take 10 mg by mouth daily.    . fluocinonide ointment (LIDEX) 0.05 % Apply topically. Apply topically daily. Apply to areas of rash daily x3 weeks per month; avoid use on face and skin folds    . glimepiride (AMARYL) 4 MG tablet Take 4 mg by mouth daily with breakfast.    . ibuprofen (ADVIL,MOTRIN) 200 MG tablet Take 200 mg by mouth every 6 (six) hours as needed for headache or moderate pain.    . metFORMIN (GLUCOPHAGE) 1000 MG tablet  Take 1,000 mg by mouth 2 (two) times daily with a meal.    . Multiple Vitamin (MULTIVITAMIN) tablet Take 1 tablet by mouth daily.    . Omega-3 Fatty Acids (FISH OIL) 1200 MG CAPS Take 2,400 mg by mouth 2 (two) times daily.    . pravastatin (PRAVACHOL) 80 MG tablet Take 80 mg by mouth every evening.     Marland Kitchen PREVIDENT 5000 BOOSTER PLUS 1.1 % PSTE   5  . quinapril (ACCUPRIL) 20 MG tablet Take 20 mg by mouth daily.     Marland Kitchen acetaminophen (TYLENOL) 500 MG tablet Take 500 mg by mouth every 6 (six) hours as needed for moderate pain or headache.    . letrozole (FEMARA) 2.5 MG tablet Take 1 tablet (2.5 mg total) by mouth daily. 30 tablet 0   No current facility-administered medications for this visit.     Review of Systems:  GENERAL:  Feels well.  Energy level is back.  No fevers, sweats or weight loss.  Weight up 3 pounds. PERFORMANCE STATUS (ECOG):  0 HEENT:  No visual changes, runny nose, sore throat, mouth sores or tenderness. Lungs: No shortness of breath or cough.  No hemoptysis. Cardiac:  No chest pain, palpitations, orthopnea, or PND. GI:  No nausea, vomiting, diarrhea, constipation, melena or hematochezia. GU:  No urgency, frequency, dysuria, or hematuria. Musculoskeletal:  Osteopenia.  No back pain.  No joint pain.  No muscle tenderness. Extremities:  No pain or swelling. Skin:  Abdominal rash secondary to lichen sclerosis et atrophicus.  Neuro:  No headache, numbness or weakness, balance or coordination issues. Endocrine:  No diabetes, thyroid issues, hot flashes or night sweats. Psych:  No mood changes, depression or anxiety. Pain:  No focal pain. Review of systems:  All other systems reviewed and found to be negative.   Physical Exam: Blood pressure (!) 147/81, pulse 75, temperature 97.7 F (36.5 C), temperature source Tympanic, weight 144 lb 12.8 oz (65.7 kg). GENERAL:  Well developed, well nourished, woman sitting comfortably in the exam room in no acute distress. MENTAL STATUS:   Alert and oriented to person, place and time. HEAD:  Styled gray hair.  Normocephalic, atraumatic, face symmetric, no Cushingoid features. EYES:  Glasses.  Brown eyes.  Pupils equal round and reactive to light and accomodation.  No conjunctivitis or scleral icterus. ENT:  Oropharynx clear without lesion.  Tongue normal. Mucous membranes moist.  RESPIRATORY:  Clear to auscultation without rales, wheezes or rhonchi. CARDIOVASCULAR:  Regular rate and rhythm without murmur, rub or gallop. BREAST:  Right breast s/p lumpectomy.  Hypopigmentation right axillae.  Inferior breast edema.  No nipple discharge.  Left breast with globular fullness superiorly.  No discrete masses, skin changes or nipple discharge.  ABDOMEN:  Soft, non-tender, with active bowel sounds, and no hepatosplenomegaly.  No masses. SKIN:  Lacy band like hypopigmentation across lower abdomen (see above).  No rashes, ulcers or lesions. EXTREMITIES: No edema, no skin discoloration or tenderness.  No palpable cords. LYMPH NODES: No palpable cervical, supraclavicular, axillary or inguinal adenopathy  NEUROLOGICAL: Unremarkable. PSYCH:  Appropriate.    Appointment on 03/15/2018  Component Date Value Ref Range Status  . CA 27.29 03/15/2018 22.5  0.0 - 38.6 U/mL Final   Comment: (NOTE) Siemens Centaur Immunochemiluminometric Methodology [facility]) Values obtained with different assay methods or kits cannot be used interchangeably. Results cannot be interpreted as absolute evidence of the presence or absence of malignant disease. Performed At: [facility] [place], [place] [id number] Rush Farmer MD [id number] Performed at [facility], [address], [place], [place] [address]   . PTH 03/15/2018 26  15 - 65 pg/mL Final  . Calcium, Total (PTH) 03/15/2018 10.1  8.7 - 10.3 mg/dL Final  . PTH Interp 03/15/2018 Comment   Final   Comment: (NOTE) Interpretation                 Intact PTH    Calcium                                 (pg/mL)      (mg/dL) Normal                          15 - 65     8.6 - 10.2 Primary Hyperparathyroidism         >65          >10.2 Secondary Hyperparathyroidism       >65          <10.2 Non-Parathyroid Hypercalcemia       <65          >10.2 Hypoparathyroidism                  <15          < 8.6 Non-Parathyroid Hypocalcemia    15 - 65          < 8.6 Performed At: [facility] [place], [place] [id number] Rush Farmer MD [phone] Performed at [facility], [address] [place], [place] [address]   . Sodium 03/15/2018 137  135 - 145 mmol/L Final  . Potassium 03/15/2018 4.6  3.5 - 5.1 mmol/L Final  . Chloride 03/15/2018 103  98 - 111 mmol/L Final   Please note change in reference range.  . CO2 03/15/2018 24  22 - 32 mmol/L Final  . Glucose, Bld 03/15/2018 224* 70 - 99 mg/dL Final   Please note change in reference range.  . BUN 03/15/2018 22  8 - 23 mg/dL Final   Please note change in reference range.  . Creatinine, Ser 03/15/2018 0.77  0.44 - 1.00 mg/dL Final  . Calcium 03/15/2018 10.0  8.9 - 10.3 mg/dL Final  . Total Protein 03/15/2018 7.6  6.5 - 8.1 g/dL Final  . Albumin 03/15/2018 4.1  3.5 - 5.0 g/dL Final  . AST 03/15/2018 19  15 - 41 U/L Final  . ALT 03/15/2018 22  0 - 44 U/L Final   Please note change in reference range.  . Alkaline Phosphatase 03/15/2018 63  38 - 126 U/L Final  . Total Bilirubin 03/15/2018 0.7  0.3 - 1.2 mg/dL Final  . GFR calc non Af Amer 03/15/2018 >60  >60 mL/min Final  . GFR calc  Af Amer 03/15/2018 >60  >60 mL/min Final   Comment: (NOTE) The eGFR has been calculated using the CKD EPI equation. This calculation has not been validated in all clinical situations. eGFR's persistently <60 mL/min signify possible Chronic Kidney Disease.   Georgiann Hahn gap 03/15/2018 10  5 - 15 Final   Performed at [facility], [address] [place], [place] [address]  . WBC 03/15/2018 7.4  3.6 - 11.0 K/uL  Final  . RBC 03/15/2018 4.68  3.80 - 5.20 MIL/uL Final  . Hemoglobin 03/15/2018 14.7  12.0 - 16.0 g/dL Final  . HCT 03/15/2018 42.6  35.0 - 47.0 % Final  . MCV 03/15/2018 90.9  80.0 - 100.0 fL Final  . MCH 03/15/2018 31.3  26.0 - 34.0 pg Final  . MCHC 03/15/2018 34.4  32.0 - 36.0 g/dL Final  . RDW 03/15/2018 13.6  11.5 - 14.5 % Final  . Platelets 03/15/2018 199  150 - 440 K/uL Final  . Neutrophils Relative % 03/15/2018 68  % Final  . Neutro Abs 03/15/2018 5.0  1.4 - 6.5 K/uL Final  . Lymphocytes Relative 03/15/2018 19  % Final  . Lymphs Abs 03/15/2018 1.4  1.0 - 3.6 K/uL Final  . Monocytes Relative 03/15/2018 8  % Final  . Monocytes Absolute 03/15/2018 0.6  0.2 - 0.9 K/uL Final  . Eosinophils Relative 03/15/2018 4  % Final  . Eosinophils Absolute 03/15/2018 0.3  0 - 0.7 K/uL Final  . Basophils Relative 03/15/2018 1  % Final  . Basophils Absolute 03/15/2018 0.1  0 - 0.1 K/uL Final   Performed at [facility], [address] [place], [place] [address]    Assessment:  Brianna Rogers is a 72 y.o. female with stage 1A right breast cancer s/p lumpectomy on 12/06/2017.  Pathology revealed a 1.7 cm grade III invasive mammary carcinoma of no special type.  There were biopsy site changes and a clip present.  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  Caudal margin re-excision revealed ductal and lobular carcinoma in situ.  In situ carcinoma was < 0.5 mm from the margin.  Thermal artifact limited interpretation.  New skin margin revealed in situ carcinoma < 0.5 mm from the surgical margin.  There was lymphovascular invasion.  One sentinel lymph node was negative.  Tumor was ER + (> 90%), PR + (> 90%) and Her2/neu 2+ (FISH negative).  Pathologic stage was pT1cN0.  Oncotype DX revealed a recurrence score of 9 (low risk).  Distant recurrencce risk at 9 years with hormonal therapy was 3%.  Absolute benefit of chemotherapy was < 1%.  Diagnostic right mammogram and ultrasound on 11/11/2017  revealed a suspicious mass in the 9 o'clock position of the right breast.  Targeted ultrasound revealed a 1.5 x 2.2 x 1.9 cm mass with posterior acoustic shadowing 10 cm from the nipple.  The right axilla was negative for adenopathy.  She received radiation from 01/27/2018 - 03/03/2018.  CA27.29 has been followed: 24.7 on 11/24/2017 and 22.5 on 03/15/2018.  Bone density on 12/09/2015 revealed osteopenia with a T-score of -1.3 in the left hip.  Bone density on 12/14/2017 revealed osteopenia with a T-score of -1.4 in the left femoral neck.  She has a family history of breast cancer x 4 (mother and 3 paternal aunts) and "female cancer" (maternal great grandmother).  [facility] genetic testing was negative for BRCA1/2.  Symptomatically, she is doing well.  Exam is stable.  Calcium has been elevated in  the past (supplemental calcium on hold).  Plan: 1. Discuss Oncotype DX results- low recurrence risk with hormonal therapy alone.  Discuss plan to start hormonal therapy.  Side effects reviewed. 2. Discuss radiation course. 3. Labs today: CBC with diff, CMP, CA27.29, PTH. 4. Femara Rx. 5. PA Prolia. 6. Discuss history of elevated calcium.  Supplemental calcium on hold.  Work-up. 7. RTC in 1 month for MD assessment, labs (CMP), and Prolia injection.    Lequita Asal, MD 03/15/2018, 5:35 PM

## 2018-03-15 NOTE — Progress Notes (Signed)
Patient here for follow up. Has new dx: lichen sclerosus (extragenital type).

## 2018-03-15 NOTE — Patient Instructions (Signed)
Letrozole tablets What is this medicine? LETROZOLE (LET roe zole) blocks the production of estrogen. It is used to treat breast cancer. This medicine may be used for other purposes; ask your health care provider or pharmacist if you have questions. COMMON BRAND NAME(S): Femara What should I tell my health care provider before I take this medicine? They need to know if you have any of these conditions: -high cholesterol -liver disease -osteoporosis (weak bones) -an unusual or allergic reaction to letrozole, other medicines, foods, dyes, or preservatives -pregnant or trying to get pregnant -breast-feeding How should I use this medicine? Take this medicine by mouth with a glass of water. You may take it with or without food. Follow the directions on the prescription label. Take your medicine at regular intervals. Do not take your medicine more often than directed. Do not stop taking except on your doctor's advice. Talk to your pediatrician regarding the use of this medicine in children. Special care may be needed. Overdosage: If you think you have taken too much of this medicine contact a poison control center or emergency room at once. NOTE: This medicine is only for you. Do not share this medicine with others. What if I miss a dose? If you miss a dose, take it as soon as you can. If it is almost time for your next dose, take only that dose. Do not take double or extra doses. What may interact with this medicine? Do not take this medicine with any of the following medications: -estrogens, like hormone replacement therapy or birth control pills This medicine may also interact with the following medications: -dietary supplements such as androstenedione or DHEA -prasterone -tamoxifen This list may not describe all possible interactions. Give your health care provider a list of all the medicines, herbs, non-prescription drugs, or dietary supplements you use. Also tell them if you smoke, drink  alcohol, or use illegal drugs. Some items may interact with your medicine. What should I watch for while using this medicine? Tell your doctor or healthcare professional if your symptoms do not start to get better or if they get worse. Do not become pregnant while taking this medicine or for 3 weeks after stopping it. Women should inform their doctor if they wish to become pregnant or think they might be pregnant. There is a potential for serious side effects to an unborn child. Talk to your health care professional or pharmacist for more information. Do not breast-feed while taking this medicine or for 3 weeks after stopping it. This medicine may interfere with the ability to have a child. Talk with your doctor or health care professional if you are concerned about your fertility. Using this medicine for a long time may increase your risk of low bone mass. Talk to your doctor about bone health. You may get drowsy or dizzy. Do not drive, use machinery, or do anything that needs mental alertness until you know how this medicine affects you. Do not stand or sit up quickly, especially if you are an older patient. This reduces the risk of dizzy or fainting spells. You may need blood work done while you are taking this medicine. What side effects may I notice from receiving this medicine? Side effects that you should report to your doctor or health care professional as soon as possible: -allergic reactions like skin rash, itching, or hives -bone fracture -chest pain -signs and symptoms of a blood clot such as breathing problems; changes in vision; chest pain; severe, sudden headache; pain, swelling,   warmth in the leg; trouble speaking; sudden numbness or weakness of the face, arm or leg -vaginal bleeding Side effects that usually do not require medical attention (report to your doctor or health care professional if they continue or are bothersome): -bone, back, joint, or muscle  pain -dizziness -fatigue -fluid retention -headache -hot flashes, night sweats -nausea -weight gain This list may not describe all possible side effects. Call your doctor for medical advice about side effects. You may report side effects to FDA at 1-800-FDA-1088. Where should I keep my medicine? Keep out of the reach of children. Store between 15 and 30 degrees C (59 and 86 degrees F). Throw away any unused medicine after the expiration date. NOTE: This sheet is a summary. It may not cover all possible information. If you have questions about this medicine, talk to your doctor, pharmacist, or health care provider.  2018 Elsevier/Gold Standard (2016-04-13 11:10:41)  

## 2018-03-16 ENCOUNTER — Ambulatory Visit: Payer: Medicare Other

## 2018-03-16 LAB — PTH, INTACT AND CALCIUM
CALCIUM TOTAL (PTH): 10.1 mg/dL (ref 8.7–10.3)
PTH: 26 pg/mL (ref 15–65)

## 2018-03-16 LAB — CANCER ANTIGEN 27.29: CAN 27.29: 22.5 U/mL (ref 0.0–38.6)

## 2018-03-17 ENCOUNTER — Ambulatory Visit: Payer: Medicare Other

## 2018-03-18 ENCOUNTER — Ambulatory Visit: Payer: Medicare Other

## 2018-03-23 IMAGING — MG MM BREAST SURGICAL SPECIMEN
1 series · 1 of 1 positions shown · non-contrast
Comparison: Previous exam(s).

CLINICAL DATA: Status post wire localized RIGHT lumpectomy.

EXAM:
SPECIMEN RADIOGRAPH OF THE RIGHT BREAST

[R SPECIMEN]
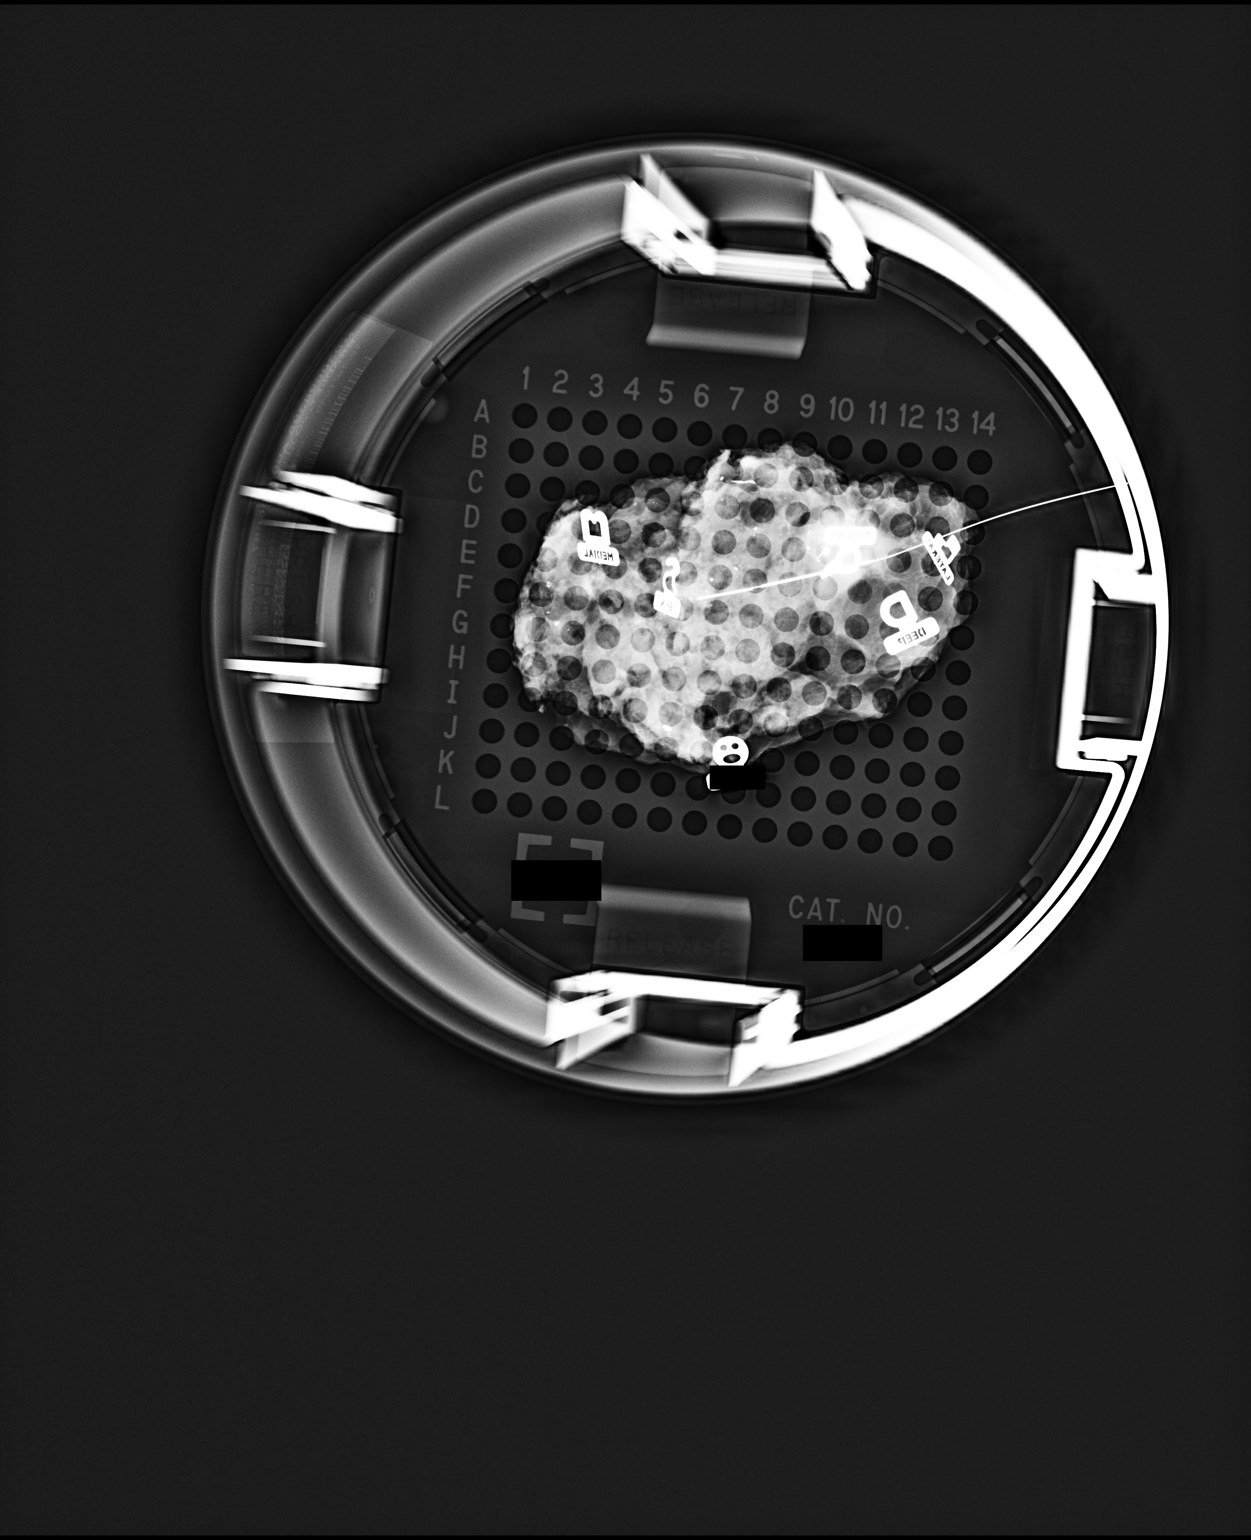

[1 of 1 positions shown; findings below may reference images not displayed]

FINDINGS: Status post excision of the right breast. The wire tip and biopsy
marker clip are present and are marked for pathology. Findings are
discussed with Dr. Nahimana at the time of interpretation.
IMPRESSION: Specimen radiograph of the right breast.

## 2018-04-12 ENCOUNTER — Other Ambulatory Visit: Payer: Self-pay | Admitting: Urgent Care

## 2018-04-15 ENCOUNTER — Encounter: Payer: Self-pay | Admitting: Radiation Oncology

## 2018-04-15 ENCOUNTER — Ambulatory Visit
Admission: RE | Admit: 2018-04-15 | Discharge: 2018-04-15 | Disposition: A | Discharge status: Home / Self Care | Payer: Medicare Other | Source: Ambulatory Visit | Attending: Radiation Oncology | Admitting: Radiation Oncology

## 2018-04-15 ENCOUNTER — Encounter: Payer: Self-pay | Admitting: Hematology and Oncology

## 2018-04-15 ENCOUNTER — Inpatient Hospital Stay: Payer: Medicare Other | Attending: Hematology and Oncology

## 2018-04-15 ENCOUNTER — Inpatient Hospital Stay (HOSPITAL_BASED_OUTPATIENT_CLINIC_OR_DEPARTMENT_OTHER): Payer: Medicare Other | Admitting: Hematology and Oncology

## 2018-04-15 ENCOUNTER — Other Ambulatory Visit: Payer: Self-pay

## 2018-04-15 ENCOUNTER — Inpatient Hospital Stay: Payer: Medicare Other

## 2018-04-15 VITALS — BP 143/79 | HR 94 | Temp 97.9°F | Resp 18 | Wt 142.2 lb

## 2018-04-15 VITALS — BP 147/82 | HR 89 | Temp 96.5°F | Resp 18 | Wt 142.2 lb

## 2018-04-15 DIAGNOSIS — M8580 Other specified disorders of bone density and structure, unspecified site: Secondary | ICD-10-CM | POA: Insufficient documentation

## 2018-04-15 DIAGNOSIS — Z170 Estrogen receptor positive status [ER+]: Secondary | ICD-10-CM | POA: Diagnosis not present

## 2018-04-15 DIAGNOSIS — Z923 Personal history of irradiation: Secondary | ICD-10-CM | POA: Insufficient documentation

## 2018-04-15 DIAGNOSIS — M85852 Other specified disorders of bone density and structure, left thigh: Secondary | ICD-10-CM

## 2018-04-15 DIAGNOSIS — C50411 Malignant neoplasm of upper-outer quadrant of right female breast: Secondary | ICD-10-CM

## 2018-04-15 DIAGNOSIS — C50811 Malignant neoplasm of overlapping sites of right female breast: Secondary | ICD-10-CM | POA: Diagnosis not present

## 2018-04-15 DIAGNOSIS — Z79811 Long term (current) use of aromatase inhibitors: Secondary | ICD-10-CM | POA: Insufficient documentation

## 2018-04-15 LAB — CBC
HEMATOCRIT: 44.1 % (ref 35.0–47.0)
HEMOGLOBIN: 15.1 g/dL (ref 12.0–16.0)
MCH: 31.1 pg (ref 26.0–34.0)
MCHC: 34.2 g/dL (ref 32.0–36.0)
MCV: 90.9 fL (ref 80.0–100.0)
Platelets: 191 10*3/uL (ref 150–440)
RBC: 4.85 MIL/uL (ref 3.80–5.20)
RDW: 13.2 % (ref 11.5–14.5)
WBC: 7.7 10*3/uL (ref 3.6–11.0)

## 2018-04-15 LAB — COMPREHENSIVE METABOLIC PANEL
ALT: 20 U/L (ref 0–44)
AST: 18 U/L (ref 15–41)
Albumin: 4.1 g/dL (ref 3.5–5.0)
Alkaline Phosphatase: 58 U/L (ref 38–126)
Anion gap: 10 (ref 5–15)
BUN: 30 mg/dL — ABNORMAL HIGH (ref 8–23)
CO2: 24 mmol/L (ref 22–32)
Calcium: 10.2 mg/dL (ref 8.9–10.3)
Chloride: 104 mmol/L (ref 98–111)
Creatinine, Ser: 0.82 mg/dL (ref 0.44–1.00)
GFR calc Af Amer: >60 mL/min (ref >60)
GFR calc non Af Amer: >60 mL/min (ref >60)
Glucose, Bld: 264 mg/dL — ABNORMAL HIGH (ref 70–99)
Potassium: 4.6 mmol/L (ref 3.5–5.1)
Sodium: 138 mmol/L (ref 135–145)
Total Bilirubin: 0.7 mg/dL (ref 0.3–1.2)
Total Protein: 7.5 g/dL (ref 6.5–8.1)

## 2018-04-15 MED ORDER — LETROZOLE 2.5 MG PO TABS: 2.5000 mg | ORAL_TABLET | Freq: Every day | ORAL | 3 refills | Status: DC

## 2018-04-15 MED ORDER — DENOSUMAB 60 MG/ML ~~LOC~~ SOSY
60.0000 mg | PREFILLED_SYRINGE | Freq: Once | SUBCUTANEOUS | Status: AC
  Administered 2018-04-15: 60 mg via SUBCUTANEOUS
  Filled 2018-04-15: qty 1

## 2018-04-15 NOTE — Progress Notes (Signed)
Radiation Oncology Follow up Note  Name: Brianna Rogers   Date:   04/15/2018 MRN:  [id number] DOB: 08/27/1946    This 72 y.o. 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  REFERRING PROVIDER: Gauger, Sarah Kathryn, *  HPI:P atient is a 72-year-old female now seen out 1 month having completed whole breast radiation to her right breast for stage 1 invasive mammary carcinoma. ER/PR positive HER-2/neu negative. She is seen today in routine follow-up and is doing well. She specifically denies breast tenderness cough or bone pain. She has been started on Femara is tolerating that well without side effect.  COMPLICATIONS OF TREATMENT: none  FOLLOW UP COMPLIANCE: keeps appointments   PHYSICAL EXAM:  BP (!) 143/79   Pulse 94   Temp 97.9 F (36.6 C)   Resp 18   Wt 142 lb 3.2 oz (64.5 kg)   BMI 28.24 kg/m  Lungs are clear to A&P cardiac examination essentially unremarkable with regular rate and rhythm. No dominant mass or nodularity is noted in either breast in 2 positions examined. Incision is well-healed. No axillary or supraclavicular adenopathy is appreciated. Cosmetic result is excellent. Well-developed well-nourished patient in NAD. HEENT reveals PERLA, EOMI, discs not visualized.  Oral cavity is clear. No oral mucosal lesions are identified. Neck is clear without evidence of cervical or supraclavicular adenopathy. Lungs are clear to A&P. Cardiac examination is essentially unremarkable with regular rate and rhythm without murmur rub or thrill. Abdomen is benign with no organomegaly or masses noted. Motor sensory and DTR levels are equal and symmetric in the upper and lower extremities. Cranial nerves II through XII are grossly intact. Proprioception is intact. No peripheral adenopathy or edema is identified. No motor or sensory levels are noted. Crude visual fields are  within normal range.  RADIOLOGY RESULTS:n o current films for review  PLAN: at the present time patient is doing well 1 month out from whole breast radiation. I'm please were overall progress. She continues on Femara without side effect. I have asked to see her back in 4-5 months for follow-up. Patient is to call sooner with any concerns.  I would like to take this opportunity to thank you for allowing me to participate in the care of your patient..    Glenn Chrystal, MD   

## 2018-04-15 NOTE — Progress Notes (Signed)
Here for follow up. Stated overall "feels good "

## 2018-04-15 NOTE — Progress Notes (Signed)
[place] [facility] day:  04/15/2018   Chief Complaint: Brianna Rogers is a 72 y.o. female with stage IA right breast cancer who is seen for 1 month assessment on Femara.  HPI:  The patient was last seen in the medical oncology clinic on 03/15/2018 after completion of breast radiation. She was doing well.  Exam was stable.  Calcium was elevated (supplemental calcium on hold).  She began Femara.  Labs at last visit included a calcium of 10 and albumin 4.1.  PTH was 26 (normal)  During the interim, patient has been doing well on her new Femara. She notes a few very minor hot flashes. She denies fatigue and arthralgias.  Patient does not verbalize any concerns with regards to her breasts today. Patient performs monthly self breast examinations as recommended. Patient denies that she has experienced any B symptoms. She denies any interval infections.   Patient advises that she maintains an adequate appetite. She is eating well. Weight today is 142 lb 3.2 oz (64.5 kg), which compared to her last visit to the clinic, represents a  2 pound decrease. She is on a new diet plan. She is on Whole-30 and walking at least 3 days a week.   Patient denies pain in the clinic today.   Past Medical History:  Diagnosis Date  . Breast cancer ([place])   . Breast cancer, right ([place]) 11/18/2017  . Diabetes mellitus without complication ([place])   . Heart murmur   . Hypertension   . Lichen sclerosus et atrophicus     Past Surgical History:  Procedure Laterality Date  . ABDOMINAL HYSTERECTOMY    . APPENDECTOMY    . BREAST BIOPSY Right 11/18/2017   INVASIVE MAMMARY CARCINOMA.   Marland Kitchen BREAST LUMPECTOMY Right 12/06/2017   invasive mammary carcinoma  . COLONOSCOPY    . COLONOSCOPY WITH PROPOFOL N/A 11/15/2017   Procedure: COLONOSCOPY WITH PROPOFOL;  Surgeon: Lollie Sails, MD;  Location: [facility] ENDOSCOPY;  Service: Endoscopy;  Laterality: N/A;  . PARTIAL MASTECTOMY WITH NEEDLE  LOCALIZATION Right 12/06/2017   Procedure: PARTIAL MASTECTOMY WITH NEEDLE LOCALIZATION;  Surgeon: Herbert Pun, MD;  Location: [facility] ORS;  Service: General;  Laterality: Right;  . SENTINEL NODE BIOPSY Right 12/06/2017   Procedure: SENTINEL NODE BIOPSY;  Surgeon: Herbert Pun, MD;  Location: [facility] ORS;  Service: General;  Laterality: Right;  . TUBAL LIGATION      Family History  Problem Relation Age of Onset  . Breast cancer Mother 54  . Cancer Mother   . Cancer Maternal Aunt   . Cancer Maternal Grandmother     Social History:  reports that she has never smoked. She has never used smokeless tobacco. She reports that she does not drink alcohol or use drugs.  She is an only child.  She is the primary caregiver for her 1 year old mother who lives in [place].  She travels every other day to her home in [place] to take care of her animals, but is planning to move back to [facility] soon. Patient is retired from [employer]. The patient is alone today.  Allergies:  Allergies  Allergen Reactions  . Bactrim [Sulfamethoxazole-Trimethoprim] Other (See Comments)    Unknown  . Macrodantin [Nitrofurantoin Macrocrystal] Other (See Comments)    Unknown  . Sulfa Antibiotics Other (See Comments)    Flu like symptoms    Current Medications: Current Outpatient Medications  Medication Sig Dispense Refill  . aspirin EC 81 MG  tablet Take 81 mg by mouth every evening.     . Continuous Blood Gluc Sensor (FREESTYLE LIBRE SENSOR SYSTEM) MISC Use 3 each every 10 (ten) days    . empagliflozin (JARDIANCE) 10 MG TABS tablet Take 10 mg by mouth daily.    . fluocinonide ointment (LIDEX) 0.05 % Apply topically. Apply topically daily. Apply to areas of rash daily x3 weeks per month; avoid use on face and skin folds    . glimepiride (AMARYL) 4 MG tablet Take 4 mg by mouth daily with breakfast.    . letrozole (FEMARA) 2.5 MG tablet Take 1 tablet (2.5 mg total) by mouth daily. 30 tablet 0  .  metFORMIN (GLUCOPHAGE) 1000 MG tablet Take 1,000 mg by mouth 2 (two) times daily with a meal.    . Multiple Vitamin (MULTIVITAMIN) tablet Take 1 tablet by mouth daily.    . Omega-3 Fatty Acids (FISH OIL) 1200 MG CAPS Take 2,400 mg by mouth 2 (two) times daily.    . pravastatin (PRAVACHOL) 80 MG tablet Take 80 mg by mouth every evening.     Marland Kitchen PREVIDENT 5000 BOOSTER PLUS 1.1 % PSTE   5  . quinapril (ACCUPRIL) 20 MG tablet Take 20 mg by mouth daily.     Marland Kitchen acetaminophen (TYLENOL) 500 MG tablet Take 500 mg by mouth every 6 (six) hours as needed for moderate pain or headache.    . ibuprofen (ADVIL,MOTRIN) 200 MG tablet Take 200 mg by mouth every 6 (six) hours as needed for headache or moderate pain.     No current facility-administered medications for this visit.     Review of Systems:  GENERAL:  Feels good.  No fevers, sweats.  Weight loss of 2 pounds. PERFORMANCE STATUS (ECOG):  0 HEENT:  No visual changes, runny nose, sore throat, mouth sores or tenderness. Lungs: No shortness of breath or cough.  No hemoptysis. Cardiac:  No chest pain, palpitations, orthopnea, or PND. GI:  No nausea, vomiting, diarrhea, constipation, melena or hematochezia. GU:  No urgency, frequency, dysuria, or hematuria. Musculoskeletal:  Osteopenia.  No back pain.  No joint pain.  No muscle tenderness. Extremities:  No pain or swelling. Skin:  No rashes or skin changes. Neuro:  No headache, numbness or weakness, balance or coordination issues. Endocrine:  Hyperglycemia after apple tart.  No diabetes, thyroid issues.  Hot flashes every other days.  No night sweats. Psych:  No mood changes, depression or anxiety. Pain:  No focal pain. Review of systems:  All other systems reviewed and found to be negative.  Physical Exam: Blood pressure (!) 147/82, pulse 89, temperature (!) 96.5 F (35.8 C), temperature source Tympanic, resp. rate 18, weight 142 lb 3.2 oz (64.5 kg). GENERAL:  Well developed, well nourished, woman  sitting comfortably in the exam room in no acute distress. MENTAL STATUS:  Alert and oriented to person, place and time. HEAD:  Short gray hair.  Normocephalic, atraumatic, face symmetric, no Cushingoid features. EYES:  Glasses.  Brown eyes.  Pupils equal round and reactive to light and accomodation.  No conjunctivitis or scleral icterus. ENT:  Oropharynx clear without lesion.  Tongue normal. Mucous membranes moist.  RESPIRATORY:  Clear to auscultation without rales, wheezes or rhonchi. CARDIOVASCULAR:  Regular rate and rhythm without murmur, rub or gallop. ABDOMEN:  Soft, non-tender, with active bowel sounds, and no hepatosplenomegaly.  No masses. SKIN:  No rashes, ulcers or lesions. EXTREMITIES: No edema, no skin discoloration or tenderness.  No palpable cords. LYMPH NODES: No  palpable cervical, supraclavicular, axillary or inguinal adenopathy  NEUROLOGICAL: Unremarkable. PSYCH:  Appropriate.    Orders Only on 04/15/2018  Component Date Value Ref Range Status  . Sodium 04/15/2018 138  135 - 145 mmol/L Final  . Potassium 04/15/2018 4.6  3.5 - 5.1 mmol/L Final  . Chloride 04/15/2018 104  98 - 111 mmol/L Final  . CO2 04/15/2018 24  22 - 32 mmol/L Final  . Glucose, Bld 04/15/2018 264* 70 - 99 mg/dL Final  . BUN 04/15/2018 30* 8 - 23 mg/dL Final  . Creatinine, Ser 04/15/2018 0.82  0.44 - 1.00 mg/dL Final  . Calcium 04/15/2018 10.2  8.9 - 10.3 mg/dL Final  . Total Protein 04/15/2018 7.5  6.5 - 8.1 g/dL Final  . Albumin 04/15/2018 4.1  3.5 - 5.0 g/dL Final  . AST 04/15/2018 18  15 - 41 U/L Final  . ALT 04/15/2018 20  0 - 44 U/L Final  . Alkaline Phosphatase 04/15/2018 58  38 - 126 U/L Final  . Total Bilirubin 04/15/2018 0.7  0.3 - 1.2 mg/dL Final  . GFR calc non Af Amer 04/15/2018 >60  >60 mL/min Final  . GFR calc Af Amer 04/15/2018 >60  >60 mL/min Final   Comment: (NOTE) The eGFR has been calculated using the CKD EPI equation. This calculation has not been validated in all clinical  situations. eGFR's persistently <60 mL/min signify possible Chronic Kidney Disease.   Georgiann Hahn gap 04/15/2018 10  5 - 15 Final   Performed at [facility], [address] [place], [place] [address]  . WBC 04/15/2018 7.7  3.6 - 11.0 K/uL Final  . RBC 04/15/2018 4.85  3.80 - 5.20 MIL/uL Final  . Hemoglobin 04/15/2018 15.1  12.0 - 16.0 g/dL Final  . HCT 04/15/2018 44.1  35.0 - 47.0 % Final  . MCV 04/15/2018 90.9  80.0 - 100.0 fL Final  . MCH 04/15/2018 31.1  26.0 - 34.0 pg Final  . MCHC 04/15/2018 34.2  32.0 - 36.0 g/dL Final  . RDW 04/15/2018 13.2  11.5 - 14.5 % Final  . Platelets 04/15/2018 191  150 - 440 K/uL Final   Performed at [facility], [address] [place], [place] [address]    Assessment:  Brianna Rogers is a 72 y.o. female with stage 1A right breast cancer s/p lumpectomy on 12/06/2017.  Pathology revealed a 1.7 cm grade III invasive mammary carcinoma of no special type.  There were biopsy site changes and a clip present.  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  Caudal margin re-excision revealed ductal and lobular carcinoma in situ.  In situ carcinoma was < 0.5 mm from the margin.  Thermal artifact limited interpretation.  New skin margin revealed in situ carcinoma < 0.5 mm from the surgical margin.  There was lymphovascular invasion.  One sentinel lymph node was negative.  Tumor was ER + (> 90%), PR + (> 90%) and Her2/neu 2+ (FISH negative).  Pathologic stage was pT1cN0.  Oncotype DX revealed a recurrence score of 9 (low risk).  Distant recurrencce risk at 9 years with hormonal therapy was 3%.  Absolute benefit of chemotherapy was < 1%.  Diagnostic right mammogram and ultrasound on 11/11/2017 revealed a suspicious mass in the 9 o'clock position of the right breast.  Targeted ultrasound revealed a 1.5 x 2.2 x 1.9 cm mass with posterior acoustic shadowing 10 cm from the nipple.  The right axilla was negative for adenopathy.  She received radiation from  01/27/2018 - 03/03/2018.  She began Femara on 03/15/2018.  CA27.29 has been followed: 24.7 on 11/24/2017 and 22.5 on 03/15/2018.  Bone density on 12/09/2015 revealed osteopenia with a T-score of -1.3 in the left hip.  Bone density on 12/14/2017 revealed osteopenia with a T-score of -1.4 in the left femoral neck.  She has a family history of breast cancer x 4 (mother and 3 paternal aunts) and "female cancer" (maternal great grandmother).  [facility] genetic testing was negative for BRCA1/2.  Symptomatically, she is doing well on Femara.  Exam is stable.  Calcium is 10.2.  Plan: 1. Labs today: CMP. 2. Hypercalcemia:  Review labs from last visit.  Calcium and PTH normal. 3. Breast cancer:  Continue Femara as previously prescribed.  4. Osteopenia:  Prolia today. 5. Routine surveillance schedule reviewed with patient. Patient will be seen in the medical oncology clinic every 3 months for the first year, every 4 months for years 2-3, every 6 months for years 4-5, and then annually thereafter.  6. RTC in 1 month for BMP and consideration of initiation of calcium. 7. RTC in 3 months for MD assessment and labs (CBC with diff, CMP).    Honor Loh, NP 04/15/2018, 12:24 PM   I saw and evaluated the patient, participating in the key portions of the service and reviewing pertinent diagnostic studies and records.  I reviewed the nurse practitioner's note and agree with the findings and the plan.  The assessment and plan were discussed with the patient.  Several questions were asked by the patient and answered.   Nolon Stalls, MD 04/15/2018,12:24 PM

## 2018-04-25 ENCOUNTER — Telehealth: Payer: Self-pay | Admitting: *Deleted

## 2018-04-25 NOTE — Telephone Encounter (Signed)
Attempted to return call to patient. No answer. LMOM asking that patient return call to [phone] tomorrow to speak with Rodena Piety.   Rodena Piety, if she needs to speak directly to me, please let me know and I will touch base with her. Please find out the nature of her call if you will.  Honor Loh, MSN, APRN, FNP-C, CEN Oncology/Hematology Nurse Practitioner  [facility] 04/25/18, 6:28 PM

## 2018-04-25 NOTE — Telephone Encounter (Signed)
Patient called requesting that Brianna Rogers return her call [phone]

## 2018-04-26 ENCOUNTER — Telehealth: Payer: Self-pay | Admitting: *Deleted

## 2018-04-26 NOTE — Telephone Encounter (Signed)
Patient called and LVM for me to call her regarding numbness/tingling in her right fingers.  Patient states she recently got Prolia injection and thought it might be the reason.  Patient is also on Letrozole.  I called patient back to inform her that mostl likely the Letroziole is what [place] causing her symptoms.  Numbness and tingling is not a side effect of Prolia.

## 2018-05-03 ENCOUNTER — Encounter: Payer: Self-pay | Admitting: Hematology and Oncology

## 2018-05-16 ENCOUNTER — Inpatient Hospital Stay: Payer: Medicare Other | Attending: Hematology and Oncology

## 2018-05-16 DIAGNOSIS — C50411 Malignant neoplasm of upper-outer quadrant of right female breast: Secondary | ICD-10-CM

## 2018-05-16 DIAGNOSIS — C50911 Malignant neoplasm of unspecified site of right female breast: Secondary | ICD-10-CM | POA: Insufficient documentation

## 2018-05-16 LAB — BASIC METABOLIC PANEL
Anion gap: 11 (ref 5–15)
BUN: 26 mg/dL — ABNORMAL HIGH (ref 8–23)
CO2: 22 mmol/L (ref 22–32)
Calcium: 10.1 mg/dL (ref 8.9–10.3)
Chloride: 103 mmol/L (ref 98–111)
Creatinine, Ser: 0.67 mg/dL (ref 0.44–1.00)
GFR calc Af Amer: >60 mL/min (ref >60)
GFR calc non Af Amer: >60 mL/min (ref >60)
Glucose, Bld: 210 mg/dL — ABNORMAL HIGH (ref 70–99)
Potassium: 4.3 mmol/L (ref 3.5–5.1)
Sodium: 136 mmol/L (ref 135–145)

## 2018-07-15 ENCOUNTER — Ambulatory Visit: Payer: Medicare Other

## 2018-07-15 ENCOUNTER — Inpatient Hospital Stay: Payer: Medicare Other | Admitting: Urgent Care

## 2018-07-15 ENCOUNTER — Inpatient Hospital Stay: Payer: Medicare Other

## 2018-07-28 ENCOUNTER — Other Ambulatory Visit: Payer: Self-pay

## 2018-07-28 ENCOUNTER — Inpatient Hospital Stay: Payer: Medicare Other | Attending: Hematology and Oncology

## 2018-07-28 ENCOUNTER — Inpatient Hospital Stay (HOSPITAL_BASED_OUTPATIENT_CLINIC_OR_DEPARTMENT_OTHER): Payer: Medicare Other | Admitting: Hematology and Oncology

## 2018-07-28 ENCOUNTER — Other Ambulatory Visit: Payer: Self-pay | Admitting: Urgent Care

## 2018-07-28 ENCOUNTER — Encounter: Payer: Self-pay | Admitting: Hematology and Oncology

## 2018-07-28 VITALS — BP 147/85 | HR 69 | Temp 96.8°F | Resp 18 | Wt 144.4 lb

## 2018-07-28 DIAGNOSIS — M85852 Other specified disorders of bone density and structure, left thigh: Secondary | ICD-10-CM

## 2018-07-28 DIAGNOSIS — C50411 Malignant neoplasm of upper-outer quadrant of right female breast: Secondary | ICD-10-CM

## 2018-07-28 DIAGNOSIS — Z7982 Long term (current) use of aspirin: Secondary | ICD-10-CM

## 2018-07-28 DIAGNOSIS — Z79899 Other long term (current) drug therapy: Secondary | ICD-10-CM

## 2018-07-28 LAB — COMPREHENSIVE METABOLIC PANEL
ALT: 21 U/L (ref 0–44)
AST: 17 U/L (ref 15–41)
Albumin: 4 g/dL (ref 3.5–5.0)
Alkaline Phosphatase: 47 U/L (ref 38–126)
Anion gap: 7 (ref 5–15)
BUN: 18 mg/dL (ref 8–23)
CO2: 26 mmol/L (ref 22–32)
Calcium: 10 mg/dL (ref 8.9–10.3)
Chloride: 103 mmol/L (ref 98–111)
Creatinine, Ser: 0.7 mg/dL (ref 0.44–1.00)
GFR calc Af Amer: >60 mL/min (ref >60)
GFR calc non Af Amer: >60 mL/min (ref >60)
Glucose, Bld: 188 mg/dL — ABNORMAL HIGH (ref 70–99)
Potassium: 4.8 mmol/L (ref 3.5–5.1)
Sodium: 136 mmol/L (ref 135–145)
Total Bilirubin: 0.8 mg/dL (ref 0.3–1.2)
Total Protein: 7.3 g/dL (ref 6.5–8.1)

## 2018-07-28 LAB — CBC WITH DIFFERENTIAL/PLATELET
Abs Immature Granulocytes: 0.03 10*3/uL (ref 0.00–0.07)
Basophils Absolute: 0 10*3/uL (ref 0.0–0.1)
Basophils Relative: 1 %
Eosinophils Absolute: 0.2 10*3/uL (ref 0.0–0.5)
Eosinophils Relative: 3 %
HCT: 42.6 % (ref 36.0–46.0)
Hemoglobin: 14.5 g/dL (ref 12.0–15.0)
Immature Granulocytes: 0 %
Lymphocytes Relative: 24 %
Lymphs Abs: 1.8 10*3/uL (ref 0.7–4.0)
MCH: 30.5 pg (ref 26.0–34.0)
MCHC: 34 g/dL (ref 30.0–36.0)
MCV: 89.5 fL (ref 80.0–100.0)
Monocytes Absolute: 0.6 10*3/uL (ref 0.1–1.0)
Monocytes Relative: 8 %
Neutro Abs: 5 10*3/uL (ref 1.7–7.7)
Neutrophils Relative %: 64 %
Platelets: 197 10*3/uL (ref 150–400)
RBC: 4.76 MIL/uL (ref 3.87–5.11)
RDW: 12.4 % (ref 11.5–15.5)
WBC: 7.8 10*3/uL (ref 4.0–10.5)
nRBC: 0 % (ref 0.0–0.2)

## 2018-07-28 NOTE — Progress Notes (Signed)
[facility] day:  07/28/2018   Chief Complaint: Brianna Rogers is a 72 y.o. female with stage IA right breast cancer and osteopenia who is seen for 3 month assessment on Femara.  HPI:  The patient was last seen in the medical oncology clinic on 04/15/2018.  At that time, she had been on Femara for 1 month.  She denied any complaint.  Exam was stable.  Calcium was 10.2.  She received Prolia.  BMP on 05/16/2018 revealed a calcium of 10.0.  During the interim, patient is doing well overall. 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 Patient experiencing some minor night sweats. She denies fevers and any recent illnesses. Blood sugars are "a little better". Last A1c was 10.1. She is under a great deal of familial stress. She has joined a gym and is working out 3 times a week.   Patient denies that she has experienced any B symptoms. She denies any interval infections. Patient does not verbalize any concerns with regards to her breasts today. Patient performs monthly self breast examinations as recommended.   Patient advises that she maintains an adequate appetite. She is eating well. Weight today is 144 lb 7 oz (65.5 kg), which compared to her last visit to the clinic, represents a 2 pound increase.   Patient denies pain in the clinic today.   Past Medical History:  Diagnosis Date  . Breast cancer ([place])   . Breast cancer, right ([place]) 11/18/2017  . Diabetes mellitus without complication ([place])   . Heart murmur   . Hypertension   . Lichen sclerosus et atrophicus     Past Surgical History:  Procedure Laterality Date  . ABDOMINAL HYSTERECTOMY    . APPENDECTOMY    . BREAST BIOPSY Right 11/18/2017   INVASIVE MAMMARY CARCINOMA.   Marland Kitchen BREAST LUMPECTOMY Right 12/06/2017   invasive mammary carcinoma  . COLONOSCOPY    . COLONOSCOPY WITH PROPOFOL N/A 11/15/2017   Procedure: COLONOSCOPY WITH PROPOFOL;  Surgeon: Lollie Sails, MD;   Location: [facility];  Service: Endoscopy;  Laterality: N/A;  . PARTIAL MASTECTOMY WITH NEEDLE LOCALIZATION Right 12/06/2017   Procedure: PARTIAL MASTECTOMY WITH NEEDLE LOCALIZATION;  Surgeon: Herbert Pun, MD;  Location: [facility] OR[facility];  Service: General;  Laterality: Right;  . SENTINEL NODE BIOPSY Right 12/06/2017   Procedure: SENTINEL NODE BIOPSY;  Surgeon: Herbert Pun, MD;  Location: [facility] OR[facility];  Service: General;  Laterality: Right;  . TUBAL LIGATION      Family History  Problem Relation Age of Onset  . Breast cancer Mother 34  . Cancer Mother   . Cancer Maternal Aunt   . Cancer Maternal Grandmother     Social History:  reports that she has never smoked. She has never used smokeless tobacco. She reports that she does not drink alcohol or use drugs.  She is an only child.  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  She travels every other day to her home in [place] to take care of her animals, but is planning to move back to [facility] soon. Patient is retired from [employer]. The patient is alone today.  Allergies:  Allergies  Allergen Reactions  . Bactrim [Sulfamethoxazole-Trimethoprim] Other (See Comments)    Unknown  . Macrodantin [Nitrofurantoin Macrocrystal] Other (See Comments)    Unknown  . Sulfa Antibiotics Other (See Comments)    Flu like symptoms  Current Medications: Current Outpatient Medications  Medication Sig Dispense Refill  . acetaminophen (TYLENOL) 500 MG tablet Take 500 mg by mouth every 6 (six) hours as needed for moderate pain or headache.    Marland Kitchen aspirin EC 81 MG tablet Take 81 mg by mouth every evening.     . clobetasol ointment (TEMOVATE) 3.61 % Apply 1 application topically as needed.    . Continuous Blood Gluc Sensor (FREESTYLE LIBRE SENSOR SYSTEM) MISC Use 3 each every 10 (ten) days    . denosumab (PROLIA) 60 MG/ML SOSY injection Inject 60 mg into the skin every 6 (six) months.    .  empagliflozin (JARDIANCE) 10 MG TABS tablet Take 10 mg by mouth daily.    . fluocinonide ointment (LIDEX) 0.05 % Apply topically. Apply topically daily. Apply to areas of rash daily x3 weeks per month; avoid use on face and skin folds    . glimepiride (AMARYL) 4 MG tablet Take 4 mg by mouth daily with breakfast.    . ibuprofen (ADVIL,MOTRIN) 200 MG tablet Take 200 mg by mouth every 6 (six) hours as needed for headache or moderate pain.    Marland Kitchen letrozole (FEMARA) 2.5 MG tablet Take 1 tablet (2.5 mg total) by mouth daily. 90 tablet 3  . metFORMIN (GLUCOPHAGE) 1000 MG tablet Take 1,000 mg by mouth 2 (two) times daily with a meal.    . Multiple Vitamin (MULTIVITAMIN) tablet Take 1 tablet by mouth daily.    . Omega-3 Fatty Acids (FISH OIL) 1200 MG CAPS Take 2,400 mg by mouth 2 (two) times daily.    Marland Kitchen PREVIDENT 5000 BOOSTER PLUS 1.1 % PSTE   5  . quinapril (ACCUPRIL) 20 MG tablet Take 20 mg by mouth daily.     . rosuvastatin (CRESTOR) 10 MG tablet Take 10 mg by mouth daily.     No current facility-administered medications for this visit.     Review of Systems  Constitutional: Positive for diaphoresis (nocturnal; minor). Negative for fever, malaise/fatigue and weight loss (up 2 pounds).  HENT: Negative.   Eyes: Negative.   Respiratory: Negative for cough, hemoptysis, sputum production and shortness of breath.   Cardiovascular: Negative for chest pain, palpitations, orthopnea, leg swelling and PND.  Gastrointestinal: Negative for abdominal pain, blood in stool, constipation, diarrhea, melena, nausea and vomiting.  Genitourinary: Negative for dysuria, frequency, hematuria and urgency.  Musculoskeletal: Negative for back pain, falls, joint pain and myalgias.  Skin: Negative for itching and rash.  Neurological: Positive for sensory change (mininimal neuropathy to fingers on RIGHT hand). Negative for dizziness, tremors, weakness and headaches.  Endo/Heme/Allergies: Does not bruise/bleed easily.        Diabetes. Last A1c 10.1  Psychiatric/Behavioral: Negative for depression, memory loss and suicidal ideas. The patient is not nervous/anxious and does not have insomnia.        Increased familial stress.  All other systems reviewed and are negative.  Performance status (ECOG): 0 - Asymptomatic  Vital Signs BP (!) 147/85 (BP Location: Left Arm, Patient Position: Sitting)   Pulse 69   Temp (!) 96.8 F (36 C) (Tympanic)   Resp 18   Wt 144 lb 7 oz (65.5 kg)   BMI 28.68 kg/m   Physical Exam  Constitutional: She is oriented to person, place, and time and well-developed, well-nourished, and in no distress. No distress.  HENT:  Head: Normocephalic and atraumatic.  Mouth/Throat: Oropharynx is clear and moist and mucous membranes are normal. No oropharyngeal exudate.  Pearline Cables styled hair.  Eyes:  Pupils are equal, round, and reactive to light. Conjunctivae and EOM are normal. No scleral icterus.  Glasses.  Brown eyes.  Neck: Normal range of motion. Neck supple. No JVD present.  Cardiovascular: Normal rate, regular rhythm, normal heart sounds and intact distal pulses. Exam reveals no gallop and no friction rub.  No murmur heard. Pulmonary/Chest: Effort normal and breath sounds normal. No respiratory distress. She has no wheezes. She has no rales. Breasts are asymmetrical (post operative chnages right breast).  Abdominal: Soft. Bowel sounds are normal. She exhibits no distension and no mass. There is no abdominal tenderness. There is no rebound and no guarding.  Musculoskeletal: Normal range of motion. She exhibits no edema or tenderness.  Lymphadenopathy:    She has no cervical adenopathy.    She has no axillary adenopathy.       Right: No inguinal and no supraclavicular adenopathy present.       Left: No inguinal and no supraclavicular adenopathy present.  Neurological: She is alert and oriented to person, place, and time.  Skin: Skin is warm and dry. No rash noted. She is not diaphoretic. No  erythema.  Psychiatric: Mood, affect and judgment normal.  Nursing note and vitals reviewed.    Orders Only on 07/28/2018  Component Date Value Ref Range Status  . WBC 07/28/2018 7.8  4.0 - 10.5 K/uL Final  . RBC 07/28/2018 4.76  3.87 - 5.11 MIL/uL Final  . Hemoglobin 07/28/2018 14.5  12.0 - 15.0 g/dL Final  . HCT 07/28/2018 42.6  36.0 - 46.0 % Final  . MCV 07/28/2018 89.5  80.0 - 100.0 fL Final  . MCH 07/28/2018 30.5  26.0 - 34.0 pg Final  . MCHC 07/28/2018 34.0  30.0 - 36.0 g/dL Final  . RDW 07/28/2018 12.4  11.5 - 15.5 % Final  . Platelets 07/28/2018 197  150 - 400 K/uL Final  . nRBC 07/28/2018 0.0  0.0 - 0.2 % Final  . Neutrophils Relative % 07/28/2018 64  % Final  . Neutro Abs 07/28/2018 5.0  1.7 - 7.7 K/uL Final  . Lymphocytes Relative 07/28/2018 24  % Final  . Lymphs Abs 07/28/2018 1.8  0.7 - 4.0 K/uL Final  . Monocytes Relative 07/28/2018 8  % Final  . Monocytes Absolute 07/28/2018 0.6  0.1 - 1.0 K/uL Final  . Eosinophils Relative 07/28/2018 3  % Final  . Eosinophils Absolute 07/28/2018 0.2  0.0 - 0.5 K/uL Final  . Basophils Relative 07/28/2018 1  % Final  . Basophils Absolute 07/28/2018 0.0  0.0 - 0.1 K/uL Final  . Immature Granulocytes 07/28/2018 0  % Final  . Abs Immature Granulocytes 07/28/2018 0.03  0.00 - 0.07 K/uL Final   Performed at [facility], [address], [place], [place] [address]  . Sodium 07/28/2018 136  135 - 145 mmol/L Final  . Potassium 07/28/2018 4.8  3.5 - 5.1 mmol/L Final  . Chloride 07/28/2018 103  98 - 111 mmol/L Final  . CO2 07/28/2018 26  22 - 32 mmol/L Final  . Glucose, Bld 07/28/2018 188* 70 - 99 mg/dL Final  . BUN 07/28/2018 18  8 - 23 mg/dL Final  . Creatinine, Ser 07/28/2018 0.70  0.44 - 1.00 mg/dL Final  . Calcium 07/28/2018 10.0  8.9 - 10.3 mg/dL Final  . Total Protein 07/28/2018 7.3  6.5 - 8.1 g/dL Final  . Albumin 07/28/2018 4.0  3.5 - 5.0 g/dL Final  . AST 07/28/2018 17  15 - 41 U/L Final  .  ALT 07/28/2018 21  0 - 44 U/L  Final  . Alkaline Phosphatase 07/28/2018 47  38 - 126 U/L Final  . Total Bilirubin 07/28/2018 0.8  0.3 - 1.2 mg/dL Final  . GFR calc non Af Amer 07/28/2018 >60  >60 mL/min Final  . GFR calc Af Amer 07/28/2018 >60  >60 mL/min Final   Comment: (NOTE) The eGFR has been calculated using the CKD EPI equation. This calculation has not been validated in all clinical situations. eGFR's persistently <60 mL/min signify possible Chronic Kidney Disease.   Georgiann Hahn gap 07/28/2018 7  5 - 15 Final   Performed at [facility], [address] [place], [place] [address]    Assessment:  Semaya Vida is a 72 y.o. female with stage IA right breast cancer s/p lumpectomy on 12/06/2017.  Pathology revealed a 1.7 cm grade III invasive mammary carcinoma of no special type.  There were biopsy site changes and a clip present.  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  Caudal margin re-excision revealed ductal and lobular carcinoma in situ.  In situ carcinoma was < 0.5 mm from the margin.  Thermal artifact limited interpretation.  New skin margin revealed in situ carcinoma < 0.5 mm from the surgical margin.  There was lymphovascular invasion.  One sentinel lymph node was negative.  Tumor was ER + (> 90%), PR + (> 90%) and Her2/neu 2+ (FISH negative).  Pathologic stage was pT1cN0.  Oncotype DX revealed a recurrence score of 9 (low risk).  Distant recurrencce risk at 9 years with hormonal therapy was 3%.  Absolute benefit of chemotherapy was < 1%.  Diagnostic right mammogram and ultrasound on 11/11/2017 revealed a suspicious mass in the 9 o'clock position of the right breast.  Targeted ultrasound revealed a 1.5 x 2.2 x 1.9 cm mass with posterior acoustic shadowing 10 cm from the nipple.  The right axilla was negative for adenopathy.  She received radiation from 01/27/2018 - 03/03/2018.  She began Femara on 03/15/2018.  CA27.29 has been followed: 24.7 on 11/24/2017 and 22.5 on 03/15/2018.  Bone density on  12/09/2015 revealed osteopenia with a T-score of -1.3 in the left hip.  Bone density on 12/14/2017 revealed osteopenia with a T-score of -1.4 in the left femoral neck.  She began Prolia on 04/15/2018.  She has a family history of breast cancer x 4 (mother and 3 paternal aunts) and "female cancer" (maternal great grandmother).  [facility] genetic testing was negative for BRCA1/2.  Symptomatically, she is doing well overall. She denies any acute complaints. No B symptoms or breast concerns. 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 Exam is stable. WBC 7800 ([place] 5000). Glucose 188 mg/dL.   Plan: 1. Labs today:  CBC with diff, CMP. 2. Stage IA RIGHT breast cancer:  Doing well. No breast concerns.   Continues on endocrine therapy (letrozole) as prescribed.    Schedule mammogram for 11/11/2018. 3. Osteopenia  Continue Prolia every 6 months   Due 10/16/2018, however patient wishes to defer until next scheduled visit.  4. Hypercalcemia  Calcium 10.0 mg/dL  PTH normal 5. Hyperglycemia  Blood sugar elevated at 188 mg/dL today in clinic.   Last Hgb A1c was 10.1.  Under a great deal of stress. Trying to watch diet.   Encouraged to follow up with PCP regarding blood sugar control.  6. RTC in 3 months for MD assessment and labs (CBC with diff, CMP, CA27.29, Vit D), review of mammogram, Prolia injection.   Gaspar Bidding  Pearline Cables, NP 07/28/2018, 11:59 AM   I saw and evaluated the patient, participating in the key portions of the service and reviewing pertinent diagnostic studies and records.  I reviewed the nurse practitioner's note and agree with the findings and the plan.  The assessment and plan were discussed with the patient.  A few questions were asked by the patient and answered.   Nolon Stalls, MD 07/28/2018,11:59 AM

## 2018-07-28 NOTE — Progress Notes (Signed)
Patient offers no complaints regarding her breast cancer today.

## 2018-07-29 ENCOUNTER — Encounter: Payer: Self-pay | Admitting: Hematology and Oncology

## 2018-09-19 ENCOUNTER — Telehealth: Payer: Self-pay | Admitting: *Deleted

## 2018-09-19 NOTE — Telephone Encounter (Signed)
Called patient in regards to if she wanted her lab/MD/INJ appt scheduled for 10/31/2018 to be R/S to [place] or if she wanted to continue to be seen here at the [place] office. Message was left.

## 2018-09-30 ENCOUNTER — Ambulatory Visit: Payer: Medicare Other | Admitting: Radiation Oncology

## 2018-10-07 ENCOUNTER — Encounter: Payer: Self-pay | Admitting: Radiation Oncology

## 2018-10-07 ENCOUNTER — Other Ambulatory Visit: Payer: Self-pay

## 2018-10-07 ENCOUNTER — Ambulatory Visit
Admission: RE | Admit: 2018-10-07 | Discharge: 2018-10-07 | Disposition: A | Discharge status: Home / Self Care | Payer: Medicare Other | Source: Ambulatory Visit | Attending: Radiation Oncology | Admitting: Radiation Oncology

## 2018-10-07 VITALS — BP 134/81 | HR 63 | Temp 97.6°F | Resp 18 | Wt 141.5 lb

## 2018-10-07 DIAGNOSIS — Z79811 Long term (current) use of aromatase inhibitors: Secondary | ICD-10-CM | POA: Diagnosis not present

## 2018-10-07 DIAGNOSIS — Z170 Estrogen receptor positive status [ER+]: Secondary | ICD-10-CM | POA: Insufficient documentation

## 2018-10-07 DIAGNOSIS — C50411 Malignant neoplasm of upper-outer quadrant of right female breast: Secondary | ICD-10-CM | POA: Insufficient documentation

## 2018-10-07 DIAGNOSIS — Z923 Personal history of irradiation: Secondary | ICD-10-CM | POA: Insufficient documentation

## 2018-10-07 NOTE — Progress Notes (Signed)
Radiation Oncology Follow up Note  Name: Brianna Rogers   Date:   10/07/2018 MRN:  [id number] DOB: [birth year]    This 73 y.o. female presents to the clinic today for  Month follow-up status post whole breast radiation to her right breast for stage I ER/PR positive HER-2/neu negative invasive mammary carcinoma.  REFERRING PROVIDER: Sallee Lange, *  HPI: patient is a 73 year old female now out 6 months having completed whole breast radiation to her right breast for stage I ER/PR positive invasive mammary carcinoma seen today in routine follow-up she is doing well. She specifically denies breast tenderness cough or bone pain. She has had some unusual sensation in the breast consistent with postsurgical changes. She has a mammogram scheduled for the next several weeks..she is currently on Femara tolerate that well without side effect.  COMPLICATIONS OF TREATMENT: none  FOLLOW UP COMPLIANCE: keeps appointments   PHYSICAL EXAM:  BP 134/81   Pulse 63   Temp 97.6 F (36.4 C)   Resp 18   Wt 141 lb 8.6 oz (64.2 kg)   BMI 28.11 kg/m  Lungs are clear to A&P cardiac examination essentially unremarkable with regular rate and rhythm. No dominant mass or nodularity is noted in either breast in 2 positions examined. Incision is well-healed. No axillary or supraclavicular adenopathy is appreciated. Cosmetic result is excellent.Well-developed well-nourished patient in NAD. HEENT reveals PERLA, EOMI, discs not visualized.  Oral cavity is clear. No oral mucosal lesions are identified. Neck is clear without evidence of cervical or supraclavicular adenopathy. Lungs are clear to A&P. Cardiac examination is essentially unremarkable with regular rate and rhythm without murmur rub or thrill. Abdomen is benign with no organomegaly or masses noted. Motor sensory and DTR levels are equal and symmetric in the upper and lower extremities. Cranial nerves II through XII are grossly intact. Proprioception is  intact. No peripheral adenopathy or edema is identified. No motor or sensory levels are noted. Crude visual fields are within normal range.  RADIOLOGY RESULTS: no current films for review  PLAN:  Patient is doing well with no evidence of disease. I'm please were overall progress. I've asked to see her back in 6 months for follow-up. I've asked her to copy me on her next mammogram over the next several weeks. She continues on Femara without side effect. Patient is to call with any concerns.  I would like to take this opportunity to thank you for allowing me to participate in the care of your patient.Noreene Filbert, MD

## 2018-10-27 ENCOUNTER — Ambulatory Visit
Admission: RE | Admit: 2018-10-27 | Discharge: 2018-10-27 | Disposition: A | Discharge status: Home / Self Care | Payer: Medicare Other | Source: Ambulatory Visit | Attending: Urgent Care | Admitting: Urgent Care

## 2018-10-27 DIAGNOSIS — C50411 Malignant neoplasm of upper-outer quadrant of right female breast: Secondary | ICD-10-CM | POA: Diagnosis present

## 2018-10-30 NOTE — Progress Notes (Signed)
[facility] day:  10/31/2018   Chief Complaint: Brianna Rogers is a 73 y.o. female with stage IA right breast cancer and osteopenia who is seen for 3 month assessment on Femara.  HPI:  The patient was last seen in the medical oncology clinic on 07/28/2018.  At that time, she was doing well overall. She denied any acute complaints. No B symptoms or breast concerns. 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 Exam was stable. WBC was 7800 ([place] 5000). Glucose 188 mg/dL.   Bilateral mammogram on 10/27/2018 revealed no evidence of malignancy in either breast.  During the interim, patient is doing well overall.  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 Patient denies that she has experienced any B symptoms. She denies any interval infections.  Patient does not verbalize any concerns with regards to her breasts today. Patient performs monthly self breast examinations as recommended. She continues on letrozole as prescribed with minimal night sweats.   Patient complains of urinary incontinence. She is questioning age related symptom vs. medications (Jardiance, Femara). Patient has been taken off of her Vania Rea and has appreciated an improvement in symptoms.   Patient advises that she maintains an adequate appetite. She is eating well. Weight today is 145 lb 11.6 oz (66.1 kg), which compared to her last visit to the clinic, represents a 1 pound increase.   Patient denies pain in the clinic today.   Past Medical History:  Diagnosis Date  . Breast cancer ([place])   . Breast cancer, right ([place]) 11/18/2017  . Diabetes mellitus without complication ([place])   . Heart murmur   . Hypertension   . Lichen sclerosus et atrophicus     Past Surgical History:  Procedure Laterality Date  . ABDOMINAL HYSTERECTOMY    . APPENDECTOMY    . BREAST BIOPSY Right 11/18/2017   INVASIVE MAMMARY CARCINOMA.   Marland Kitchen BREAST LUMPECTOMY Right 12/06/2017   invasive  mammary carcinoma DCIS LCIS  with RAD  . COLONOSCOPY    . COLONOSCOPY WITH PROPOFOL N/A 11/15/2017   Procedure: COLONOSCOPY WITH PROPOFOL;  Surgeon: Lollie Sails, MD;  Location: [facility] ENDOSCOPY;  Service: Endoscopy;  Laterality: N/A;  . PARTIAL MASTECTOMY WITH NEEDLE LOCALIZATION Right 12/06/2017   Procedure: PARTIAL MASTECTOMY WITH NEEDLE LOCALIZATION;  Surgeon: Herbert Pun, MD;  Location: [facility] ORS;  Service: General;  Laterality: Right;  . SENTINEL NODE BIOPSY Right 12/06/2017   Procedure: SENTINEL NODE BIOPSY;  Surgeon: Herbert Pun, MD;  Location: [facility] ORS;  Service: General;  Laterality: Right;  . TUBAL LIGATION      Family History  Problem Relation Age of Onset  . Breast cancer Mother 58  . Cancer Mother   . Cancer Maternal Aunt   . Cancer Maternal Grandmother     Social History:  reports that she has never smoked. She has never used smokeless tobacco. She reports that she does not drink alcohol or use drugs.  She is an only child.  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  She travels every other day to her home in [place] to take care of her animals, but is planning to move back to [facility] soon. Patient is retired from [employer]. The patient is alone today.  Allergies:  Allergies  Allergen Reactions  . Bactrim [Sulfamethoxazole-Trimethoprim] Other (See Comments)    Unknown  . Macrodantin [Nitrofurantoin Macrocrystal] Other (See Comments)    Unknown  .  Sulfa Antibiotics Other (See Comments)    Flu like symptoms    Current Medications: Current Outpatient Medications  Medication Sig Dispense Refill  . acetaminophen (TYLENOL) 500 MG tablet Take 500 mg by mouth every 6 (six) hours as needed for moderate pain or headache.    Marland Kitchen aspirin EC 81 MG tablet Take 81 mg by mouth every evening.     . clobetasol ointment (TEMOVATE) 2.45 % Apply 1 application topically as needed.    . Continuous Blood Gluc Sensor  (FREESTYLE LIBRE SENSOR SYSTEM) MISC Use 3 each every 10 (ten) days    . denosumab (PROLIA) 60 MG/ML SOSY injection Inject 60 mg into the skin every 6 (six) months.    . fluocinonide ointment (LIDEX) 0.05 % Apply topically. Apply topically daily. Apply to areas of rash daily x3 weeks per month; avoid use on face and skin folds    . glimepiride (AMARYL) 4 MG tablet Take 4 mg by mouth daily with breakfast.    . LANTUS SOLOSTAR 100 UNIT/ML Solostar Pen ADM 16 UNI [place] ONCE D    . letrozole (FEMARA) 2.5 MG tablet Take 1 tablet (2.5 mg total) by mouth daily. 90 tablet 3  . metFORMIN (GLUCOPHAGE) 1000 MG tablet Take 1,000 mg by mouth 2 (two) times daily with a meal.    . Multiple Vitamin (MULTIVITAMIN) tablet Take 1 tablet by mouth daily.    . Omega-3 Fatty Acids (FISH OIL) 1200 MG CAPS Take 2,400 mg by mouth 2 (two) times daily.    Marland Kitchen PREVIDENT 5000 BOOSTER PLUS 1.1 % PSTE   5  . quinapril (ACCUPRIL) 20 MG tablet Take 20 mg by mouth daily.     . rosuvastatin (CRESTOR) 10 MG tablet Take 5 mg by mouth daily.     Marland Kitchen ibuprofen (ADVIL,MOTRIN) 200 MG tablet Take 200 mg by mouth every 6 (six) hours as needed for headache or moderate pain.     No current facility-administered medications for this visit.     Review of Systems  Constitutional: Positive for diaphoresis (nocturnal; minor). Negative for chills, fever, malaise/fatigue and weight loss (up 1 pound).       Feels fine.  HENT: Negative.  Negative for congestion, ear pain, nosebleeds, sinus pain and sore throat.   Eyes: Negative.  Negative for blurred vision, double vision and photophobia.  Respiratory: Negative.  Negative for cough, hemoptysis, sputum production and shortness of breath.   Cardiovascular: Negative.  Negative for chest pain, palpitations, orthopnea, leg swelling and PND.  Gastrointestinal: Negative.  Negative for abdominal pain, blood in stool, constipation, diarrhea, melena, nausea and vomiting.  Genitourinary: Negative for dysuria,  frequency, hematuria and urgency.       Urinary incontinence.  Musculoskeletal: Negative.  Negative for back pain, falls, joint pain and myalgias.  Skin: Negative.  Negative for itching and rash.  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 Negative for dizziness, tremors, weakness and headaches.  Endo/Heme/Allergies: Does not bruise/bleed easily.       Diabetes.  Psychiatric/Behavioral: Negative for depression and memory loss. The patient is not nervous/anxious and does not have insomnia.   All other systems reviewed and are negative.  Performance status (ECOG): 1  Vital Signs BP (!) 157/87 (BP Location: Left Arm, Patient Position: Sitting)   Pulse 67   Temp 97.9 F (36.6 C) (Oral)   Resp 16   Wt 145 lb 11.6 oz (66.1 kg)   SpO2 99%   BMI 28.94 kg/m  Physical Exam  Constitutional: She is oriented to person, place, and time and well-developed, well-nourished, and in no distress. No distress.  HENT:  Head: Normocephalic and atraumatic.  Mouth/Throat: Oropharynx is clear and moist and mucous membranes are normal. No oropharyngeal exudate.  Short styled gray hair.  Eyes: Pupils are equal, round, and reactive to light. Conjunctivae and EOM are normal. No scleral icterus.  Glasses.  Brown eyes.  Neck: Normal range of motion. Neck supple. No JVD present.  Cardiovascular: Normal rate, regular rhythm, normal heart sounds and intact distal pulses. Exam reveals no gallop and no friction rub.  No murmur heard. Pulmonary/Chest: Effort normal and breath sounds normal. No respiratory distress. She has no wheezes. She has no rales. Breasts are asymmetrical (post operative changes right breast with inferior edema).  Abdominal: Soft. Bowel sounds are normal. She exhibits no distension and no mass. There is no abdominal tenderness. There is no rebound and no guarding.  Musculoskeletal: Normal range of motion.        General: No tenderness or edema.   Lymphadenopathy:    She has no cervical adenopathy.    She has no axillary adenopathy.       Right: No inguinal and no supraclavicular adenopathy present.       Left: No inguinal and no supraclavicular adenopathy present.  Neurological: She is alert and oriented to person, place, and time.  Skin: Skin is warm and dry. No rash noted. She is not diaphoretic. No erythema. No pallor.  Psychiatric: Mood, affect and judgment normal.  Nursing note and vitals reviewed.    Appointment on 10/31/2018  Component Date Value Ref Range Status  . Sodium 10/31/2018 140  135 - 145 mmol/L Final  . Potassium 10/31/2018 4.0  3.5 - 5.1 mmol/L Final  . Chloride 10/31/2018 104  98 - 111 mmol/L Final  . CO2 10/31/2018 28  22 - 32 mmol/L Final  . Glucose, Bld 10/31/2018 233* 70 - 99 mg/dL Final  . BUN 10/31/2018 17  8 - 23 mg/dL Final  . Creatinine, Ser 10/31/2018 0.52  0.44 - 1.00 mg/dL Final  . Calcium 10/31/2018 9.7  8.9 - 10.3 mg/dL Final  . Total Protein 10/31/2018 7.0  6.5 - 8.1 g/dL Final  . Albumin 10/31/2018 3.9  3.5 - 5.0 g/dL Final  . AST 10/31/2018 15  15 - 41 U/L Final  . ALT 10/31/2018 19  0 - 44 U/L Final  . Alkaline Phosphatase 10/31/2018 47  38 - 126 U/L Final  . Total Bilirubin 10/31/2018 0.9  0.3 - 1.2 mg/dL Final  . GFR calc non Af Amer 10/31/2018 >60  >60 mL/min Final  . GFR calc Af Amer 10/31/2018 >60  >60 mL/min Final  . Anion gap 10/31/2018 8  5 - 15 Final   Performed at [facility], [address], [place], [place] [address]  . WBC 10/31/2018 7.5  4.0 - 10.5 K/uL Final  . RBC 10/31/2018 4.49  3.87 - 5.11 MIL/uL Final  . Hemoglobin 10/31/2018 13.8  12.0 - 15.0 g/dL Final  . HCT 10/31/2018 40.6  36.0 - 46.0 % Final  . MCV 10/31/2018 90.4  80.0 - 100.0 fL Final  . MCH 10/31/2018 30.7  26.0 - 34.0 pg Final  . MCHC 10/31/2018 34.0  30.0 - 36.0 g/dL Final  . RDW 10/31/2018 12.7  11.5 - 15.5 % Final  . Platelets 10/31/2018 181  150 - 400 K/uL Final  . nRBC 10/31/2018  0.0  0.0 -  0.2 % Final  . Neutrophils Relative % 10/31/2018 73  % Final  . Neutro Abs 10/31/2018 5.6  1.7 - 7.7 K/uL Final  . Lymphocytes Relative 10/31/2018 16  % Final  . Lymphs Abs 10/31/2018 1.2  0.7 - 4.0 K/uL Final  . Monocytes Relative 10/31/2018 7  % Final  . Monocytes Absolute 10/31/2018 0.5  0.1 - 1.0 K/uL Final  . Eosinophils Relative 10/31/2018 2  % Final  . Eosinophils Absolute 10/31/2018 0.2  0.0 - 0.5 K/uL Final  . Basophils Relative 10/31/2018 1  % Final  . Basophils Absolute 10/31/2018 0.0  0.0 - 0.1 K/uL Final  . Immature Granulocytes 10/31/2018 1  % Final  . Abs Immature Granulocytes 10/31/2018 0.05  0.00 - 0.07 K/uL Final   Performed at [facility], [address], [place], [place] [address]    Assessment:  Brianna Rogers is a 73 y.o. female with stage IA right breast cancer s/p lumpectomy on 12/06/2017.  Pathology revealed a 1.7 cm grade III invasive mammary carcinoma of no special type.  There were biopsy site changes and a clip present.  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  Caudal margin re-excision revealed ductal and lobular carcinoma in situ.  In situ carcinoma was < 0.5 mm from the margin.  Thermal artifact limited interpretation.  New skin margin revealed in situ carcinoma < 0.5 mm from the surgical margin.  There was lymphovascular invasion.  One sentinel lymph node was negative.  Tumor was ER + (> 90%), PR + (> 90%) and Her2/neu 2+ (FISH negative).  Pathologic stage was pT1cN0.  Oncotype DX revealed a recurrence score of 9 (low risk).  Distant recurrencce risk at 9 years with hormonal therapy was 3%.  Absolute benefit of chemotherapy was < 1%.  Diagnostic right mammogram and ultrasound on 11/11/2017 revealed a suspicious mass in the 9 o'clock position of the right breast.  Targeted ultrasound revealed a 1.5 x 2.2 x 1.9 cm mass with posterior acoustic shadowing 10 cm from the nipple.  The right axilla was negative for adenopathy.  Bilateral  mammogram on 10/27/2018 revealed no evidence of malignancy in either breast.  She received radiation from 01/27/2018 - 03/03/2018.  She began Femara on 03/15/2018.  CA27.29 has been followed: 24.7 on 11/24/2017, 22.5 on 03/15/2018, and 16.0 on 10/31/2018.  Bone density on 12/09/2015 revealed osteopenia with a T-score of -1.3 in the left hip.  Bone density on 12/14/2017 revealed osteopenia with a T-score of -1.4 in the left femoral neck.  She began Prolia on 04/15/2018.  She has a family history of breast cancer x 4 (mother and 3 paternal aunts) and "female cancer" (maternal great grandmother).  [facility] genetic testing was negative for BRCA1/2.  Symptomatically, she is doing well.  Exam is unremarkable.  CA27.29 is normal.  Plan: 1. Labs today:  CBC with diff, CMP, CA27.29, vitamin D. 2. Stage IA RIGHT breast cancer Clinically, she is doing well. Review interval mammogram- no evidence of disease. Continue Femara.  3. Osteopenia Prolia today. Continue Prolia every 6 months. 4. Hypercalcemia Calcium 9.7 (normal). PTH was normal on 03/15/2018. Continue to monitor. 5. Hyperglycemia Blood sugar 233 today in clinic.  Follow-up with Gaetano Net, NP 6. RTC in 3 months for MD assessment and labs (CBC with diff, CMP, CA27.29).    Honor Loh, NP 10/31/2018, 11:03 AM   I saw and evaluated the patient, participating in the key portions of the service and reviewing pertinent diagnostic studies  and records.  I reviewed the nurse practitioner's note and agree with the findings and the plan.  The assessment and plan were discussed with the patient.  A few questions were asked by the patient and answered.   Nolon Stalls, MD 10/31/2018,11:03 AM

## 2018-10-31 ENCOUNTER — Other Ambulatory Visit: Payer: Medicare Other

## 2018-10-31 ENCOUNTER — Inpatient Hospital Stay: Payer: Medicare Other

## 2018-10-31 ENCOUNTER — Ambulatory Visit: Payer: Medicare Other

## 2018-10-31 ENCOUNTER — Inpatient Hospital Stay: Payer: Medicare Other | Attending: Hematology and Oncology | Admitting: Hematology and Oncology

## 2018-10-31 ENCOUNTER — Inpatient Hospital Stay: Payer: Medicare Other | Attending: Hematology and Oncology

## 2018-10-31 ENCOUNTER — Ambulatory Visit: Payer: Medicare Other | Admitting: Hematology and Oncology

## 2018-10-31 ENCOUNTER — Encounter: Payer: Self-pay | Admitting: Hematology and Oncology

## 2018-10-31 VITALS — BP 157/87 | HR 67 | Temp 97.9°F | Resp 16 | Wt 145.7 lb

## 2018-10-31 DIAGNOSIS — Z79811 Long term (current) use of aromatase inhibitors: Secondary | ICD-10-CM

## 2018-10-31 DIAGNOSIS — Z170 Estrogen receptor positive status [ER+]: Secondary | ICD-10-CM | POA: Diagnosis not present

## 2018-10-31 DIAGNOSIS — C50411 Malignant neoplasm of upper-outer quadrant of right female breast: Secondary | ICD-10-CM

## 2018-10-31 DIAGNOSIS — M8580 Other specified disorders of bone density and structure, unspecified site: Secondary | ICD-10-CM | POA: Diagnosis present

## 2018-10-31 DIAGNOSIS — Z79899 Other long term (current) drug therapy: Secondary | ICD-10-CM | POA: Insufficient documentation

## 2018-10-31 DIAGNOSIS — Z794 Long term (current) use of insulin: Secondary | ICD-10-CM | POA: Insufficient documentation

## 2018-10-31 DIAGNOSIS — M85852 Other specified disorders of bone density and structure, left thigh: Secondary | ICD-10-CM | POA: Diagnosis not present

## 2018-10-31 DIAGNOSIS — G629 Polyneuropathy, unspecified: Secondary | ICD-10-CM | POA: Insufficient documentation

## 2018-10-31 DIAGNOSIS — Z791 Long term (current) use of non-steroidal anti-inflammatories (NSAID): Secondary | ICD-10-CM | POA: Insufficient documentation

## 2018-10-31 DIAGNOSIS — E119 Type 2 diabetes mellitus without complications: Secondary | ICD-10-CM | POA: Insufficient documentation

## 2018-10-31 DIAGNOSIS — C50911 Malignant neoplasm of unspecified site of right female breast: Secondary | ICD-10-CM | POA: Insufficient documentation

## 2018-10-31 DIAGNOSIS — I10 Essential (primary) hypertension: Secondary | ICD-10-CM | POA: Insufficient documentation

## 2018-10-31 DIAGNOSIS — Z7982 Long term (current) use of aspirin: Secondary | ICD-10-CM | POA: Insufficient documentation

## 2018-10-31 DIAGNOSIS — Z803 Family history of malignant neoplasm of breast: Secondary | ICD-10-CM | POA: Insufficient documentation

## 2018-10-31 LAB — COMPREHENSIVE METABOLIC PANEL
ALT: 19 U/L (ref 0–44)
AST: 15 U/L (ref 15–41)
Albumin: 3.9 g/dL (ref 3.5–5.0)
Alkaline Phosphatase: 47 U/L (ref 38–126)
Anion gap: 8 (ref 5–15)
BUN: 17 mg/dL (ref 8–23)
CO2: 28 mmol/L (ref 22–32)
Calcium: 9.7 mg/dL (ref 8.9–10.3)
Chloride: 104 mmol/L (ref 98–111)
Creatinine, Ser: 0.52 mg/dL (ref 0.44–1.00)
GFR calc Af Amer: >60 mL/min (ref >60)
GFR calc non Af Amer: >60 mL/min (ref >60)
Glucose, Bld: 233 mg/dL — ABNORMAL HIGH (ref 70–99)
Potassium: 4 mmol/L (ref 3.5–5.1)
Sodium: 140 mmol/L (ref 135–145)
Total Bilirubin: 0.9 mg/dL (ref 0.3–1.2)
Total Protein: 7 g/dL (ref 6.5–8.1)

## 2018-10-31 LAB — CBC WITH DIFFERENTIAL/PLATELET
Abs Immature Granulocytes: 0.05 10*3/uL (ref 0.00–0.07)
Basophils Absolute: 0 10*3/uL (ref 0.0–0.1)
Basophils Relative: 1 %
Eosinophils Absolute: 0.2 10*3/uL (ref 0.0–0.5)
Eosinophils Relative: 2 %
HCT: 40.6 % (ref 36.0–46.0)
Hemoglobin: 13.8 g/dL (ref 12.0–15.0)
Immature Granulocytes: 1 %
Lymphocytes Relative: 16 %
Lymphs Abs: 1.2 10*3/uL (ref 0.7–4.0)
MCH: 30.7 pg (ref 26.0–34.0)
MCHC: 34 g/dL (ref 30.0–36.0)
MCV: 90.4 fL (ref 80.0–100.0)
Monocytes Absolute: 0.5 10*3/uL (ref 0.1–1.0)
Monocytes Relative: 7 %
Neutro Abs: 5.6 10*3/uL (ref 1.7–7.7)
Neutrophils Relative %: 73 %
Platelets: 181 10*3/uL (ref 150–400)
RBC: 4.49 MIL/uL (ref 3.87–5.11)
RDW: 12.7 % (ref 11.5–15.5)
WBC: 7.5 10*3/uL (ref 4.0–10.5)
nRBC: 0 % (ref 0.0–0.2)

## 2018-10-31 MED ORDER — DENOSUMAB 60 MG/ML ~~LOC~~ SOSY
60.0000 mg | PREFILLED_SYRINGE | Freq: Once | SUBCUTANEOUS | Status: AC
  Administered 2018-10-31: 60 mg via SUBCUTANEOUS

## 2018-10-31 NOTE — Progress Notes (Signed)
Pt here for follow up.   Reports she is having Incontinence starting approx. 3 weeks ago. Jardiance was d.c from PCP as this was believed to have caused this. PCP advised patient to make Dr. Mike Gip aware d/t Letrozole possibly causing this per patient/PCP.   Pt has seen some improvement since Jardiance has been d/c.

## 2018-11-01 LAB — VITAMIN D 25 HYDROXY (VIT D DEFICIENCY, FRACTURES): Vit D, 25-Hydroxy: 33.3 ng/mL (ref 30.0–100.0)

## 2018-11-02 LAB — CANCER ANTIGEN 27.29: CA 27.29: 16 U/mL (ref 0.0–38.6)

## 2019-04-05 ENCOUNTER — Inpatient Hospital Stay: Payer: Medicare Other | Attending: Hematology and Oncology | Admitting: Hematology and Oncology

## 2019-04-05 ENCOUNTER — Inpatient Hospital Stay: Payer: Medicare Other

## 2019-04-05 ENCOUNTER — Other Ambulatory Visit: Payer: Self-pay

## 2019-04-05 VITALS — BP 156/91 | HR 65 | Temp 98.1°F | Resp 18 | Wt 153.9 lb

## 2019-04-05 DIAGNOSIS — C50911 Malignant neoplasm of unspecified site of right female breast: Secondary | ICD-10-CM | POA: Insufficient documentation

## 2019-04-05 DIAGNOSIS — Z79811 Long term (current) use of aromatase inhibitors: Secondary | ICD-10-CM | POA: Diagnosis not present

## 2019-04-05 DIAGNOSIS — G629 Polyneuropathy, unspecified: Secondary | ICD-10-CM | POA: Diagnosis not present

## 2019-04-05 DIAGNOSIS — Z170 Estrogen receptor positive status [ER+]: Secondary | ICD-10-CM | POA: Insufficient documentation

## 2019-04-05 DIAGNOSIS — M2550 Pain in unspecified joint: Secondary | ICD-10-CM | POA: Diagnosis not present

## 2019-04-05 DIAGNOSIS — M8580 Other specified disorders of bone density and structure, unspecified site: Secondary | ICD-10-CM | POA: Diagnosis not present

## 2019-04-05 DIAGNOSIS — C50411 Malignant neoplasm of upper-outer quadrant of right female breast: Secondary | ICD-10-CM

## 2019-04-05 DIAGNOSIS — M1990 Unspecified osteoarthritis, unspecified site: Secondary | ICD-10-CM

## 2019-04-05 DIAGNOSIS — M85852 Other specified disorders of bone density and structure, left thigh: Secondary | ICD-10-CM

## 2019-04-05 LAB — CBC WITH DIFFERENTIAL/PLATELET
Abs Immature Granulocytes: 0.03 10*3/uL (ref 0.00–0.07)
Basophils Absolute: 0 10*3/uL (ref 0.0–0.1)
Basophils Relative: 1 %
Eosinophils Absolute: 0.3 10*3/uL (ref 0.0–0.5)
Eosinophils Relative: 3 %
HCT: 39.9 % (ref 36.0–46.0)
Hemoglobin: 13.8 g/dL (ref 12.0–15.0)
Immature Granulocytes: 0 %
Lymphocytes Relative: 17 %
Lymphs Abs: 1.5 10*3/uL (ref 0.7–4.0)
MCH: 31 pg (ref 26.0–34.0)
MCHC: 34.6 g/dL (ref 30.0–36.0)
MCV: 89.7 fL (ref 80.0–100.0)
Monocytes Absolute: 0.5 10*3/uL (ref 0.1–1.0)
Monocytes Relative: 6 %
Neutro Abs: 6.5 10*3/uL (ref 1.7–7.7)
Neutrophils Relative %: 73 %
Platelets: 192 10*3/uL (ref 150–400)
RBC: 4.45 MIL/uL (ref 3.87–5.11)
RDW: 12.3 % (ref 11.5–15.5)
WBC: 8.9 10*3/uL (ref 4.0–10.5)
nRBC: 0 % (ref 0.0–0.2)

## 2019-04-05 LAB — COMPREHENSIVE METABOLIC PANEL
ALT: 19 U/L (ref 0–44)
AST: 14 U/L — ABNORMAL LOW (ref 15–41)
Albumin: 3.9 g/dL (ref 3.5–5.0)
Alkaline Phosphatase: 50 U/L (ref 38–126)
Anion gap: 9 (ref 5–15)
BUN: 27 mg/dL — ABNORMAL HIGH (ref 8–23)
CO2: 25 mmol/L (ref 22–32)
Calcium: 9.7 mg/dL (ref 8.9–10.3)
Chloride: 101 mmol/L (ref 98–111)
Creatinine, Ser: 0.69 mg/dL (ref 0.44–1.00)
GFR calc Af Amer: >60 mL/min (ref >60)
GFR calc non Af Amer: >60 mL/min (ref >60)
Glucose, Bld: 303 mg/dL — ABNORMAL HIGH (ref 70–99)
Potassium: 4.6 mmol/L (ref 3.5–5.1)
Sodium: 135 mmol/L (ref 135–145)
Total Bilirubin: 1.1 mg/dL (ref 0.3–1.2)
Total Protein: 7 g/dL (ref 6.5–8.1)

## 2019-04-05 NOTE — Progress Notes (Signed)
[facility]  [address] [place], [place] [address] Phone: [phone]  Fax: [phone]   Clinic Day:  04/05/2019  Referring physician: Sallee Lange, *  Chief Complaint: Brianna Rogers is a 73 y.o. female stage IA right breast cancer and osteopenia who is seen for 5 month assessment on Femara.  HPI: The patient was last seen in the medical oncology clinic on 10/31/2018. At that time, she was doing well.  Exam was unremarkable. CA27.29 was normal. She received Prolia.  She continued Femara.  She was seen by Dr. Peyton Najjar on 02/21/2019. There was no evidence of recurrence.   During the interim, she is doing "okay." She reports having a rough past 3 months as her mother passed away in 01/28/2023. She has had joint pain and discomfort in her hands, which are mildly swollen, and her right ankle and foot. She has been moving to [place] and attributes some of the joint pain to moving boxes and furniture. 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   She continues monthly breast exams. She notes discomfort in her right breast, worse when she moves her arm across her chest, and hardness in her breast that has improved.   She would prefer to continue on letrozole, as opposed to switching to an alternative aromatase inhibitor. She reports her father had rheumatoid arthritis and she would like to be tested for it, to see if it is the source of her joint pain.    Past Medical History:  Diagnosis Date  . Breast cancer ([place])   . Breast cancer, right ([facility]) 11/18/2017  . Diabetes mellitus without complication ([place])   . Heart murmur   . Hypertension   . Lichen sclerosus et atrophicus     Past Surgical History:  Procedure Laterality Date  . ABDOMINAL HYSTERECTOMY    . APPENDECTOMY    . BREAST BIOPSY Right 11/18/2017   INVASIVE MAMMARY CARCINOMA.   Marland Kitchen BREAST LUMPECTOMY Right 12/06/2017   invasive mammary carcinoma DCIS LCIS  with RAD  .  COLONOSCOPY    . COLONOSCOPY WITH PROPOFOL N/A 11/15/2017   Procedure: COLONOSCOPY WITH PROPOFOL;  Surgeon: Lollie Sails, MD;  Location: [facility] ENDOSCOPY;  Service: Endoscopy;  Laterality: N/A;  . PARTIAL MASTECTOMY WITH NEEDLE LOCALIZATION Right 12/06/2017   Procedure: PARTIAL MASTECTOMY WITH NEEDLE LOCALIZATION;  Surgeon: Herbert Pun, MD;  Location: [facility] ORS;  Service: General;  Laterality: Right;  . SENTINEL NODE BIOPSY Right 12/06/2017   Procedure: SENTINEL NODE BIOPSY;  Surgeon: Herbert Pun, MD;  Location: [facility] ORS;  Service: General;  Laterality: Right;  . TUBAL LIGATION      Family History  Problem Relation Age of Onset  . Breast cancer Mother 51  . Cancer Mother   . Cancer Maternal Aunt   . Cancer Maternal Grandmother     Social History:  reports that she has never smoked. She has never used smokeless tobacco. She reports that she does not drink alcohol or use drugs. She is an only child. Her mother passed away in 2019-01-28. She travels every other day to her home in [place] to take care of her animals, but is in the process of moving to [place] now. Patient is retired from [employer]. The patient is alone today.  Allergies:  Allergies  Allergen Reactions  . Alendronate Itching  . Bactrim [Sulfamethoxazole-Trimethoprim] Other (See Comments)    Unknown  . Macrodantin [Nitrofurantoin Macrocrystal] Other (See Comments)    Unknown  .  Sulfa Antibiotics Other (See Comments)    Flu like symptoms    Current Medications: Current Outpatient Medications  Medication Sig Dispense Refill  . acetaminophen (TYLENOL) 500 MG tablet Take 500 mg by mouth every 6 (six) hours as needed for moderate pain or headache.    Marland Kitchen aspirin EC 81 MG tablet Take 81 mg by mouth every evening.     . B-D ULTRAFINE III SHORT PEN 31G X 8 MM MISC U UTD    . clobetasol ointment (TEMOVATE) 2.70 % Apply 1 application topically as needed.    . Continuous Blood Gluc Sensor (FREESTYLE  LIBRE SENSOR SYSTEM) MISC Use 3 each every 10 (ten) days    . denosumab (PROLIA) 60 MG/ML SOSY injection Inject 60 mg into the skin every 6 (six) months.    . fluocinonide ointment (LIDEX) 0.05 % Apply topically. Apply topically daily. Apply to areas of rash daily x3 weeks per month; avoid use on face and skin folds    . glimepiride (AMARYL) 4 MG tablet Take 4 mg by mouth daily with breakfast. 4 MG AM, 2 MG PM    . ibuprofen (ADVIL,MOTRIN) 200 MG tablet Take 200 mg by mouth every 6 (six) hours as needed for headache or moderate pain.    Marland Kitchen imiquimod (ALDARA) 5 % cream Apply topically as needed.     Marland Kitchen LANTUS SOLOSTAR 100 UNIT/ML Solostar Pen ADM 16 UNI Oto ONCE D    . letrozole (FEMARA) 2.5 MG tablet Take 1 tablet (2.5 mg total) by mouth daily. 90 tablet 3  . metFORMIN (GLUCOPHAGE) 1000 MG tablet Take 1,000 mg by mouth 2 (two) times daily with a meal.    . Multiple Vitamin (MULTIVITAMIN) tablet Take 1 tablet by mouth daily.    . Omega-3 Fatty Acids (FISH OIL) 1200 MG CAPS Take 1,200 mg by mouth 2 (two) times daily.     Marland Kitchen PREVIDENT 5000 BOOSTER PLUS 1.1 % PSTE   5  . quinapril (ACCUPRIL) 20 MG tablet Take 20 mg by mouth daily.     . rosuvastatin (CRESTOR) 10 MG tablet Take 5 mg by mouth daily.      No current facility-administered medications for this visit.     Review of Systems  Constitutional: Positive for diaphoresis (nocturnal; minor). Negative for chills, fever, malaise/fatigue and weight loss (up 8 pounds).       Feels "okay."  HENT: Negative.  Negative for congestion, ear pain, hearing loss, nosebleeds, sinus pain and sore throat.   Eyes: Negative.  Negative for blurred vision, double vision and photophobia.  Respiratory: Negative.  Negative for cough, hemoptysis, sputum production and shortness of breath.   Cardiovascular: Negative.  Negative for chest pain, palpitations, orthopnea, leg swelling and PND.  Gastrointestinal: Negative.  Negative for abdominal pain, blood in stool,  constipation, diarrhea, melena, nausea and vomiting.  Genitourinary: Negative for dysuria, frequency, hematuria and urgency.  Musculoskeletal: Positive for joint pain (hands, ankles). Negative for back pain, falls and myalgias.       Right breast discomfort  Skin: Negative.  Negative for itching and rash.  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 Negative for dizziness, tremors, weakness and headaches.  Endo/Heme/Allergies: Does not bruise/bleed easily.       Diabetes.  Psychiatric/Behavioral: Negative for depression and memory loss. The patient is not nervous/anxious and does not have insomnia.   All other systems reviewed and are negative.  Performance status (ECOG): 1  Vitals Blood pressure (!) 156/91, pulse  65, temperature 98.1 F (36.7 C), temperature source Oral, resp. rate 18, weight 153 lb 14.1 oz (69.8 kg), SpO2 100 %.   Physical Exam  Constitutional: She is oriented to person, place, and time. She appears well-developed and well-nourished. No distress.  HENT:  Head: Normocephalic and atraumatic.  Mouth/Throat: Oropharynx is clear and moist. No oropharyngeal exudate.  Short styled gray hair. Mask.  Eyes: Pupils are equal, round, and reactive to light. Conjunctivae and EOM are normal. No scleral icterus.  Glasses.  Brown eyes.   Neck: Normal range of motion. Neck supple.  Cardiovascular: Normal rate, regular rhythm and normal heart sounds.  No murmur heard. Pulmonary/Chest: Effort normal and breath sounds normal. No respiratory distress. She has no wheezes. Right breast exhibits skin change (s/p lumpectomy, significant lateral scarring) and tenderness (edema s/p radiation). Right breast exhibits no inverted nipple, no mass and no nipple discharge. Left breast exhibits no inverted nipple, no mass, no nipple discharge, no skin change and no tenderness.  Abdominal: Soft. Bowel sounds are normal. She exhibits no distension. There is no  abdominal tenderness.  Musculoskeletal: Normal range of motion.        General: No edema.  Lymphadenopathy:    She has no cervical adenopathy.    She has no axillary adenopathy.       Right: No supraclavicular adenopathy present.       Left: No supraclavicular adenopathy present.  Neurological: She is alert and oriented to person, place, and time.  Skin: Skin is warm and dry. She is not diaphoretic. No erythema.  Psychiatric: She has a normal mood and affect. Her behavior is normal. Judgment and thought content normal.  Nursing note and vitals reviewed.    Appointment on 04/05/2019  Component Date Value Ref Range Status  . WBC 04/05/2019 8.9  4.0 - 10.5 K/uL Final  . RBC 04/05/2019 4.45  3.87 - 5.11 MIL/uL Final  . Hemoglobin 04/05/2019 13.8  12.0 - 15.0 g/dL Final  . HCT 04/05/2019 39.9  36.0 - 46.0 % Final  . MCV 04/05/2019 89.7  80.0 - 100.0 fL Final  . MCH 04/05/2019 31.0  26.0 - 34.0 pg Final  . MCHC 04/05/2019 34.6  30.0 - 36.0 g/dL Final  . RDW 04/05/2019 12.3  11.5 - 15.5 % Final  . Platelets 04/05/2019 192  150 - 400 K/uL Final  . nRBC 04/05/2019 0.0  0.0 - 0.2 % Final  . Neutrophils Relative % 04/05/2019 73  % Final  . Neutro Abs 04/05/2019 6.5  1.7 - 7.7 K/uL Final  . Lymphocytes Relative 04/05/2019 17  % Final  . Lymphs Abs 04/05/2019 1.5  0.7 - 4.0 K/uL Final  . Monocytes Relative 04/05/2019 6  % Final  . Monocytes Absolute 04/05/2019 0.5  0.1 - 1.0 K/uL Final  . Eosinophils Relative 04/05/2019 3  % Final  . Eosinophils Absolute 04/05/2019 0.3  0.0 - 0.5 K/uL Final  . Basophils Relative 04/05/2019 1  % Final  . Basophils Absolute 04/05/2019 0.0  0.0 - 0.1 K/uL Final  . Immature Granulocytes 04/05/2019 0  % Final  . Abs Immature Granulocytes 04/05/2019 0.03  0.00 - 0.07 K/uL Final   Performed at [facility], [address] [place], [place] [address]  . Sodium 04/05/2019 135  135 - 145 mmol/L Final  . Potassium 04/05/2019 4.6  3.5 - 5.1 mmol/L Final   . Chloride 04/05/2019 101  98 - 111 mmol/L Final  . CO2 04/05/2019 25  22 -  32 mmol/L Final  . Glucose, Bld 04/05/2019 303* 70 - 99 mg/dL Final  . BUN 04/05/2019 27* 8 - 23 mg/dL Final  . Creatinine, Ser 04/05/2019 0.69  0.44 - 1.00 mg/dL Final  . Calcium 04/05/2019 9.7  8.9 - 10.3 mg/dL Final  . Total Protein 04/05/2019 7.0  6.5 - 8.1 g/dL Final  . Albumin 04/05/2019 3.9  3.5 - 5.0 g/dL Final  . AST 04/05/2019 14* 15 - 41 U/L Final  . ALT 04/05/2019 19  0 - 44 U/L Final  . Alkaline Phosphatase 04/05/2019 50  38 - 126 U/L Final  . Total Bilirubin 04/05/2019 1.1  0.3 - 1.2 mg/dL Final  . GFR calc non Af Amer 04/05/2019 >60  >60 mL/min Final  . GFR calc Af Amer 04/05/2019 >60  >60 mL/min Final  . Anion gap 04/05/2019 9  5 - 15 Final   Performed at [facility], [address], [place], [place] [address]    Assessment:  Brianna Rogers is a 73 y.o. female with stage IA right breast cancer s/p lumpectomy on 12/06/2017.  Pathology revealed a 1.7 cm grade III invasive mammary carcinoma of no special type.  There were biopsy site changes and a clip present.  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  Caudal margin re-excision revealed ductal and lobular carcinoma in situ.  In situ carcinoma was < 0.5 mm from the margin.  Thermal artifact limited interpretation.  New skin margin revealed in situ carcinoma < 0.5 mm from the surgical margin.  There was lymphovascular invasion.  One sentinel lymph node was negative.  Tumor was ER + (> 90%), PR + (> 90%) and Her2/neu 2+ (FISH negative).  Pathologic stage was pT1cN0.  Oncotype DX revealed a recurrence score of 9 (low risk).  Distant recurrencce risk at 9 years with hormonal therapy was 3%.  Absolute benefit of chemotherapy was < 1%.  Diagnostic right mammogram and ultrasound on 11/11/2017 revealed a suspicious mass in the 9 o'clock position of the right breast.  Targeted ultrasound revealed a 1.5 x 2.2 x 1.9 cm mass with posterior  acoustic shadowing 10 cm from the nipple.  The right axilla was negative for adenopathy.  Bilateral mammogram on 10/27/2018 revealed no evidence of malignancy in either breast.  She received radiation from 01/27/2018 - 03/03/2018.  She began Femara on 03/15/2018.  CA27.29 has been followed: 24.7 on 11/24/2017, 22.5 on 03/15/2018, 16.0 on 10/31/2018, and 22.6 on 04/05/2019.  Bone density on 12/09/2015 revealed osteopenia with a T-score of -1.3 in the left hip.  Bone density on 12/14/2017 revealed osteopenia with a T-score of -1.4 in the left femoral neck.  She began Prolia on 04/15/2018 (last 10/31/2018).  She has a family history of breast cancer x 4 (mother and 3 paternal aunts) and "female cancer" (maternal great grandmother).  [facility] genetic testing was negative for BRCA1/2.  Symptomatically, she has joint pain and neuropathy in her fingertips.  Exam is stable.  CA27.29 is normal  Plan: 1.   Labs today:  CBC with diff, CMP, CA27.29. 2.   Stage IA RIGHT breast cancer Clinically she is doing well.  Exam reveals no evidence of recurrent disease.  Discuss continuation of Femara vs switch to Aromasin. Patient wishes to continue Femara at the current time. 3.   Osteopenia Continue Prolia every 6 months. Prolia next due on 05/01/2019. 4.   Joint pain Patient notes a family history of rheumatoid arthritis. Etiology may be secondary to Femara.  Check rheumatoid factor today. RN to call patient with results. 5.   RTC on 05/01/2019 for labs (BMP) and Prolia. 6.   RTC in 4 months for MD assessment and labs (CBC with diff, CMP, CA 27.29).  I discussed the assessment and treatment plan with the patient.  The patient was provided an opportunity to ask questions and all were answered.  The patient agreed with the plan and demonstrated an understanding of the instructions.  The patient was advised to call back if the symptoms worsen or if the condition fails to improve as anticipated.  I provided  15 minutes of face-to-face time during this this encounter and > 50% was spent counseling as documented under my assessment and plan.    Lequita Asal, MD, PhD    04/05/2019, 9:08 AM  I, Molly Dorshimer, am acting as Education administrator for [employer]. Mike Gip, MD, PhD.  I, Melissa C. Mike Gip, MD, have reviewed the above documentation for accuracy and completeness, and I agree with the above.

## 2019-04-05 NOTE — Progress Notes (Signed)
PT here for follow up. C/O bilateral hand joint pain and right ankle pain. Patient also reports she had discomfort in right breat, seems to be getting better now. Denies any further concerns.

## 2019-04-06 LAB — CANCER ANTIGEN 27.29: CA 27.29: 22.6 U/mL (ref 0.0–38.6)

## 2019-04-06 LAB — RHEUMATOID FACTOR: Rheumatoid fact SerPl-aCnc: <10 IU/mL (ref 0.0–13.9)

## 2019-04-12 ENCOUNTER — Encounter: Payer: Self-pay | Admitting: Hematology and Oncology

## 2019-04-13 ENCOUNTER — Other Ambulatory Visit: Payer: Self-pay | Admitting: Hematology and Oncology

## 2019-04-13 MED ORDER — EXEMESTANE 25 MG PO TABS: 25.0000 mg | ORAL_TABLET | Freq: Every day | ORAL | 1 refills | Status: DC

## 2019-04-20 ENCOUNTER — Other Ambulatory Visit: Payer: Self-pay

## 2019-04-21 ENCOUNTER — Encounter: Payer: Self-pay | Admitting: Radiation Oncology

## 2019-04-21 ENCOUNTER — Other Ambulatory Visit: Payer: Self-pay

## 2019-04-21 ENCOUNTER — Ambulatory Visit
Admission: RE | Admit: 2019-04-21 | Discharge: 2019-04-21 | Disposition: A | Discharge status: Home / Self Care | Payer: Medicare Other | Source: Ambulatory Visit | Attending: Radiation Oncology | Admitting: Radiation Oncology

## 2019-04-21 ENCOUNTER — Encounter: Payer: Self-pay | Admitting: Hematology and Oncology

## 2019-04-21 VITALS — BP 165/86 | HR 78 | Temp 98.0°F | Resp 18 | Wt 155.4 lb

## 2019-04-21 DIAGNOSIS — Z170 Estrogen receptor positive status [ER+]: Secondary | ICD-10-CM | POA: Insufficient documentation

## 2019-04-21 DIAGNOSIS — Z79811 Long term (current) use of aromatase inhibitors: Secondary | ICD-10-CM | POA: Diagnosis not present

## 2019-04-21 DIAGNOSIS — C50411 Malignant neoplasm of upper-outer quadrant of right female breast: Secondary | ICD-10-CM | POA: Diagnosis not present

## 2019-04-21 DIAGNOSIS — Z923 Personal history of irradiation: Secondary | ICD-10-CM | POA: Diagnosis not present

## 2019-04-21 NOTE — Progress Notes (Signed)
Radiation Oncology Follow up Note  Name: Brianna Rogers   Date:   04/21/2019 MRN:  [id number] DOB: Dec 28, 1945    This 73 y.o. female presents to the clinic today for 1 year follow-up status post whole breast radiation to her right breast for stage I ER PR positive HER-2 negative invasive mammary carcinoma.Marland Kitchen  REFERRING PROVIDER: Sallee Lange, *  HPI: Patient is a 73 year old female now about 1 year having completed whole breast radiation to her right breast for stage I ER PR positive invasive mammary carcinoma.  Seen today in routine follow-up she is doing well.  She specifically denies breast tenderness cough or bone pain..  Patient is recently been switched to Aromasin based on side effect profile which she is tolerating well.  Her last mammogram which I have reviewed was in February was BI-RADS 2 benign.  COMPLICATIONS OF TREATMENT: none  FOLLOW UP COMPLIANCE: keeps appointments   PHYSICAL EXAM:  BP (!) 165/86 (BP Location: Left Arm, Patient Position: Sitting)   Pulse 78   Temp 98 F (36.7 C) (Tympanic)   Resp 18   Wt 155 lb 6.4 oz (70.5 kg)   BMI 30.86 kg/m  Lungs are clear to A&P cardiac examination essentially unremarkable with regular rate and rhythm. No dominant mass or nodularity is noted in either breast in 2 positions examined. Incision is well-healed. No axillary or supraclavicular adenopathy is appreciated. Cosmetic result is excellent.  Well-developed well-nourished patient in NAD. HEENT reveals PERLA, EOMI, discs not visualized.  Oral cavity is clear. No oral mucosal lesions are identified. Neck is clear without evidence of cervical or supraclavicular adenopathy. Lungs are clear to A&P. Cardiac examination is essentially unremarkable with regular rate and rhythm without murmur rub or thrill. Abdomen is benign with no organomegaly or masses noted. Motor sensory and DTR levels are equal and symmetric in the upper and lower extremities. Cranial nerves II through XII  are grossly intact. Proprioception is intact. No peripheral adenopathy or edema is identified. No motor or sensory levels are noted. Crude visual fields are within normal range.  RADIOLOGY RESULTS: Mammograms reviewed compatible with above-stated findings  PLAN: Present time patient continues to do well 1 year out with no evidence of disease.  She continues on Aromasin without side effect.  I have asked to see her back in 1 year for follow-up.  Patient knows to call with any concerns.  I would like to take this opportunity to thank you for allowing me to participate in the care of your patient.Noreene Filbert, MD

## 2019-04-28 ENCOUNTER — Other Ambulatory Visit: Payer: Self-pay

## 2019-05-01 ENCOUNTER — Other Ambulatory Visit: Payer: Self-pay

## 2019-05-01 ENCOUNTER — Inpatient Hospital Stay: Payer: Medicare Other

## 2019-05-01 ENCOUNTER — Inpatient Hospital Stay: Payer: Medicare Other | Attending: Hematology and Oncology

## 2019-05-01 VITALS — BP 143/89 | HR 60 | Temp 97.9°F | Resp 18

## 2019-05-01 DIAGNOSIS — M85852 Other specified disorders of bone density and structure, left thigh: Secondary | ICD-10-CM

## 2019-05-01 DIAGNOSIS — C50911 Malignant neoplasm of unspecified site of right female breast: Secondary | ICD-10-CM | POA: Diagnosis present

## 2019-05-01 DIAGNOSIS — Z170 Estrogen receptor positive status [ER+]: Secondary | ICD-10-CM

## 2019-05-01 DIAGNOSIS — M8580 Other specified disorders of bone density and structure, unspecified site: Secondary | ICD-10-CM | POA: Insufficient documentation

## 2019-05-01 DIAGNOSIS — M1990 Unspecified osteoarthritis, unspecified site: Secondary | ICD-10-CM

## 2019-05-01 LAB — BASIC METABOLIC PANEL
Anion gap: 8 (ref 5–15)
BUN: 20 mg/dL (ref 8–23)
CO2: 26 mmol/L (ref 22–32)
Calcium: 9.3 mg/dL (ref 8.9–10.3)
Chloride: 101 mmol/L (ref 98–111)
Creatinine, Ser: 0.77 mg/dL (ref 0.44–1.00)
GFR calc Af Amer: >60 mL/min (ref >60)
GFR calc non Af Amer: >60 mL/min (ref >60)
Glucose, Bld: 296 mg/dL — ABNORMAL HIGH (ref 70–99)
Potassium: 4.2 mmol/L (ref 3.5–5.1)
Sodium: 135 mmol/L (ref 135–145)

## 2019-05-01 MED ORDER — DENOSUMAB 60 MG/ML ~~LOC~~ SOSY
60.0000 mg | PREFILLED_SYRINGE | Freq: Once | SUBCUTANEOUS | Status: AC
  Administered 2019-05-01: 60 mg via SUBCUTANEOUS
  Filled 2019-05-01: qty 1

## 2019-05-01 NOTE — Patient Instructions (Signed)
Denosumab injection What is this medicine? DENOSUMAB (den oh sue mab) slows bone breakdown. Prolia is used to treat osteoporosis in women after menopause and in men, and in people who are taking corticosteroids for 6 months or more. Xgeva is used to treat a high calcium level due to cancer and to prevent bone fractures and other bone problems caused by multiple myeloma or cancer bone metastases. Xgeva is also used to treat giant cell tumor of the bone. This medicine may be used for other purposes; ask your health care provider or pharmacist if you have questions. COMMON BRAND NAME(S): Prolia, XGEVA What should I tell my health care provider before I take this medicine? They need to know if you have any of these conditions:  dental disease  having surgery or tooth extraction  infection  kidney disease  low levels of calcium or Vitamin D in the blood  malnutrition  on hemodialysis  skin conditions or sensitivity  thyroid or parathyroid disease  an unusual reaction to denosumab, other medicines, foods, dyes, or preservatives  pregnant or trying to get pregnant  breast-feeding How should I use this medicine? This medicine is for injection under the skin. It is given by a health care professional in a hospital or clinic setting. A special MedGuide will be given to you before each treatment. Be sure to read this information carefully each time. For Prolia, talk to your pediatrician regarding the use of this medicine in children. Special care may be needed. For Xgeva, talk to your pediatrician regarding the use of this medicine in children. While this drug may be prescribed for children as young as 13 years for selected conditions, precautions do apply. Overdosage: If you think you have taken too much of this medicine contact a poison control center or emergency room at once. NOTE: This medicine is only for you. Do not share this medicine with others. What if I miss a dose? It is  important not to miss your dose. Call your doctor or health care professional if you are unable to keep an appointment. What may interact with this medicine? Do not take this medicine with any of the following medications:  other medicines containing denosumab This medicine may also interact with the following medications:  medicines that lower your chance of fighting infection  steroid medicines like prednisone or cortisone This list may not describe all possible interactions. Give your health care provider a list of all the medicines, herbs, non-prescription drugs, or dietary supplements you use. Also tell them if you smoke, drink alcohol, or use illegal drugs. Some items may interact with your medicine. What should I watch for while using this medicine? Visit your doctor or health care professional for regular checks on your progress. Your doctor or health care professional may order blood tests and other tests to see how you are doing. Call your doctor or health care professional for advice if you get a fever, chills or sore throat, or other symptoms of a cold or flu. Do not treat yourself. This drug may decrease your body's ability to fight infection. Try to avoid being around people who are sick. You should make sure you get enough calcium and vitamin D while you are taking this medicine, unless your doctor tells you not to. Discuss the foods you eat and the vitamins you take with your health care professional. See your dentist regularly. Brush and floss your teeth as directed. Before you have any dental work done, tell your dentist you are   receiving this medicine. Do not become pregnant while taking this medicine or for 5 months after stopping it. Talk with your doctor or health care professional about your birth control options while taking this medicine. Women should inform their doctor if they wish to become pregnant or think they might be pregnant. There is a potential for serious side  effects to an unborn child. Talk to your health care professional or pharmacist for more information. What side effects may I notice from receiving this medicine? Side effects that you should report to your doctor or health care professional as soon as possible:  allergic reactions like skin rash, itching or hives, swelling of the face, lips, or tongue  bone pain  breathing problems  dizziness  jaw pain, especially after dental work  redness, blistering, peeling of the skin  signs and symptoms of infection like fever or chills; cough; sore throat; pain or trouble passing urine  signs of low calcium like fast heartbeat, muscle cramps or muscle pain; pain, tingling, numbness in the hands or feet; seizures  unusual bleeding or bruising  unusually weak or tired Side effects that usually do not require medical attention (report to your doctor or health care professional if they continue or are bothersome):  constipation  diarrhea  headache  joint pain  loss of appetite  muscle pain  runny nose  tiredness  upset stomach This list may not describe all possible side effects. Call your doctor for medical advice about side effects. You may report side effects to FDA at 1-800-FDA-1088. Where should I keep my medicine? This medicine is only given in a clinic, doctor's office, or other health care setting and will not be stored at home. NOTE: This sheet is a summary. It may not cover all possible information. If you have questions about this medicine, talk to your doctor, pharmacist, or health care provider.  2020 Elsevier/Gold Standard (2018-01-14 16:10:44)

## 2019-05-08 ENCOUNTER — Telehealth: Payer: Self-pay

## 2019-05-08 NOTE — Telephone Encounter (Signed)
-----   Message from Lequita Asal, MD sent at 05/08/2019  3:06 PM EDT ----- Regarding: Please call patient  At last meeting, she wanted to continue Femara.  I just received an Rx for Aromasin 90 day supply.  What medication should we refill?  M

## 2019-05-08 NOTE — Telephone Encounter (Signed)
The patient states she has been taking the Aromasin for almost 2 weeks and she feel it is working better and would like for Dr C to refill Aromasin.  The patient was understanding and agreeable to continue with Aromasin.

## 2019-06-09 ENCOUNTER — Other Ambulatory Visit: Payer: Self-pay | Admitting: Hematology and Oncology

## 2019-06-09 ENCOUNTER — Telehealth: Payer: Self-pay

## 2019-06-09 MED ORDER — LETROZOLE 2.5 MG PO TABS: 2.5000 mg | ORAL_TABLET | Freq: Every day | ORAL | 0 refills | Status: DC

## 2019-06-09 NOTE — Telephone Encounter (Signed)
Spoke with the patient to see if she would like to stay on the Femara or change to the aromasin. The patient agreed to stay on the [facility]. I have sent a message to dr Mike Gip so she can get the patient medication refilled.

## 2019-06-12 ENCOUNTER — Other Ambulatory Visit: Payer: Self-pay | Admitting: Hematology and Oncology

## 2019-06-12 ENCOUNTER — Telehealth: Payer: Self-pay

## 2019-06-12 NOTE — Telephone Encounter (Signed)
Spoke with the patient to inform her that both the femara and Aromasin went to the pharmacy. The patient states she only picked up the Femara and had the Aromasin de-activated at that time and she will start The Femara on 06/13/2019. The patient was understanding and agreeable.

## 2019-07-30 NOTE — Progress Notes (Signed)
[facility]  [address] [place], [place] [address] Phone: [phone]  Fax: [phone]   Clinic Day:  08/01/2019  Referring physician: Sallee Lange, *  Chief Complaint: Brianna Rogers is a 73 y.o. female with stage IA right breast cancer and osteopenia who is seen for 4 month assessment.   HPI: The patient was last seen in the medical oncology clinic on 04/05/2019. 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 Exam was stable. CBC was normal. RF was negative.  CA27.29 was normal. Patient wished to continue Femara rather than switch to Aromasin.   She had a 1 year follow up s/p whole breast radiation with Dr. Baruch Gouty on 04/21/2019. Patient continued to do well 1 year out with no evidence of disease. She continued on Aromasin without side effect. She will follow up in 1 year.   She received Prolia on 05/01/2019.  She switched to Aromasin on 06/09/2019.  During the interim, she has been feeling "good". She has joint pain in her hands. Her hands are stiff in the morning. 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 She notes muscle pain (4/10). Her upper chest and arms are sore from moving boxes into her new house. She has been taking Femara for 1 month. She is not taking calcium. We discussed taking calcium and vitamin D.    Past Medical History:  Diagnosis Date  . Breast cancer ([place])   . Breast cancer, right ([place]) 11/18/2017  . Diabetes mellitus without complication ([place])   . Heart murmur   . Hypertension   . Lichen sclerosus et atrophicus     Past Surgical History:  Procedure Laterality Date  . ABDOMINAL HYSTERECTOMY    . APPENDECTOMY    . BREAST BIOPSY Right 11/18/2017   INVASIVE MAMMARY CARCINOMA.   Marland Kitchen BREAST LUMPECTOMY Right 12/06/2017   invasive mammary carcinoma DCIS LCIS  with RAD  . COLONOSCOPY    . COLONOSCOPY WITH PROPOFOL N/A 11/15/2017   Procedure: COLONOSCOPY WITH PROPOFOL;  Surgeon: Lollie Sails, MD;   Location: [facility] ENDOSCOPY;  Service: Endoscopy;  Laterality: N/A;  . PARTIAL MASTECTOMY WITH NEEDLE LOCALIZATION Right 12/06/2017   Procedure: PARTIAL MASTECTOMY WITH NEEDLE LOCALIZATION;  Surgeon: Herbert Pun, MD;  Location: [facility] ORS;  Service: General;  Laterality: Right;  . SENTINEL NODE BIOPSY Right 12/06/2017   Procedure: SENTINEL NODE BIOPSY;  Surgeon: Herbert Pun, MD;  Location: [facility] ORS;  Service: General;  Laterality: Right;  . TUBAL LIGATION      Family History  Problem Relation Age of Onset  . Breast cancer Mother 53  . Cancer Mother   . Cancer Maternal Aunt   . Cancer Maternal Grandmother     Social History:  reports that she has never smoked. She has never used smokeless tobacco. She reports that she does not drink alcohol or use drugs.  She is an only child. Her mother passed away in Feb 10, 2019. She no longer has to travel to [facility] to take care of her animals. She lives in [place]. Patient is retired from [employer]. The patient is alone today.  Allergies:  Allergies  Allergen Reactions  . Alendronate Itching  . Bactrim [Sulfamethoxazole-Trimethoprim] Other (See Comments)    Unknown  . Macrodantin [Nitrofurantoin Macrocrystal] Other (See Comments)    Unknown  . Sulfa Antibiotics Other (See Comments)    Flu like symptoms    Current Medications: Current Outpatient Medications  Medication Sig Dispense Refill  . acetaminophen (TYLENOL) 500  MG tablet Take 500 mg by mouth every 6 (six) hours as needed for moderate pain or headache.    Marland Kitchen aspirin EC 81 MG tablet Take 81 mg by mouth every evening.     . B-D ULTRAFINE III SHORT PEN 31G X 8 MM MISC U UTD    . clobetasol ointment (TEMOVATE) 6.22 % Apply 1 application topically as needed.    . Continuous Blood Gluc Sensor (FREESTYLE LIBRE SENSOR SYSTEM) MISC Use 3 each every 10 (ten) days    . denosumab (PROLIA) 60 MG/ML SOSY injection Inject 60 mg into the skin every 6 (six) months.    . fluocinonide  ointment (LIDEX) 0.05 % Apply topically. Apply topically daily. Apply to areas of rash daily x3 weeks per month; avoid use on face and skin folds    . glimepiride (AMARYL) 4 MG tablet Take 4 mg by mouth daily with breakfast. 4 MG AM, 2 MG PM    . ibuprofen (ADVIL,MOTRIN) 200 MG tablet Take 200 mg by mouth every 6 (six) hours as needed for headache or moderate pain.    Marland Kitchen imiquimod (ALDARA) 5 % cream Apply topically as needed.     Marland Kitchen LANTUS SOLOSTAR 100 UNIT/ML Solostar Pen 20 Units.     Marland Kitchen letrozole (FEMARA) 2.5 MG tablet Take 1 tablet (2.5 mg total) by mouth daily. 30 tablet 0  . metFORMIN (GLUCOPHAGE) 1000 MG tablet Take 1,000 mg by mouth 2 (two) times daily with a meal.    . Multiple Vitamin (MULTIVITAMIN) tablet Take 1 tablet by mouth daily.    . Omega-3 Fatty Acids (FISH OIL) 1200 MG CAPS Take 1,200 mg by mouth 2 (two) times daily.     Marland Kitchen PREVIDENT 5000 DRY MOUTH 1.1 % GEL dental gel BRUSH WITH PASTE 2 TO 3 TIMES D. DO NOT RINSE.    Marland Kitchen quinapril (ACCUPRIL) 20 MG tablet Take 20 mg by mouth daily.     . rosuvastatin (CRESTOR) 10 MG tablet Take 5 mg by mouth daily.     Marland Kitchen exemestane (AROMASIN) 25 MG tablet TAKE 1 TABLET BY MOUTH EVERY DAY AFTER BREAKFAST (Patient not taking: Reported on 07/31/2019) 90 tablet 1   No current facility-administered medications for this visit.     Review of Systems  Constitutional: Positive for weight loss (1 lb). Negative for chills, diaphoresis, fever and malaise/fatigue.       Feels "good".  HENT: Negative.  Negative for congestion, ear pain, hearing loss, nosebleeds, sinus pain and sore throat.   Eyes: Negative for blurred vision, double vision and photophobia.  Respiratory: Negative.  Negative for cough, hemoptysis, sputum production and shortness of breath.   Cardiovascular: Negative.  Negative for chest pain, palpitations, orthopnea, leg swelling and PND.  Gastrointestinal: Negative.  Negative for abdominal pain, blood in stool, constipation, diarrhea, melena,  nausea and vomiting.  Genitourinary: Negative.  Negative for dysuria, frequency, hematuria and urgency.  Musculoskeletal: Positive for joint pain (hands, ankles) and myalgias (4/10) secondary to picking up boxes in move. Negative for back pain and falls.  Skin: Negative.  Negative for itching and rash.  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 Negative for dizziness, tremors, speech change, focal weakness, weakness and headaches.  Endo/Heme/Allergies: Does not bruise/bleed easily.       Diabetes.  Psychiatric/Behavioral: Negative.  Negative for depression and memory loss. The patient is not nervous/anxious and does not have insomnia.   All other systems reviewed and are negative.  Performance status (ECOG):  1  Vitals Blood pressure (!) 165/90, pulse 61, temperature (!) 97.4 F (36.3 C), temperature source Tympanic, resp. rate 18, height '4\' 11"'  (1.499 m), weight 152 lb 1.9 oz (69 kg), SpO2 100 %.  Physical Exam  Constitutional: She is oriented to person, place, and time. She appears well-developed and well-nourished. No distress.  HENT:  Head: Normocephalic and atraumatic.  Mouth/Throat: Oropharynx is clear and moist. No oropharyngeal exudate.  Short styled gray hair.  Mask.  Eyes: Pupils are equal, round, and reactive to light. Conjunctivae and EOM are normal. No scleral icterus.  Glasses.  Brown eyes.   Neck: Normal range of motion. Neck supple. No JVD present.  Cardiovascular: Normal rate, regular rhythm and normal heart sounds.  No murmur heard. Pulmonary/Chest: Effort normal and breath sounds normal. No respiratory distress. She has no wheezes. She has no rales. She exhibits no tenderness. Right breast exhibits no inverted nipple, no mass, no nipple discharge, no skin change (s/p lumpectomy, significant lateral scarring) and no tenderness (inferior edema s/p radiation). Left breast exhibits no inverted nipple, no mass, no nipple discharge, no  skin change and no tenderness.  Abdominal: Soft. Bowel sounds are normal. She exhibits no distension and no mass. There is no abdominal tenderness. There is no rebound and no guarding.  Musculoskeletal: Normal range of motion.        General: No edema.  Lymphadenopathy:       Head (right side): No preauricular, no posterior auricular and no occipital adenopathy present.       Head (left side): No preauricular, no posterior auricular and no occipital adenopathy present.    She has no cervical adenopathy.    She has no axillary adenopathy.       Right: No inguinal and no supraclavicular adenopathy present.       Left: No inguinal and no supraclavicular adenopathy present.  Neurological: She is alert and oriented to person, place, and time.  Skin: Skin is warm and dry. No rash noted. She is not diaphoretic. No erythema. No pallor.  Psychiatric: She has a normal mood and affect. Her behavior is normal. Judgment and thought content normal.  Nursing note and vitals reviewed.   Appointment on 08/01/2019  Component Date Value Ref Range Status  . Sodium 08/01/2019 137  135 - 145 mmol/L Final  . Potassium 08/01/2019 4.6  3.5 - 5.1 mmol/L Final  . Chloride 08/01/2019 102  98 - 111 mmol/L Final  . CO2 08/01/2019 28  22 - 32 mmol/L Final  . Glucose, Bld 08/01/2019 236* 70 - 99 mg/dL Final  . BUN 08/01/2019 21  8 - 23 mg/dL Final  . Creatinine, Ser 08/01/2019 0.78  0.44 - 1.00 mg/dL Final  . Calcium 08/01/2019 9.9  8.9 - 10.3 mg/dL Final  . Total Protein 08/01/2019 7.0  6.5 - 8.1 g/dL Final  . Albumin 08/01/2019 3.8  3.5 - 5.0 g/dL Final  . AST 08/01/2019 15  15 - 41 U/L Final  . ALT 08/01/2019 18  0 - 44 U/L Final  . Alkaline Phosphatase 08/01/2019 43  38 - 126 U/L Final  . Total Bilirubin 08/01/2019 0.7  0.3 - 1.2 mg/dL Final  . GFR calc non Af Amer 08/01/2019 >60  >60 mL/min Final  . GFR calc Af Amer 08/01/2019 >60  >60 mL/min Final  . Anion gap 08/01/2019 7  5 - 15 Final   Performed at  [facility], [address] [place], [place] [address]  . WBC  08/01/2019 8.6  4.0 - 10.5 K/uL Final  . RBC 08/01/2019 4.43  3.87 - 5.11 MIL/uL Final  . Hemoglobin 08/01/2019 13.4  12.0 - 15.0 g/dL Final  . HCT 08/01/2019 38.7  36.0 - 46.0 % Final  . MCV 08/01/2019 87.4  80.0 - 100.0 fL Final  . MCH 08/01/2019 30.2  26.0 - 34.0 pg Final  . MCHC 08/01/2019 34.6  30.0 - 36.0 g/dL Final  . RDW 08/01/2019 12.5  11.5 - 15.5 % Final  . Platelets 08/01/2019 192  150 - 400 K/uL Final  . nRBC 08/01/2019 0.0  0.0 - 0.2 % Final  . Neutrophils Relative % 08/01/2019 68  % Final  . Neutro Abs 08/01/2019 5.9  1.7 - 7.7 K/uL Final  . Lymphocytes Relative 08/01/2019 21  % Final  . Lymphs Abs 08/01/2019 1.8  0.7 - 4.0 K/uL Final  . Monocytes Relative 08/01/2019 6  % Final  . Monocytes Absolute 08/01/2019 0.5  0.1 - 1.0 K/uL Final  . Eosinophils Relative 08/01/2019 3  % Final  . Eosinophils Absolute 08/01/2019 0.2  0.0 - 0.5 K/uL Final  . Basophils Relative 08/01/2019 1  % Final  . Basophils Absolute 08/01/2019 0.0  0.0 - 0.1 K/uL Final  . Immature Granulocytes 08/01/2019 1  % Final  . Abs Immature Granulocytes 08/01/2019 0.05  0.00 - 0.07 K/uL Final   Performed at [facility], [address], [place], [place] [address]    Assessment:  Brianna Rogers is a 73 y.o. female with stage IA right breast cancers/plumpectomyon 12/06/2017. Pathology revealed a 1.7 cm grade III invasive mammary carcinoma of no special type. There were biopsy site changes and a clip present. 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 Caudal margin re-excision revealed ductal and lobular carcinoma in situ. In situ carcinoma was <0.5 mm from the margin. Thermal artifact limited interpretation. New skin margin revealed in situ carcinoma <0.5 mm from the surgical margin. There was lymphovascular invasion. One sentinel lymph node was negative. Tumor was ER + (>90%), PR + (>90%) and Her2/neu  2+ (FISH negative). Pathologic stagewas pT1cN0.  Oncotype DX revealed a recurrence score of 9 (low risk). Distant recurrencce risk at 9 years with hormonal therapy was 3%. Absolute benefit of chemotherapy was <1%.  Diagnostic right mammogram and ultrasoundon 11/11/2017 revealed a suspicious mass in the 9 o'clock position of the right breast. Targeted ultrasound revealed a 1.5 x 2.2 x 1.9 cm mass with posterior acoustic shadowing 10 cm from the nipple. The right axilla was negative for adenopathy. Bilateral mammogramon 10/27/2018 revealed no evidence of malignancy in either breast.  She received radiationfrom 01/27/2018 - 03/03/2018. She began [facility] 03/15/2018.  She switched to Aromasin on 06/09/2019 and has been back on Femara x 1 month.  CA27.29 has been followed: 24.7 on 11/24/2017,22.5 on 03/15/2018, 16.0 on 10/31/2018, 22.6 on 04/05/2019, and 17.4 on 08/01/2019.  Bone densityon 12/09/2015 revealed osteopeniawith a T-score of -1.3 in the left hip. Bone densityon 12/14/2017 revealedosteopeniawith a T-score of -1.4 in the left femoral neck. She began Proliaon 04/15/2018 (last 05/01/2019).  She has a family historyof breast cancer x 4 (mother and 3 paternal aunts) and "female cancer" (maternal great grandmother). [facility] genetic testingwas negative for BRCA1/2.  Symptomatically, she feels "good".  Exam is stable.  Plan: 1.   Labs today: CBC with diff, CMP, CA 27.29. 2.   Stage IA RIGHT breast cancer Clinically she is doing well.  Exam reveals no evidence of recurrent disease.  CA27.29 is normal. She was on Aromasin, but has been back on Femara x 1 month. Bilateral mammogram on 10/30/2019. 3.   Osteopenia Patient last received Prolia on 05/01/2019. Continue Prolia every 6 months. Encourage calcium and vitamin D. Bone density on 12/15/2019. 4.   Joint pain Rheumatoid factor was negative. Etiology may be secondary to the aromatase inhibitor. Continue  Femara. 5.   RTC on 11/01/2019 for labs (BMP) + Prolia. 6.   RTC in 4 months for MD assessment and labs (CBC with diff, CMP, CA27.29).  I discussed the assessment and treatment plan with the patient.  The patient was provided an opportunity to ask questions and all were answered.  The patient agreed with the plan and demonstrated an understanding of the instructions.  The patient was advised to call back if the symptoms worsen or if the condition fails to improve as anticipated.  I provided 19 minutes of face-to-face time during this this encounter and > 50% was spent counseling as documented under my assessment and plan.    Lequita Asal, MD, PhD    08/01/2019, 9:03 AM  I, Selena Batten, am acting as scribe for [employer]. Mike Gip, MD, PhD.  I,  C. Mike Gip, MD, have reviewed the above documentation for accuracy and completeness, and I agree with the above.

## 2019-07-31 ENCOUNTER — Encounter: Payer: Self-pay | Admitting: Hematology and Oncology

## 2019-07-31 ENCOUNTER — Other Ambulatory Visit: Payer: Self-pay

## 2019-08-01 ENCOUNTER — Other Ambulatory Visit: Payer: Self-pay

## 2019-08-01 ENCOUNTER — Inpatient Hospital Stay: Payer: Medicare Other | Attending: Hematology and Oncology

## 2019-08-01 ENCOUNTER — Inpatient Hospital Stay: Payer: Medicare Other | Admitting: Hematology and Oncology

## 2019-08-01 ENCOUNTER — Encounter: Payer: Self-pay | Admitting: Hematology and Oncology

## 2019-08-01 VITALS — BP 165/90 | HR 61 | Temp 97.4°F | Resp 18 | Ht 59.0 in | Wt 152.1 lb

## 2019-08-01 DIAGNOSIS — M85852 Other specified disorders of bone density and structure, left thigh: Secondary | ICD-10-CM

## 2019-08-01 DIAGNOSIS — C50911 Malignant neoplasm of unspecified site of right female breast: Secondary | ICD-10-CM

## 2019-08-01 DIAGNOSIS — M8580 Other specified disorders of bone density and structure, unspecified site: Secondary | ICD-10-CM | POA: Insufficient documentation

## 2019-08-01 DIAGNOSIS — Z170 Estrogen receptor positive status [ER+]: Secondary | ICD-10-CM | POA: Diagnosis not present

## 2019-08-01 DIAGNOSIS — Z79811 Long term (current) use of aromatase inhibitors: Secondary | ICD-10-CM | POA: Diagnosis not present

## 2019-08-01 DIAGNOSIS — I10 Essential (primary) hypertension: Secondary | ICD-10-CM | POA: Diagnosis not present

## 2019-08-01 DIAGNOSIS — M1990 Unspecified osteoarthritis, unspecified site: Secondary | ICD-10-CM

## 2019-08-01 LAB — COMPREHENSIVE METABOLIC PANEL
ALT: 18 U/L (ref 0–44)
AST: 15 U/L (ref 15–41)
Albumin: 3.8 g/dL (ref 3.5–5.0)
Alkaline Phosphatase: 43 U/L (ref 38–126)
Anion gap: 7 (ref 5–15)
BUN: 21 mg/dL (ref 8–23)
CO2: 28 mmol/L (ref 22–32)
Calcium: 9.9 mg/dL (ref 8.9–10.3)
Chloride: 102 mmol/L (ref 98–111)
Creatinine, Ser: 0.78 mg/dL (ref 0.44–1.00)
GFR calc Af Amer: >60 mL/min (ref >60)
GFR calc non Af Amer: >60 mL/min (ref >60)
Glucose, Bld: 236 mg/dL — ABNORMAL HIGH (ref 70–99)
Potassium: 4.6 mmol/L (ref 3.5–5.1)
Sodium: 137 mmol/L (ref 135–145)
Total Bilirubin: 0.7 mg/dL (ref 0.3–1.2)
Total Protein: 7 g/dL (ref 6.5–8.1)

## 2019-08-01 LAB — CBC WITH DIFFERENTIAL/PLATELET
Abs Immature Granulocytes: 0.05 10*3/uL (ref 0.00–0.07)
Basophils Absolute: 0 10*3/uL (ref 0.0–0.1)
Basophils Relative: 1 %
Eosinophils Absolute: 0.2 10*3/uL (ref 0.0–0.5)
Eosinophils Relative: 3 %
HCT: 38.7 % (ref 36.0–46.0)
Hemoglobin: 13.4 g/dL (ref 12.0–15.0)
Immature Granulocytes: 1 %
Lymphocytes Relative: 21 %
Lymphs Abs: 1.8 10*3/uL (ref 0.7–4.0)
MCH: 30.2 pg (ref 26.0–34.0)
MCHC: 34.6 g/dL (ref 30.0–36.0)
MCV: 87.4 fL (ref 80.0–100.0)
Monocytes Absolute: 0.5 10*3/uL (ref 0.1–1.0)
Monocytes Relative: 6 %
Neutro Abs: 5.9 10*3/uL (ref 1.7–7.7)
Neutrophils Relative %: 68 %
Platelets: 192 10*3/uL (ref 150–400)
RBC: 4.43 MIL/uL (ref 3.87–5.11)
RDW: 12.5 % (ref 11.5–15.5)
WBC: 8.6 10*3/uL (ref 4.0–10.5)
nRBC: 0 % (ref 0.0–0.2)

## 2019-08-02 LAB — CANCER ANTIGEN 27.29: CA 27.29: 17.4 U/mL (ref 0.0–38.6)

## 2019-10-30 ENCOUNTER — Ambulatory Visit
Admission: RE | Admit: 2019-10-30 | Discharge: 2019-10-30 | Disposition: A | Discharge status: Home / Self Care | Payer: Medicare Other | Source: Ambulatory Visit | Attending: Hematology and Oncology | Admitting: Hematology and Oncology

## 2019-10-30 DIAGNOSIS — Z923 Personal history of irradiation: Secondary | ICD-10-CM | POA: Diagnosis not present

## 2019-10-30 DIAGNOSIS — Z853 Personal history of malignant neoplasm of breast: Secondary | ICD-10-CM | POA: Diagnosis not present

## 2019-10-30 DIAGNOSIS — C50911 Malignant neoplasm of unspecified site of right female breast: Secondary | ICD-10-CM

## 2019-10-30 HISTORY — DX: Personal history of irradiation: Z92.3

## 2019-11-01 ENCOUNTER — Other Ambulatory Visit: Payer: Medicare Other

## 2019-11-01 ENCOUNTER — Ambulatory Visit: Payer: Medicare Other

## 2019-11-02 ENCOUNTER — Other Ambulatory Visit: Payer: Self-pay

## 2019-11-02 ENCOUNTER — Inpatient Hospital Stay: Payer: Medicare Other | Attending: Hematology and Oncology

## 2019-11-02 ENCOUNTER — Inpatient Hospital Stay: Payer: Medicare Other

## 2019-11-02 VITALS — BP 164/96 | HR 57 | Temp 97.9°F | Resp 18

## 2019-11-02 DIAGNOSIS — M8580 Other specified disorders of bone density and structure, unspecified site: Secondary | ICD-10-CM | POA: Insufficient documentation

## 2019-11-02 DIAGNOSIS — C50911 Malignant neoplasm of unspecified site of right female breast: Secondary | ICD-10-CM | POA: Diagnosis not present

## 2019-11-02 DIAGNOSIS — M85852 Other specified disorders of bone density and structure, left thigh: Secondary | ICD-10-CM

## 2019-11-02 LAB — CBC WITH DIFFERENTIAL/PLATELET
Abs Immature Granulocytes: 0.04 10*3/uL (ref 0.00–0.07)
Basophils Absolute: 0 10*3/uL (ref 0.0–0.1)
Basophils Relative: 1 %
Eosinophils Absolute: 0.2 10*3/uL (ref 0.0–0.5)
Eosinophils Relative: 3 %
HCT: 39.2 % (ref 36.0–46.0)
Hemoglobin: 13.1 g/dL (ref 12.0–15.0)
Immature Granulocytes: 1 %
Lymphocytes Relative: 26 %
Lymphs Abs: 2.1 10*3/uL (ref 0.7–4.0)
MCH: 29.6 pg (ref 26.0–34.0)
MCHC: 33.4 g/dL (ref 30.0–36.0)
MCV: 88.7 fL (ref 80.0–100.0)
Monocytes Absolute: 0.6 10*3/uL (ref 0.1–1.0)
Monocytes Relative: 8 %
Neutro Abs: 4.9 10*3/uL (ref 1.7–7.7)
Neutrophils Relative %: 61 %
Platelets: 208 10*3/uL (ref 150–400)
RBC: 4.42 MIL/uL (ref 3.87–5.11)
RDW: 13 % (ref 11.5–15.5)
WBC: 7.9 10*3/uL (ref 4.0–10.5)
nRBC: 0 % (ref 0.0–0.2)

## 2019-11-02 LAB — COMPREHENSIVE METABOLIC PANEL
ALT: 18 U/L (ref 0–44)
AST: 14 U/L — ABNORMAL LOW (ref 15–41)
Albumin: 3.7 g/dL (ref 3.5–5.0)
Alkaline Phosphatase: 46 U/L (ref 38–126)
Anion gap: 7 (ref 5–15)
BUN: 24 mg/dL — ABNORMAL HIGH (ref 8–23)
CO2: 28 mmol/L (ref 22–32)
Calcium: 9.9 mg/dL (ref 8.9–10.3)
Chloride: 100 mmol/L (ref 98–111)
Creatinine, Ser: 0.68 mg/dL (ref 0.44–1.00)
GFR calc Af Amer: >60 mL/min (ref >60)
GFR calc non Af Amer: >60 mL/min (ref >60)
Glucose, Bld: 227 mg/dL — ABNORMAL HIGH (ref 70–99)
Potassium: 4.1 mmol/L (ref 3.5–5.1)
Sodium: 135 mmol/L (ref 135–145)
Total Bilirubin: 0.9 mg/dL (ref 0.3–1.2)
Total Protein: 7.1 g/dL (ref 6.5–8.1)

## 2019-11-02 MED ORDER — DENOSUMAB 60 MG/ML ~~LOC~~ SOSY
60.0000 mg | PREFILLED_SYRINGE | Freq: Once | SUBCUTANEOUS | Status: AC
  Administered 2019-11-02: 09:00:00 60 mg via SUBCUTANEOUS
  Filled 2019-11-02: qty 1

## 2019-11-02 MED ORDER — SODIUM CHLORIDE 0.9 % IV SOLN
Freq: Once | INTRAVENOUS | Status: DC
  Filled 2019-11-02: qty 250

## 2019-11-02 NOTE — Patient Instructions (Signed)
Denosumab injection What is this medicine? DENOSUMAB (den oh sue mab) slows bone breakdown. Prolia is used to treat osteoporosis in women after menopause and in men, and in people who are taking corticosteroids for 6 months or more. Xgeva is used to treat a high calcium level due to cancer and to prevent bone fractures and other bone problems caused by multiple myeloma or cancer bone metastases. Xgeva is also used to treat giant cell tumor of the bone. This medicine may be used for other purposes; ask your health care provider or pharmacist if you have questions. COMMON BRAND NAME(S): Prolia, XGEVA What should I tell my health care provider before I take this medicine? They need to know if you have any of these conditions:  dental disease  having surgery or tooth extraction  infection  kidney disease  low levels of calcium or Vitamin D in the blood  malnutrition  on hemodialysis  skin conditions or sensitivity  thyroid or parathyroid disease  an unusual reaction to denosumab, other medicines, foods, dyes, or preservatives  pregnant or trying to get pregnant  breast-feeding How should I use this medicine? This medicine is for injection under the skin. It is given by a health care professional in a hospital or clinic setting. A special MedGuide will be given to you before each treatment. Be sure to read this information carefully each time. For Prolia, talk to your pediatrician regarding the use of this medicine in children. Special care may be needed. For Xgeva, talk to your pediatrician regarding the use of this medicine in children. While this drug may be prescribed for children as young as 13 years for selected conditions, precautions do apply. Overdosage: If you think you have taken too much of this medicine contact a poison control center or emergency room at once. NOTE: This medicine is only for you. Do not share this medicine with others. What if I miss a dose? It is  important not to miss your dose. Call your doctor or health care professional if you are unable to keep an appointment. What may interact with this medicine? Do not take this medicine with any of the following medications:  other medicines containing denosumab This medicine may also interact with the following medications:  medicines that lower your chance of fighting infection  steroid medicines like prednisone or cortisone This list may not describe all possible interactions. Give your health care provider a list of all the medicines, herbs, non-prescription drugs, or dietary supplements you use. Also tell them if you smoke, drink alcohol, or use illegal drugs. Some items may interact with your medicine. What should I watch for while using this medicine? Visit your doctor or health care professional for regular checks on your progress. Your doctor or health care professional may order blood tests and other tests to see how you are doing. Call your doctor or health care professional for advice if you get a fever, chills or sore throat, or other symptoms of a cold or flu. Do not treat yourself. This drug may decrease your body's ability to fight infection. Try to avoid being around people who are sick. You should make sure you get enough calcium and vitamin D while you are taking this medicine, unless your doctor tells you not to. Discuss the foods you eat and the vitamins you take with your health care professional. See your dentist regularly. Brush and floss your teeth as directed. Before you have any dental work done, tell your dentist you are   receiving this medicine. Do not become pregnant while taking this medicine or for 5 months after stopping it. Talk with your doctor or health care professional about your birth control options while taking this medicine. Women should inform their doctor if they wish to become pregnant or think they might be pregnant. There is a potential for serious side  effects to an unborn child. Talk to your health care professional or pharmacist for more information. What side effects may I notice from receiving this medicine? Side effects that you should report to your doctor or health care professional as soon as possible:  allergic reactions like skin rash, itching or hives, swelling of the face, lips, or tongue  bone pain  breathing problems  dizziness  jaw pain, especially after dental work  redness, blistering, peeling of the skin  signs and symptoms of infection like fever or chills; cough; sore throat; pain or trouble passing urine  signs of low calcium like fast heartbeat, muscle cramps or muscle pain; pain, tingling, numbness in the hands or feet; seizures  unusual bleeding or bruising  unusually weak or tired Side effects that usually do not require medical attention (report to your doctor or health care professional if they continue or are bothersome):  constipation  diarrhea  headache  joint pain  loss of appetite  muscle pain  runny nose  tiredness  upset stomach This list may not describe all possible side effects. Call your doctor for medical advice about side effects. You may report side effects to FDA at 1-800-FDA-1088. Where should I keep my medicine? This medicine is only given in a clinic, doctor's office, or other health care setting and will not be stored at home. NOTE: This sheet is a summary. It may not cover all possible information. If you have questions about this medicine, talk to your doctor, pharmacist, or health care provider.  2020 Elsevier/Gold Standard (2018-01-14 16:10:44)

## 2019-11-02 NOTE — Progress Notes (Signed)
Patient reports that her primary has adjusted her blood pressure and diabetes medication this week.  She is being followed closely for both issues and has no complaints today.

## 2019-11-03 LAB — CANCER ANTIGEN 27.29: CA 27.29: 21.7 U/mL (ref 0.0–38.6)

## 2019-11-24 ENCOUNTER — Other Ambulatory Visit: Payer: Self-pay

## 2019-11-24 DIAGNOSIS — M85852 Other specified disorders of bone density and structure, left thigh: Secondary | ICD-10-CM

## 2019-11-24 DIAGNOSIS — C50911 Malignant neoplasm of unspecified site of right female breast: Secondary | ICD-10-CM

## 2019-11-24 DIAGNOSIS — Z170 Estrogen receptor positive status [ER+]: Secondary | ICD-10-CM

## 2019-11-29 ENCOUNTER — Other Ambulatory Visit: Payer: Self-pay

## 2019-11-29 ENCOUNTER — Inpatient Hospital Stay: Payer: Medicare Other

## 2019-11-29 ENCOUNTER — Encounter: Payer: Self-pay | Admitting: Oncology

## 2019-11-29 ENCOUNTER — Inpatient Hospital Stay: Payer: Medicare Other | Attending: Hematology and Oncology | Admitting: Oncology

## 2019-11-29 VITALS — BP 167/71 | HR 70 | Temp 97.5°F | Resp 18 | Ht 59.0 in | Wt 154.7 lb

## 2019-11-29 DIAGNOSIS — M8580 Other specified disorders of bone density and structure, unspecified site: Secondary | ICD-10-CM | POA: Insufficient documentation

## 2019-11-29 DIAGNOSIS — Z79811 Long term (current) use of aromatase inhibitors: Secondary | ICD-10-CM | POA: Insufficient documentation

## 2019-11-29 DIAGNOSIS — C50411 Malignant neoplasm of upper-outer quadrant of right female breast: Secondary | ICD-10-CM | POA: Diagnosis not present

## 2019-11-29 DIAGNOSIS — Z170 Estrogen receptor positive status [ER+]: Secondary | ICD-10-CM | POA: Diagnosis not present

## 2019-11-29 DIAGNOSIS — L304 Erythema intertrigo: Secondary | ICD-10-CM | POA: Diagnosis not present

## 2019-11-29 DIAGNOSIS — C50911 Malignant neoplasm of unspecified site of right female breast: Secondary | ICD-10-CM | POA: Insufficient documentation

## 2019-11-29 LAB — COMPREHENSIVE METABOLIC PANEL
ALT: 20 U/L (ref 0–44)
AST: 15 U/L (ref 15–41)
Albumin: 3.7 g/dL (ref 3.5–5.0)
Alkaline Phosphatase: 48 U/L (ref 38–126)
Anion gap: 12 (ref 5–15)
BUN: 24 mg/dL — ABNORMAL HIGH (ref 8–23)
CO2: 24 mmol/L (ref 22–32)
Calcium: 9.5 mg/dL (ref 8.9–10.3)
Chloride: 97 mmol/L — ABNORMAL LOW (ref 98–111)
Creatinine, Ser: 0.75 mg/dL (ref 0.44–1.00)
GFR calc Af Amer: >60 mL/min (ref >60)
GFR calc non Af Amer: >60 mL/min (ref >60)
Glucose, Bld: 284 mg/dL — ABNORMAL HIGH (ref 70–99)
Potassium: 4.3 mmol/L (ref 3.5–5.1)
Sodium: 133 mmol/L — ABNORMAL LOW (ref 135–145)
Total Bilirubin: 0.5 mg/dL (ref 0.3–1.2)
Total Protein: 7.1 g/dL (ref 6.5–8.1)

## 2019-11-29 LAB — CBC WITH DIFFERENTIAL/PLATELET
Abs Immature Granulocytes: 0.04 10*3/uL (ref 0.00–0.07)
Basophils Absolute: 0.1 10*3/uL (ref 0.0–0.1)
Basophils Relative: 1 %
Eosinophils Absolute: 0.2 10*3/uL (ref 0.0–0.5)
Eosinophils Relative: 3 %
HCT: 38.9 % (ref 36.0–46.0)
Hemoglobin: 13.3 g/dL (ref 12.0–15.0)
Immature Granulocytes: 1 %
Lymphocytes Relative: 24 %
Lymphs Abs: 1.7 10*3/uL (ref 0.7–4.0)
MCH: 30 pg (ref 26.0–34.0)
MCHC: 34.2 g/dL (ref 30.0–36.0)
MCV: 87.6 fL (ref 80.0–100.0)
Monocytes Absolute: 0.5 10*3/uL (ref 0.1–1.0)
Monocytes Relative: 7 %
Neutro Abs: 4.5 10*3/uL (ref 1.7–7.7)
Neutrophils Relative %: 64 %
Platelets: 213 10*3/uL (ref 150–400)
RBC: 4.44 MIL/uL (ref 3.87–5.11)
RDW: 13 % (ref 11.5–15.5)
WBC: 7.1 10*3/uL (ref 4.0–10.5)
nRBC: 0 % (ref 0.0–0.2)

## 2019-11-29 MED ORDER — NYSTATIN 100000 UNIT/GM EX POWD: 1.0000 "application " | Freq: Three times a day (TID) | CUTANEOUS | 0 refills | Status: DC

## 2019-11-29 NOTE — Progress Notes (Signed)
No new changes noted today 

## 2019-11-29 NOTE — Progress Notes (Signed)
[facility]  [address], [address] [place], [place] [address] Phone: [phone]  Fax: [phone]   Clinic Day:  11/29/2019  Referring physician: Sallee Lange, *  Chief Complaint: Brianna Rogers is a 74 y.o. female with stage IA right breast cancer and osteopenia who is seen for follow-up.   HPI: The patient was last seen in the medical oncology clinic on 08/01/2019.  At that time, she admitted to feeling "good".  She had joint pain and stiffness in her hands.  Her neuropathy remained unchanged.  She noted a few muscle spasms.  Admitted to some chest and arm soreness secondary to moving boxes into her new home.  She had recently started back on Femara.   She received Prolia injection on 11/02/2019.  Bilateral breast mammogram on 10/30/2019 No evidence of malignancy within either breast. Expected postsurgical and post radiation changes within the RIGHT breast.  During the interim, she has been doing well.  She continues to have swelling and pain in her joints of both hands.  Her neuropathy remains unchanged.  She notes persistent right arm swelling and pain.  She was evaluated by Dr. Windell Moment last week who recommended possible gabapentin for nerve pain due to lymph node removal.  She continues her Femara as prescribed.   She was evaluated by dermatology on 11/01/2019 for vaginal itchiness and skin concern.  She was started on clobetasol ointment and Desitin diaper cream without much improvement.  She noted to have some plaque like intertrigo under left breast and skin folds likely related to uncontrolled diabetes.  She was started on fluconazole 150 mg tablets x3 days.  Improvement noted.  Patient has been working with her PCP to control her diabetes.  Currently A1c is 10.5.  She is recently started taking her Lantus at bedtime.  She has an implanted blood glucose monitor in her left arm to prevent frequent blood sugar sticks.  She is watching her diet  and she is incorporating exercise.  Her weight is stable.   Past Medical History:  Diagnosis Date  . Breast cancer ([place])   . Breast cancer, right ([place]) 11/18/2017  . Diabetes mellitus without complication ([place])   . Heart murmur   . Hypertension   . Lichen sclerosus et atrophicus   . Personal history of radiation therapy     Past Surgical History:  Procedure Laterality Date  . ABDOMINAL HYSTERECTOMY    . APPENDECTOMY    . BREAST BIOPSY Right 11/18/2017   INVASIVE MAMMARY CARCINOMA.   Marland Kitchen BREAST LUMPECTOMY Right 12/06/2017   invasive mammary carcinoma DCIS LCIS  with RAD  . COLONOSCOPY    . COLONOSCOPY WITH PROPOFOL N/A 11/15/2017   Procedure: COLONOSCOPY WITH PROPOFOL;  Surgeon: Lollie Sails, MD;  Location: [facility] ENDOSCOPY;  Service: Endoscopy;  Laterality: N/A;  . PARTIAL MASTECTOMY WITH NEEDLE LOCALIZATION Right 12/06/2017   Procedure: PARTIAL MASTECTOMY WITH NEEDLE LOCALIZATION;  Surgeon: Herbert Pun, MD;  Location: [facility] ORS;  Service: General;  Laterality: Right;  . SENTINEL NODE BIOPSY Right 12/06/2017   Procedure: SENTINEL NODE BIOPSY;  Surgeon: Herbert Pun, MD;  Location: [facility] ORS;  Service: General;  Laterality: Right;  . TUBAL LIGATION      Family History  Problem Relation Age of Onset  . Breast cancer Mother 13  . Cancer Mother   . Cancer Maternal Aunt   . Cancer Maternal Grandmother     Social History:  reports that she has never smoked. She has never used smokeless  tobacco. She reports that she does not drink alcohol or use drugs.  She is an only child. Her mother passed away in 01/19/2019. She no longer has to travel to [facility] to take care of her animals. She lives in [place]. Patient is retired from [employer]. The patient is alone today.  Allergies:  Allergies  Allergen Reactions  . Alendronate Itching  . Bactrim [Sulfamethoxazole-Trimethoprim] Other (See Comments)    Unknown  . Empagliflozin Itching  . Macrodantin  [Nitrofurantoin Macrocrystal] Other (See Comments)    Unknown  . Sulfa Antibiotics Other (See Comments)    Flu like symptoms    Current Medications: Current Outpatient Medications  Medication Sig Dispense Refill  . acetaminophen (TYLENOL) 500 MG tablet Take 500 mg by mouth every 6 (six) hours as needed for moderate pain or headache.    Marland Kitchen aspirin EC 81 MG tablet Take 81 mg by mouth every evening.     . B-D ULTRAFINE III SHORT PEN 31G X 8 MM MISC U UTD    . Continuous Blood Gluc Sensor (FREESTYLE LIBRE SENSOR SYSTEM) MISC Use 3 each every 10 (ten) days    . denosumab (PROLIA) 60 MG/ML SOSY injection Inject 60 mg into the skin every 6 (six) months.    Marland Kitchen exemestane (AROMASIN) 25 MG tablet TAKE 1 TABLET BY MOUTH EVERY DAY AFTER BREAKFAST 90 tablet 1  . fluocinonide ointment (LIDEX) 0.05 % Apply topically. Apply topically daily. Apply to areas of rash daily x3 weeks per month; avoid use on face and skin folds    . glimepiride (AMARYL) 4 MG tablet Take 4 mg by mouth 2 (two) times daily at 10 AM and 5 PM. 4 MG AM, 2 MG PM    . imiquimod (ALDARA) 5 % cream Apply topically as needed.     Marland Kitchen LANTUS SOLOSTAR 100 UNIT/ML Solostar Pen 24 Units at bedtime.     Marland Kitchen letrozole (FEMARA) 2.5 MG tablet Take 1 tablet (2.5 mg total) by mouth daily. 30 tablet 0  . metFORMIN (GLUCOPHAGE) 1000 MG tablet Take 1,000 mg by mouth 2 (two) times daily with a meal.    . Multiple Vitamin (MULTIVITAMIN) tablet Take 1 tablet by mouth daily.    . Omega-3 Fatty Acids (FISH OIL) 1200 MG CAPS Take 1,200 mg by mouth 2 (two) times daily.     Marland Kitchen PREVIDENT 5000 DRY MOUTH 1.1 % GEL dental gel BRUSH WITH PASTE 2 TO 3 TIMES D. DO NOT RINSE.    Marland Kitchen quinapril (ACCUPRIL) 20 MG tablet Take 10 mg by mouth 2 (two) times daily at 10 AM and 5 PM.     . rosuvastatin (CRESTOR) 10 MG tablet Take 5 mg by mouth every other day.     . clobetasol ointment (TEMOVATE) 4.25 % Apply 1 application topically as needed.    Marland Kitchen ibuprofen (ADVIL,MOTRIN) 200 MG tablet  Take 200 mg by mouth every 6 (six) hours as needed for headache or moderate pain.     No current facility-administered medications for this visit.    Review of Systems  Constitutional: Positive for malaise/fatigue. Negative for chills, fever and weight loss.  HENT: Negative for congestion, ear pain and tinnitus.   Eyes: Negative.  Negative for blurred vision and double vision.  Respiratory: Negative.  Negative for cough, sputum production and shortness of breath.   Cardiovascular: Negative.  Negative for chest pain, palpitations and leg swelling.  Gastrointestinal: Negative.  Negative for abdominal pain, constipation, diarrhea, nausea and vomiting.  Genitourinary:  Negative for dysuria, frequency and urgency.  Musculoskeletal: Positive for joint pain (Bilateral hands). Negative for back pain and falls.       Right axillary swelling  Skin: Negative.  Negative for rash.  Neurological: Negative.  Negative for weakness and headaches.  Endo/Heme/Allergies: Negative.  Does not bruise/bleed easily.  Psychiatric/Behavioral: Negative.  Negative for depression. The patient is not nervous/anxious and does not have insomnia.    Performance status (ECOG): 1  Vitals Blood pressure (!) 167/71, pulse 70, temperature (!) 97.5 F (36.4 C), temperature source Tympanic, resp. rate 18, height '4\' 11"'  (1.499 m), weight 154 lb 10.4 oz (70.1 kg), SpO2 98 %.  Physical Exam  Constitutional: She is oriented to person, place, and time. Vital signs are normal. She appears well-developed and well-nourished.  HENT:  Head: Normocephalic and atraumatic.  Eyes: Pupils are equal, round, and reactive to light.  Cardiovascular: Normal rate and regular rhythm.  No murmur heard. Pulmonary/Chest: Effort normal and breath sounds normal. She has no wheezes.  Abdominal: Soft. Bowel sounds are normal. She exhibits no distension and no mass. There is no abdominal tenderness.  Musculoskeletal:        General: No edema. Normal  range of motion.     Cervical back: Normal range of motion.     Comments: Bilateral hand swelling and joint tenderness  Lymphadenopathy:  Right axillary swelling and tenderness  Neurological: She is alert and oriented to person, place, and time.  Skin: Skin is warm and dry.  Psychiatric: She has a normal mood and affect. Her behavior is normal.    Appointment on 11/29/2019  Component Date Value Ref Range Status  . Sodium 11/29/2019 133* 135 - 145 mmol/L Final  . Potassium 11/29/2019 4.3  3.5 - 5.1 mmol/L Final  . Chloride 11/29/2019 97* 98 - 111 mmol/L Final  . CO2 11/29/2019 24  22 - 32 mmol/L Final  . Glucose, Bld 11/29/2019 284* 70 - 99 mg/dL Final   Glucose reference range applies only to samples taken after fasting for at least 8 hours.  . BUN 11/29/2019 24* 8 - 23 mg/dL Final  . Creatinine, Ser 11/29/2019 0.75  0.44 - 1.00 mg/dL Final  . Calcium 11/29/2019 9.5  8.9 - 10.3 mg/dL Final  . Total Protein 11/29/2019 7.1  6.5 - 8.1 g/dL Final  . Albumin 11/29/2019 3.7  3.5 - 5.0 g/dL Final  . AST 11/29/2019 15  15 - 41 U/L Final  . ALT 11/29/2019 20  0 - 44 U/L Final  . Alkaline Phosphatase 11/29/2019 48  38 - 126 U/L Final  . Total Bilirubin 11/29/2019 0.5  0.3 - 1.2 mg/dL Final  . GFR calc non Af Amer 11/29/2019 >60  >60 mL/min Final  . GFR calc Af Amer 11/29/2019 >60  >60 mL/min Final  . Anion gap 11/29/2019 12  5 - 15 Final   Performed at [facility], [address], [place], [place] [address]  . WBC 11/29/2019 7.1  4.0 - 10.5 K/uL Final  . RBC 11/29/2019 4.44  3.87 - 5.11 MIL/uL Final  . Hemoglobin 11/29/2019 13.3  12.0 - 15.0 g/dL Final  . HCT 11/29/2019 38.9  36.0 - 46.0 % Final  . MCV 11/29/2019 87.6  80.0 - 100.0 fL Final  . MCH 11/29/2019 30.0  26.0 - 34.0 pg Final  . MCHC 11/29/2019 34.2  30.0 - 36.0 g/dL Final  . RDW 11/29/2019 13.0  11.5 - 15.5 % Final  . Platelets 11/29/2019 213  150 - 400 K/uL Final  . nRBC 11/29/2019 0.0  0.0 - 0.2 % Final  .  Neutrophils Relative % 11/29/2019 64  % Final  . Neutro Abs 11/29/2019 4.5  1.7 - 7.7 K/uL Final  . Lymphocytes Relative 11/29/2019 24  % Final  . Lymphs Abs 11/29/2019 1.7  0.7 - 4.0 K/uL Final  . Monocytes Relative 11/29/2019 7  % Final  . Monocytes Absolute 11/29/2019 0.5  0.1 - 1.0 K/uL Final  . Eosinophils Relative 11/29/2019 3  % Final  . Eosinophils Absolute 11/29/2019 0.2  0.0 - 0.5 K/uL Final  . Basophils Relative 11/29/2019 1  % Final  . Basophils Absolute 11/29/2019 0.1  0.0 - 0.1 K/uL Final  . Immature Granulocytes 11/29/2019 1  % Final  . Abs Immature Granulocytes 11/29/2019 0.04  0.00 - 0.07 K/uL Final   Performed at [facility], [address], [place], [place] [address]    Assessment:  Brianna Rogers is a 74 y.o. female with stage IA right breast cancers/plumpectomyon 12/06/2017. Pathology revealed a 1.7 cm grade III invasive mammary carcinoma of no special type. There were biopsy site changes and a clip present. 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 Caudal margin re-excision revealed ductal and lobular carcinoma in situ. In situ carcinoma was <0.5 mm from the margin. Thermal artifact limited interpretation. New skin margin revealed in situ carcinoma <0.5 mm from the surgical margin. There was lymphovascular invasion. One sentinel lymph node was negative. Tumor was ER + (>90%), PR + (>90%) and Her2/neu 2+ (FISH negative). Pathologic stagewas pT1cN0.  Oncotype DX revealed a recurrence score of 9 (low risk). Distant recurrencce risk at 9 years with hormonal therapy was 3%. Absolute benefit of chemotherapy was <1%.  Diagnostic right mammogram and ultrasoundon 11/11/2017 revealed a suspicious mass in the 9 o'clock position of the right breast. Targeted ultrasound revealed a 1.5 x 2.2 x 1.9 cm mass with posterior acoustic shadowing 10 cm from the nipple. The right axilla was negative for adenopathy. Bilateral mammogramon 10/27/2018  revealed no evidence of malignancy in either breast.  She received radiationfrom 01/27/2018 - 03/03/2018. She began [facility] 03/15/2018.  She switched to Aromasin on 06/09/2019 and has been back on Femara x 1 month.  CA27.29 has been followed: 24.7 on 11/24/2017,22.5 on 03/15/2018, 16.0 on 10/31/2018, 22.6 on 04/05/2019, and 17.4 on 08/01/2019.  Bone densityon 12/09/2015 revealed osteopeniawith a T-score of -1.3 in the left hip. Bone densityon 12/14/2017 revealedosteopeniawith a T-score of -1.4 in the left femoral neck. She began Proliaon 04/15/2018 (last 05/01/2019).  She has a family historyof breast cancer x 4 (mother and 3 paternal aunts) and "female cancer" (maternal great grandmother). [facility] genetic testingwas negative for BRCA1/2.  Symptomatically, she feels "good".  Exam is stable.  Plan: 1.   Labs today: CBC with diff, CMP, CA 27.29. 2.   Stage IA RIGHT breast cancer Clinically she is doing well.  Exam reveals no evidence of recurrent disease.  CA27.29 is normal. She was on Aromasin, but has been back on Femara x several months Bilateral mammogram on 10/30/2019-BIRADS 2 3.   Osteopenia Patient last received Prolia on 11/02/19 Continue Prolia every 6 months. Encourage calcium and vitamin D. Bone density on 12/15/2019. 4.   Joint pain Rheumatoid factor was negative. Etiology may be secondary to the aromatase inhibitor. Continue Femara. 5.     Left breast intertrigo  Continue fluconazole per dermatologist  Rx nystatin powder 6.     Right axillary discomfort  and swelling  Referral to lymphedema clinic 7.     RTC in 5 months for MD assessment and labs (CBC with diff, CMP, CA27.29) and prolia injection.  I discussed the assessment and treatment plan with the patient.  The patient was provided an opportunity to ask questions and all were answered.  The patient agreed with the plan and demonstrated an understanding of the instructions.  The patient was advised  to call back if the symptoms worsen or if the condition fails to improve as anticipated.  Greater than 50% was spent in counseling and coordination of care with this patient including but not limited to discussion of the relevant topics above (See A&P) including, but not limited to diagnosis and management of acute and chronic medical conditions.   Faythe Casa, NP 11/29/2019 11:01 AM

## 2019-11-30 LAB — CANCER ANTIGEN 27.29: CA 27.29: 25.6 U/mL (ref 0.0–38.6)

## 2019-12-07 ENCOUNTER — Encounter: Payer: Self-pay | Admitting: Occupational Therapy

## 2019-12-07 ENCOUNTER — Ambulatory Visit: Payer: Medicare Other | Attending: Oncology | Admitting: Occupational Therapy

## 2019-12-07 ENCOUNTER — Other Ambulatory Visit: Payer: Self-pay

## 2019-12-07 DIAGNOSIS — M25542 Pain in joints of left hand: Secondary | ICD-10-CM

## 2019-12-07 DIAGNOSIS — R600 Localized edema: Secondary | ICD-10-CM | POA: Diagnosis present

## 2019-12-07 DIAGNOSIS — M25642 Stiffness of left hand, not elsewhere classified: Secondary | ICD-10-CM | POA: Diagnosis present

## 2019-12-07 DIAGNOSIS — M25541 Pain in joints of right hand: Secondary | ICD-10-CM | POA: Diagnosis present

## 2019-12-07 DIAGNOSIS — M25641 Stiffness of right hand, not elsewhere classified: Secondary | ICD-10-CM | POA: Insufficient documentation

## 2019-12-07 NOTE — Patient Instructions (Signed)
Contrast 3  X day  Pain free tendon glides - but keep pain under 2/10 and composite fist to 2 fingers out of palm , then gradually over next 2 wks to 1 finger and then palm if pain better  use joint protection principles  And isotone glove for night time and some during day   Get new bra's with wider band under arm - for compression on R thoracic

## 2019-12-07 NOTE — Therapy (Signed)
[place] [facility] [address], [place], [address] Phone: [phone]   Fax:  [phone]  Occupational Therapy Evaluation  Patient Details  Name: Brianna Rogers MRN: [id number] Date of Birth: [birth year] Referring Provider (OT): Faythe Casa   Encounter Date: 12/07/2019  OT End of Session - 12/07/19 1247    Visit Number  1    Number of Visits  4    Date for OT Re-Evaluation  02/01/20    OT Start Time  1050    OT Stop Time  1145    OT Time Calculation (min)  55 min    Activity Tolerance  Patient tolerated treatment well    Behavior During Therapy  [facility] for tasks assessed/performed       Past Medical History:  Diagnosis Date  . Breast cancer ([place])   . Breast cancer, right ([place]) 11/18/2017  . Diabetes mellitus without complication ([place])   . Heart murmur   . Hypertension   . Lichen sclerosus et atrophicus   . Personal history of radiation therapy     Past Surgical History:  Procedure Laterality Date  . ABDOMINAL HYSTERECTOMY    . APPENDECTOMY    . BREAST BIOPSY Right 11/18/2017   INVASIVE MAMMARY CARCINOMA.   Marland Kitchen BREAST LUMPECTOMY Right 12/06/2017   invasive mammary carcinoma DCIS LCIS  with RAD  . COLONOSCOPY    . COLONOSCOPY WITH PROPOFOL N/A 11/15/2017   Procedure: COLONOSCOPY WITH PROPOFOL;  Surgeon: Lollie Sails, MD;  Location: [facility] ENDOSCOPY;  Service: Endoscopy;  Laterality: N/A;  . PARTIAL MASTECTOMY WITH NEEDLE LOCALIZATION Right 12/06/2017   Procedure: PARTIAL MASTECTOMY WITH NEEDLE LOCALIZATION;  Surgeon: Herbert Pun, MD;  Location: [facility] ORS;  Service: General;  Laterality: Right;  . SENTINEL NODE BIOPSY Right 12/06/2017   Procedure: SENTINEL NODE BIOPSY;  Surgeon: Herbert Pun, MD;  Location: [facility] ORS;  Service: General;  Laterality: Right;  . TUBAL LIGATION      There were no vitals filed for this visit.  Subjective Assessment - 12/07/19 1236    Subjective   I  seen the NP at [facility] center - and had some swelling under my R arm and it is tender at times -and then my hands are really hurting int the joints when making fist -and they trigger a lot    Pertinent History  Pt had 12/06/17  R partial mastectomy with 2 axillary ln removed  and thenShe received radiation from 01/27/2018 - 03/03/2018.  She began Femara on 03/15/2018.  She switched to Aromasin on 06/09/2019 and has been back on Femara x 1 month.- seen NP last week and refer because of some swelling under R arm    Patient Stated Goals  Want to make sure the swelling do not get better and if I can get my hands better    Currently in Pain?  Yes    Pain Score  6     Pain Location  Hand    Pain Orientation  Right;Left    Pain Descriptors / Indicators  Aching;Tender    Pain Type  Acute pain    Pain Onset  More than a month ago    Pain Frequency  Intermittent    Aggravating Factors   making fist - gripping and in the am        [facility] OT Assessment - 12/07/19 0001      Assessment   Medical Diagnosis  R breast CA with swelling and bilateral hand  pain     Referring Provider (OT)  Faythe Casa    Onset Date/Surgical Date  12/06/17    Hand Dominance  Right      Home  Environment   Lives With  Alone      Prior Function   Vocation  Retired    Leisure  want to start workout again yardwork , coloring book, do own house work       Right Hand AROM   R Index  MCP 0-90  70 Degrees    R Index PIP 0-100  100 Degrees    R Long  MCP 0-90  70 Degrees    R Long PIP 0-100  90 Degrees    R Ring  MCP 0-90  90 Degrees    R Ring PIP 0-100  90 Degrees    R Little  MCP 0-90  90 Degrees    R Little PIP 0-100  100 Degrees      Left Hand AROM   L Index  MCP 0-90  90 Degrees    L Index PIP 0-100  80 Degrees    L Long  MCP 0-90  90 Degrees    L Long PIP 0-100  90 Degrees    L Ring  MCP 0-90  80 Degrees    L Ring PIP 0-100  80 Degrees    L Little  MCP 0-90  90 Degrees    L Little PIP 0-100  90 Degrees        LYMPHEDEMA/ONCOLOGY QUESTIONNAIRE - 12/07/19 1106      Right Upper Extremity Lymphedema   15 cm Proximal to Olecranon Process  33 cm    10 cm Proximal to Olecranon Process  29 cm    Olecranon Process  24 cm    15 cm Proximal to Ulnar Styloid Process  23 cm    10 cm Proximal to Ulnar Styloid Process  19.8 cm    Just Proximal to Ulnar Styloid Process  15 cm    Across Hand at [employer]  19 cm    At [place] of 2nd Digit  6.4 cm    At [facility] of Thumb  6.4 cm      Left Upper Extremity Lymphedema   15 cm Proximal to Olecranon Process  32.2 cm    10 cm Proximal to Olecranon Process  30.8 cm    Olecranon Process  24 cm    15 cm Proximal to Ulnar Styloid Process  23.4 cm    10 cm Proximal to Ulnar Styloid Process  19.6 cm    Just Proximal to Ulnar Styloid Process  15.5 cm    Across Hand at [employer]  18.5 cm    At [place] of 2nd Digit  7 cm    At [facility] of Thumb  6.4 cm        Conclussion:  Pt present at OT eval with some swelling under R arm on thoracic- pt bra's are old and narrow  - pt to get new bras that can be wider compression under arm -Her R UE show no increase circumference -  Pt do report pain with fisting and gripping with bilateral hands - worse in the am when getting up - pain 6/10 - and several times during and in session her fingers triggered - pt provided with HEP  And compression gloves for hands  - limiting her functional use of hands in ADL's and IADL's -  OT Education - 12/07/19 1247    Education Details  Findings and HEP    Person(s) Educated  Patient    Methods  Explanation;Demonstration;Tactile cues;Verbal cues;Handout    Comprehension  Verbal cues required;Returned demonstration;Verbalized understanding          OT Long Term Goals - 12/07/19 1252      OT LONG TERM GOAL #1   Title  Pt to wear new bra that provides compression for R thoracic edema with less tenderness    Baseline  Jeanelle Malling is old and cutting into what appear to be some  swelling under arm - - causing tome tenderness - less than 2/10    Time  4    Period  Weeks    Status  New    Target Date  01/04/20      OT LONG TERM GOAL #2   Title  Pt report pain in bilateral hands decrease to less than 2/10 with fisting and grip - and less triggering    Baseline  pain 6/10 pain with gripping and making fist and every time she makes fist - different fingers are triggering    Time  8    Period  Weeks    Status  New    Target Date  02/01/20            Plan - 12/07/19 1248    OT Occupational Profile and History  Problem Focused Assessment - Including review of records relating to presenting problem    Occupational performance deficits (Please refer to evaluation for details):  ADL's;IADL's;Leisure;Play    Body Structure / Function / Physical Skills  Flexibility;UE functional use;Pain;Edema;IADL;ADL    Rehab Potential  Fair    Clinical Decision Making  Limited treatment options, no task modification necessary    Comorbidities Affecting Occupational Performance:  May have comorbidities impacting occupational performance   neuropathy in hands and pain in hands from chemo drug?   Modification or Assistance to Complete Evaluation   No modification of tasks or assist necessary to complete eval    OT Frequency  Biweekly    OT Duration  8 weeks    OT Treatment/Interventions  Self-care/ADL training;Contrast Bath;DME and/or AE instruction;Manual Therapy;Patient/family education;Therapeutic exercise    Plan  Assess results from HEP    OT Home Exercise Plan  see pt instruction    Consulted and Agree with Plan of Care  Patient       Patient will benefit from skilled therapeutic intervention in order to improve the following deficits and impairments:   Body Structure / Function / Physical Skills: Flexibility, UE functional use, Pain, Edema, IADL, ADL       Visit Diagnosis: Pain in joint of left hand - Plan: Ot plan of care cert/re-cert  Pain in joint of right hand -  Plan: Ot plan of care cert/re-cert  Stiffness of left hand, not elsewhere classified - Plan: Ot plan of care cert/re-cert  Stiffness of right hand, not elsewhere classified - Plan: Ot plan of care cert/re-cert  Localized edema - Plan: Ot plan of care cert/re-cert    Problem List Patient Active Problem List   Diagnosis Date Noted  . Hypercalcemia 03/20/2018  . Osteopenia of neck of left femur 12/20/2017  . Breast cancer, right ([place]) 11/18/2017    Rosalyn Gess OTR/L,CLT 12/07/2019, 1:01 PM  [place] [facility] [address], [place], [address] Phone: [phone]   Fax:  [phone]  Name: Sayana Whittlesey  Dand[place] MRN: [id number] Date of Birth: [birth year]

## 2019-12-18 ENCOUNTER — Ambulatory Visit
Admission: RE | Admit: 2019-12-18 | Discharge: 2019-12-18 | Disposition: A | Discharge status: Home / Self Care | Payer: Medicare Other | Source: Ambulatory Visit | Attending: Hematology and Oncology | Admitting: Hematology and Oncology

## 2019-12-18 ENCOUNTER — Other Ambulatory Visit: Payer: Self-pay

## 2019-12-18 DIAGNOSIS — M85852 Other specified disorders of bone density and structure, left thigh: Secondary | ICD-10-CM | POA: Insufficient documentation

## 2019-12-25 ENCOUNTER — Other Ambulatory Visit: Payer: Self-pay

## 2019-12-25 ENCOUNTER — Ambulatory Visit: Payer: Medicare Other | Attending: Oncology | Admitting: Occupational Therapy

## 2019-12-25 DIAGNOSIS — R600 Localized edema: Secondary | ICD-10-CM | POA: Diagnosis present

## 2019-12-25 DIAGNOSIS — M25641 Stiffness of right hand, not elsewhere classified: Secondary | ICD-10-CM | POA: Diagnosis present

## 2019-12-25 DIAGNOSIS — M25542 Pain in joints of left hand: Secondary | ICD-10-CM

## 2019-12-25 DIAGNOSIS — M25541 Pain in joints of right hand: Secondary | ICD-10-CM | POA: Diagnosis not present

## 2019-12-25 DIAGNOSIS — M25642 Stiffness of left hand, not elsewhere classified: Secondary | ICD-10-CM | POA: Diagnosis present

## 2019-12-25 NOTE — Therapy (Signed)
[facility] [address], [place], [address] Phone: [phone]   Fax:  [phone]  Occupational Therapy Treatment  Patient Details  Name: Brianna Rogers MRN: [id number] Date of Birth: 1945-12-14 Referring Provider (OT): Faythe Casa   Encounter Date: 12/25/2019  OT End of Session - 12/25/19 1009    Visit Number  2    Number of Visits  4    Date for OT Re-Evaluation  02/01/20    OT Start Time  0928    OT Stop Time  1001    OT Time Calculation (min)  33 min    Activity Tolerance  Patient tolerated treatment well    Behavior During Therapy  [facility] for tasks assessed/performed       Past Medical History:  Diagnosis Date  . Breast cancer ([place])   . Breast cancer, right ([place]) 11/18/2017  . Diabetes mellitus without complication ([place])   . Heart murmur   . Hypertension   . Lichen sclerosus et atrophicus   . Personal history of radiation therapy     Past Surgical History:  Procedure Laterality Date  . ABDOMINAL HYSTERECTOMY    . APPENDECTOMY    . BREAST BIOPSY Right 11/18/2017   INVASIVE MAMMARY CARCINOMA.   Marland Kitchen BREAST LUMPECTOMY Right 12/06/2017   invasive mammary carcinoma DCIS LCIS  with RAD  . COLONOSCOPY    . COLONOSCOPY WITH PROPOFOL N/A 11/15/2017   Procedure: COLONOSCOPY WITH PROPOFOL;  Surgeon: Lollie Sails, MD;  Location: [facility] ENDOSCOPY;  Service: Endoscopy;  Laterality: N/A;  . PARTIAL MASTECTOMY WITH NEEDLE LOCALIZATION Right 12/06/2017   Procedure: PARTIAL MASTECTOMY WITH NEEDLE LOCALIZATION;  Surgeon: Herbert Pun, MD;  Location: [facility] ORS;  Service: General;  Laterality: Right;  . SENTINEL NODE BIOPSY Right 12/06/2017   Procedure: SENTINEL NODE BIOPSY;  Surgeon: Herbert Pun, MD;  Location: [facility] ORS;  Service: General;  Laterality: Right;  . TUBAL LIGATION      There were no vitals filed for this visit.  Subjective Assessment - 12/25/19 1006    Subjective   I went  that same day that I seen you -and stopped wearing my old under wire bra's and wearing these that has wider band under my arm and it feels much better - no swelling and I ordered even a lymphedema bra and it feels so much better - and then my hands still stiff and sore , and triggering - but wearing gloves help - but I did not do the hot and cold as I should have    Pertinent History  Pt had 12/06/17  R partial mastectomy with 2 axillary ln removed  and thenShe received radiation from 01/27/2018 - 03/03/2018.  She began Femara on 03/15/2018.  She switched to Aromasin on 06/09/2019 and has been back on Femara x 1 month.- seen NP last week and refer because of some swelling under R arm    Patient Stated Goals  Want to make sure the swelling do not get better and if I can get my hands better    Currently in Pain?  Yes    Pain Score  6     Pain Location  Hand    Pain Orientation  Right;Left    Pain Descriptors / Indicators  Tender   over A1pulleys -and PROM of digits extention   Pain Type  Acute pain    Pain Onset  More than a month ago    Pain Frequency  Intermittent  [facility] OT Assessment - 12/25/19 0001      Right Hand AROM   R Index  MCP 0-90  70 Degrees    R Index PIP 0-100  100 Degrees    R Long  MCP 0-90  70 Degrees    R Long PIP 0-100  95 Degrees    R Ring  MCP 0-90  90 Degrees    R Ring PIP 0-100  95 Degrees    R Little  MCP 0-90  90 Degrees    R Little PIP 0-100  100 Degrees      Left Hand AROM   L Index  MCP 0-90  75 Degrees    L Index PIP 0-100  95 Degrees    L Long  MCP 0-90  80 Degrees    L Long PIP 0-100  95 Degrees    L Ring  MCP 0-90  80 Degrees    L Ring PIP 0-100  90 Degrees    L Little  MCP 0-90  90 Degrees    L Little PIP 0-100  95 Degrees      LYMPHEDEMA/ONCOLOGY QUESTIONNAIRE - 12/25/19 0937      Right Upper Extremity Lymphedema   15 cm Proximal to Olecranon Process  31.5 cm    10 cm Proximal to Olecranon Process  28 cm    Olecranon Process  24.5 cm     15 cm Proximal to Ulnar Styloid Process  23 cm    Across Hand at [employer]  19 cm    At [place] of 2nd Digit  6.2 cm    At [facility] of Thumb  6.2 cm      Left Upper Extremity Lymphedema   15 cm Proximal to Olecranon Process  32 cm    10 cm Proximal to Olecranon Process  29.8 cm    Olecranon Process  24 cm       Pt wearing bra's with wider strap under arm -and no under wire - report feels better and not as swollen under arm - also ordered lymphedema bra that she wear - feels great  Circumference in  Bilateral Upper arms decrease - see flow sheet  Hands still sore with gripping and some triggering during day - did not do contrast as much  And feels the isotoner gloves helped a lot  This date pt show some increase AROM - but still tender over A1 pulley of R hand 3rd and 4th and L hand 2nd thru 4th  Still increase swelling over MC's of bilateral hands   reinforce importance of contrast in combination of other HEP  Report pain with PROM to digits extention and decrease extention of digits - add AROM pain free after contrast  Prior to tendon glides - and not to force - only AROM gentle  And ice massage over A1pulley's during day   Reinforce and remind pt on joint protection principles   Pt to return in 2 wks                OT Education - 12/25/19 1009    Education Details  HEP    Person(s) Educated  Patient    Methods  Explanation;Demonstration;Tactile cues;Verbal cues;Handout    Comprehension  Verbal cues required;Returned demonstration;Verbalized understanding          OT Long Term Goals - 12/07/19 1252      OT LONG TERM GOAL #1   Title  Pt to wear new bra that provides compression  for R thoracic edema with less tenderness    Baseline  Jeanelle Malling is old and cutting into what appear to be some swelling under arm - - causing tome tenderness - less than 2/10    Time  4    Period  Weeks    Status  New    Target Date  01/04/20      OT LONG TERM GOAL #2   Title  Pt  report pain in bilateral hands decrease to less than 2/10 with fisting and grip - and less triggering    Baseline  pain 6/10 pain with gripping and making fist and every time she makes fist - different fingers are triggering    Time  8    Period  Weeks    Status  New    Target Date  02/01/20            Plan - 12/25/19 1010    Clinical Impression Statement  Pt show decrease edema /lympedema under R arm - wearing better bra's providing compression -and bilateral UE circumference decrease -pt cont to have swelling over MC's of bilateral hands, decrease AROM and pain - tenderness over A1pulley of R 3rd and 4th and L 2nd thru 4th - reinforce for pt to do contrast and AROM - ice massage adn isotoner gloves on hands    OT Occupational Profile and History  Problem Focused Assessment - Including review of records relating to presenting problem    Occupational performance deficits (Please refer to evaluation for details):  ADL's;IADL's;Leisure;Play    Body Structure / Function / Physical Skills  Flexibility;UE functional use;Pain;Edema;IADL;ADL    Rehab Potential  Fair    Clinical Decision Making  Limited treatment options, no task modification necessary    Comorbidities Affecting Occupational Performance:  May have comorbidities impacting occupational performance    Modification or Assistance to Complete Evaluation   No modification of tasks or assist necessary to complete eval    OT Frequency  Biweekly    OT Duration  6 weeks    OT Treatment/Interventions  Self-care/ADL training;Contrast Bath;DME and/or AE instruction;Manual Therapy;Patient/family education;Therapeutic exercise    Plan  Assess results from HEP    OT Home Exercise Plan  see pt instruction    Consulted and Agree with Plan of Care  Patient       Patient will benefit from skilled therapeutic intervention in order to improve the following deficits and impairments:   Body Structure / Function / Physical Skills: Flexibility, UE  functional use, Pain, Edema, IADL, ADL       Visit Diagnosis: Pain in joint of right hand  Pain in joint of left hand  Stiffness of left hand, not elsewhere classified  Stiffness of right hand, not elsewhere classified  Localized edema    Problem List Patient Active Problem List   Diagnosis Date Noted  . Hypercalcemia 03/20/2018  . Osteopenia of neck of left femur 12/20/2017  . Breast cancer, right ([place]) 11/18/2017    Franchot Pollitt, Gwenette Greet OTR/l,CLT 12/25/2019, 10:12 AM  [facility] [address], [place], [address] Phone: [phone]   Fax:  [phone]  Name: Margreat Mccloskey MRN: [id number] Date of Birth: September 03, 1946

## 2019-12-25 NOTE — Patient Instructions (Signed)
Cont same HEP -but add tapping for digits extention to digits

## 2020-01-08 ENCOUNTER — Ambulatory Visit: Payer: Medicare Other | Admitting: Occupational Therapy

## 2020-01-15 ENCOUNTER — Ambulatory Visit: Payer: Medicare Other | Admitting: Occupational Therapy

## 2020-01-15 ENCOUNTER — Other Ambulatory Visit: Payer: Self-pay

## 2020-01-15 DIAGNOSIS — M25541 Pain in joints of right hand: Secondary | ICD-10-CM | POA: Diagnosis not present

## 2020-01-15 DIAGNOSIS — R600 Localized edema: Secondary | ICD-10-CM

## 2020-01-15 NOTE — Patient Instructions (Signed)
See note

## 2020-01-15 NOTE — Therapy (Signed)
[facility] [address] [place], [address] Phone: [phone]   Fax:  [phone]  Occupational Therapy Treatment  Patient Details  Name: Brianna Rogers MRN: [id number] Date of Birth: 06-04-1946 Referring Provider (OT): Faythe Casa   Encounter Date: 01/15/2020  OT End of Session - 01/15/20 1125    Visit Number  3    Number of Visits  4    Date for OT Re-Evaluation  02/01/20    OT Start Time  1015    OT Stop Time  1046    OT Time Calculation (min)  31 min    Activity Tolerance  Patient tolerated treatment well    Behavior During Therapy  [facility] for tasks assessed/performed       Past Medical History:  Diagnosis Date  . Breast cancer ([place])   . Breast cancer, right ([place]) 11/18/2017  . Diabetes mellitus without complication ([place])   . Heart murmur   . Hypertension   . Lichen sclerosus et atrophicus   . Personal history of radiation therapy     Past Surgical History:  Procedure Laterality Date  . ABDOMINAL HYSTERECTOMY    . APPENDECTOMY    . BREAST BIOPSY Right 11/18/2017   INVASIVE MAMMARY CARCINOMA.   Marland Kitchen BREAST LUMPECTOMY Right 12/06/2017   invasive mammary carcinoma DCIS LCIS  with RAD  . COLONOSCOPY    . COLONOSCOPY WITH PROPOFOL N/A 11/15/2017   Procedure: COLONOSCOPY WITH PROPOFOL;  Surgeon: Lollie Sails, MD;  Location: [facility] ENDOSCOPY;  Service: Endoscopy;  Laterality: N/A;  . PARTIAL MASTECTOMY WITH NEEDLE LOCALIZATION Right 12/06/2017   Procedure: PARTIAL MASTECTOMY WITH NEEDLE LOCALIZATION;  Surgeon: Herbert Pun, MD;  Location: [facility] ORS;  Service: General;  Laterality: Right;  . SENTINEL NODE BIOPSY Right 12/06/2017   Procedure: SENTINEL NODE BIOPSY;  Surgeon: Herbert Pun, MD;  Location: [facility] ORS;  Service: General;  Laterality: Right;  . TUBAL LIGATION      There were no vitals filed for this visit.  Subjective Assessment - 01/15/20 1123    Subjective   I am  doing very well with the swelling under my arm -and the bras- I ordered another compression bra - and then cheaper one for every day wear to gives me more support and wider under the arm - but my hands still hurtss and stay swollen - and my middle finger on the R nearly locked on me over the weekend    Pertinent History  Pt had 12/06/17  R partial mastectomy with 2 axillary ln removed  and thenShe received radiation from 01/27/2018 - 03/03/2018.  She began Femara on 03/15/2018.  She switched to Aromasin on 06/09/2019 and has been back on Femara x 1 month.- seen NP last week and refer because of some swelling under R arm    Patient Stated Goals  Want to make sure the swelling do not get better and if I can get my hands better    Currently in Pain?  Yes    Pain Score  4     Pain Location  Hand    Pain Orientation  Right;Left    Pain Descriptors / Indicators  Tender    Pain Type  Acute pain;Chronic pain    Pain Onset  More than a month ago    Aggravating Factors   making fist and tender over A1pulley-L 2nd and R 3rd          LYMPHEDEMA/ONCOLOGY QUESTIONNAIRE - 01/15/20 1035  Right Upper Extremity Lymphedema   15 cm Proximal to Olecranon Process  31 cm    10 cm Proximal to Olecranon Process  28 cm    Olecranon Process  24 cm    15 cm Proximal to Ulnar Styloid Process  23 cm    Just Proximal to Ulnar Styloid Process  15 cm      Left Upper Extremity Lymphedema   15 cm Proximal to Olecranon Process  32 cm    10 cm Proximal to Olecranon Process  30 cm    Olecranon Process  23.8 cm    15 cm Proximal to Ulnar Styloid Process  22.8 cm    Just Proximal to Ulnar Styloid Process  15.3 cm         Pt wearing bra's with wider strap under arm -and no under wire - report feels better and not as swollen under arm - also has lymphedema bra that she wear - feels great - ordered another one  Circumference in  Bilateral Upper arms compare WNL   Hands still sore with gripping and some triggering  during day on R 3rd - tenderness on L 2nd  Did do contrast this past 2wks   And feels the isotoner gloves helped a lot  This date pt show some increase AROM - but still tender over A1 pulley of R hand 3rd and  L hand 2nd  Still increase swelling over MC's of bilateral hands   reinforce importance of contrast in combination of other HEP  Report pain with PROM to digits extention and decrease extention of digits - add AROM pain free after contrast  Prior to tendon glides - and not to force - only AROM gentle  And ice massage over A1pulley's during day   BUT NEED TO FOLLOW UP WITH PCP ABOUT REFERRAL TO RHEUMATHOLOGY  Reinforce and remind pt on joint protection principles - pt to contact if need to return in future                OT Education - 01/15/20 1125    Education Details  HEP    Person(s) Educated  Patient    Methods  Explanation;Demonstration;Tactile cues;Verbal cues;Handout    Comprehension  Verbal cues required;Returned demonstration;Verbalized understanding          OT Long Term Goals - 01/15/20 1128      OT LONG TERM GOAL #1   Title  Pt to wear new bra that provides compression for R thoracic edema with less tenderness    Baseline  symptoms improved - and independent in HEP    Status  Achieved      OT LONG TERM GOAL #2   Title  Pt report pain in bilateral hands decrease to less than 2/10 with fisting and grip - and less triggering    Baseline  pain cont to be 4-6/10 - tenderness over A1pulley R 3rd and L 2nd - refer back to PCP    Status  Deferred            Plan - 01/15/20 1125    Clinical Impression Statement  Pt show decrease edema/lymphedema under her R arm and no pain the last few wks - wearing better bra's proving compression - pt to wear as needed depending on her activities and symptoms - pt cont to have increase edema, pain in bilateral hands - did not imporve wiht HEP of contrast, isotoner glove - pt has follow up wiht PCP in 2wks -  discuss referral to rheumathology - ? CA drug per pt too - pt to contact me in future if need to return for hands - but for edema/lymphedema under R arm - met goals    OT Occupational Profile and History  Problem Focused Assessment - Including review of records relating to presenting problem    Occupational performance deficits (Please refer to evaluation for details):  ADL's;IADL's;Leisure;Play    Body Structure / Function / Physical Skills  Flexibility;UE functional use;Pain;Edema;IADL;ADL    Rehab Potential  Fair    Clinical Decision Making  Limited treatment options, no task modification necessary    Comorbidities Affecting Occupational Performance:  May have comorbidities impacting occupational performance    Modification or Assistance to Complete Evaluation   No modification of tasks or assist necessary to complete eval    OT Treatment/Interventions  Self-care/ADL training;Contrast Bath;DME and/or AE instruction;Manual Therapy;Patient/family education;Therapeutic exercise    Plan  cont at home and follow up with her MD's about her hands    OT Home Exercise Plan  see pt instruction    Consulted and Agree with Plan of Care  Patient       Patient will benefit from skilled therapeutic intervention in order to improve the following deficits and impairments:   Body Structure / Function / Physical Skills: Flexibility, UE functional use, Pain, Edema, IADL, ADL       Visit Diagnosis: Localized edema    Problem List Patient Active Problem List   Diagnosis Date Noted  . Hypercalcemia 03/20/2018  . Osteopenia of neck of left femur 12/20/2017  . Breast cancer, right ([place]) 11/18/2017    Jaxxson Cavanah, Gwenette Greet OTR/l,CLT 01/15/2020, 11:30 AM  [facility] [address] [place], [address] Phone: [phone]   Fax:  [phone]  Name: Brianna Rogers MRN: [id number] Date of Birth: 12-09-1945

## 2020-02-13 ENCOUNTER — Encounter: Payer: Self-pay | Admitting: Oncology

## 2020-03-02 ENCOUNTER — Encounter: Payer: Self-pay | Admitting: Oncology

## 2020-03-07 ENCOUNTER — Telehealth: Payer: Self-pay | Admitting: Oncology

## 2020-03-07 NOTE — Telephone Encounter (Signed)
Re: joint pain/hand and finger pain numbness  Mrs. Brianna Rogers has been off of her letrozole since 02/13/2020 due to progressive joint pain and hand and finger numbness.  No improvement noted off of her aromatase inhibitor.  Her PCP has referred her to rheumatology. She has also been working with [facility] rehab Brianna Rogers for swelling under her right arm which has dramatically improved.   We will touch base with Dr. Mike Gip to see if she should reinitiate her letrozole at this time.  Faythe Casa, NP 03/07/2020 3:40 PM

## 2020-04-04 ENCOUNTER — Encounter: Payer: Self-pay | Admitting: *Deleted

## 2020-04-26 ENCOUNTER — Ambulatory Visit: Payer: Medicare Other | Admitting: Radiation Oncology

## 2020-04-30 NOTE — Progress Notes (Signed)
[facility]  [address], [address] [place], [place] [address] Phone: [phone]  Fax: [phone]   Clinic Day:  05/01/2020  Referring physician: Sallee Lange, *  Chief Complaint: Brianna Rogers is a 74 y.o. female with stage IA right breast cancer and osteopenia who is seen for 5 month assessment and Prolia.   HPI: The patient was last seen in the medical oncology clinic on 11/29/2019 by Faythe Casa, NP. At that time, she felt "good".  Exam was stable. Hematocrit was 38.9, hemoglobin 13.3, platelets 213,000, WBC 7,100. Sodium was 133. Chloride was 97. Glucose was 284. BUN was 24. CA27.29 was 25.6. She was referred to the lymphedema clinic for right axillary swelling. She continued Femara.  Bone density on 12/18/2019 revealed osteopenia with a T-score of -1.4 at the left femur neck.  The patient contacted the clinic via [place] on 03/02/2020 stating that she had stopped taking Femara on 02/13/2020 due to hand pain/swelling/numbness. Her symptoms had not improved, so she was instructed to resume letrozole on 03/07/2020.  During the interim, she has been "good". She saw Rosalyn Gess, OT and switched to a lymphedema bra, which helped significantly. She performs monthly breast self exams. She had an episode of high blood sugar in the 500s. She is adjusting her medications and her blood sugar has improved. Denies any episodes of low sugars.  The numbness in her hands has been improving over the past month but she feels that it has plateaued. She saw Dr. Manuella Ghazi who did an EMG and found carpal tunnel in both hands. She is going to see a rheumatologist. She wears hand braces all the time. She states that this problem is "manageable" but she does not have full range of motion in her hands/fingers.  The patient went to the dentist 2 weeks ago and had no problems.   Past Medical History:  Diagnosis Date  . Breast cancer ([place])   . Breast cancer, right  ([place]) 11/18/2017  . Diabetes mellitus without complication ([place])   . Heart murmur   . Hypertension   . Lichen sclerosus et atrophicus   . Personal history of radiation therapy     Past Surgical History:  Procedure Laterality Date  . ABDOMINAL HYSTERECTOMY    . APPENDECTOMY    . BREAST BIOPSY Right 11/18/2017   INVASIVE MAMMARY CARCINOMA.   Marland Kitchen BREAST LUMPECTOMY Right 12/06/2017   invasive mammary carcinoma DCIS LCIS  with RAD  . COLONOSCOPY    . COLONOSCOPY WITH PROPOFOL N/A 11/15/2017   Procedure: COLONOSCOPY WITH PROPOFOL;  Surgeon: Lollie Sails, MD;  Location: [facility] ENDOSCOPY;  Service: Endoscopy;  Laterality: N/A;  . PARTIAL MASTECTOMY WITH NEEDLE LOCALIZATION Right 12/06/2017   Procedure: PARTIAL MASTECTOMY WITH NEEDLE LOCALIZATION;  Surgeon: Herbert Pun, MD;  Location: [facility] ORS;  Service: General;  Laterality: Right;  . SENTINEL NODE BIOPSY Right 12/06/2017   Procedure: SENTINEL NODE BIOPSY;  Surgeon: Herbert Pun, MD;  Location: [facility] ORS;  Service: General;  Laterality: Right;  . TUBAL LIGATION      Family History  Problem Relation Age of Onset  . Breast cancer Mother 52  . Cancer Mother   . Cancer Maternal Aunt   . Cancer Maternal Grandmother     Social History:  reports that she has never smoked. She has never used smokeless tobacco. She reports that she does not drink alcohol and does not use drugs.  She is an only child. Her mother passed away in 2019/02/03.  She no longer has to travel to [facility] to take care of her animals. She lives in [place]. Patient is retired from [employer]. The patient is alone today.  Allergies:  Allergies  Allergen Reactions  . Alendronate Itching  . Bactrim [Sulfamethoxazole-Trimethoprim] Other (See Comments)    Unknown  . Empagliflozin Itching  . Macrodantin [Nitrofurantoin Macrocrystal] Other (See Comments)    Unknown  . Sulfa Antibiotics Other (See Comments)    Flu like symptoms    Current  Medications: Current Outpatient Medications  Medication Sig Dispense Refill  . acetaminophen (TYLENOL) 500 MG tablet Take 500 mg by mouth every 6 (six) hours as needed for moderate pain or headache.    Marland Kitchen aspirin EC 81 MG tablet Take 81 mg by mouth every evening.     . B-D ULTRAFINE III SHORT PEN 31G X 8 MM MISC U UTD    . clobetasol ointment (TEMOVATE) 7.12 % Apply 1 application topically as needed.    . Continuous Blood Gluc Sensor (FREESTYLE LIBRE SENSOR SYSTEM) MISC Use 3 each every 10 (ten) days    . denosumab (PROLIA) 60 MG/ML SOSY injection Inject 60 mg into the skin every 6 (six) months.    . fluocinonide ointment (LIDEX) 0.05 % Apply topically. Apply topically daily. Apply to areas of rash daily x3 weeks per month; avoid use on face and skin folds    . LANTUS SOLOSTAR 100 UNIT/ML Solostar Pen 24 Units at bedtime.     Marland Kitchen letrozole (FEMARA) 2.5 MG tablet Take 1 tablet (2.5 mg total) by mouth daily. 30 tablet 0  . metFORMIN (GLUCOPHAGE) 1000 MG tablet Take 1,000 mg by mouth 2 (two) times daily with a meal.    . Multiple Vitamin (MULTIVITAMIN) tablet Take 1 tablet by mouth daily.    . Omega-3 Fatty Acids (FISH OIL) 1200 MG CAPS Take 1,200 mg by mouth 2 (two) times daily.     Marland Kitchen PREVIDENT 5000 DRY MOUTH 1.1 % GEL dental gel BRUSH WITH PASTE 2 TO 3 TIMES D. DO NOT RINSE.    Marland Kitchen quinapril (ACCUPRIL) 20 MG tablet Take 10 mg by mouth 2 (two) times daily at 10 AM and 5 PM.     . rosuvastatin (CRESTOR) 10 MG tablet Take 5 mg by mouth every other day.     . exemestane (AROMASIN) 25 MG tablet TAKE 1 TABLET BY MOUTH EVERY DAY AFTER BREAKFAST (Patient not taking: Reported on 05/01/2020) 90 tablet 1  . glimepiride (AMARYL) 4 MG tablet Take 4 mg by mouth 2 (two) times daily at 10 AM and 5 PM. 4 MG AM, 2 MG PM (Patient not taking: Reported on 05/01/2020)    . ibuprofen (ADVIL,MOTRIN) 200 MG tablet Take 200 mg by mouth every 6 (six) hours as needed for headache or moderate pain. (Patient not taking: Reported on  05/01/2020)    . imiquimod (ALDARA) 5 % cream Apply topically as needed.  (Patient not taking: Reported on 05/01/2020)    . nystatin (MYCOSTATIN/NYSTOP) powder Apply 1 application topically 3 (three) times daily. (Patient not taking: Reported on 05/01/2020) 15 g 0   No current facility-administered medications for this visit.    Review of Systems  Constitutional: Negative for chills, diaphoresis, fever, malaise/fatigue and weight loss (up 4 lbs).       Feels "good".  HENT: Negative.  Negative for congestion, ear pain, hearing loss, nosebleeds, sinus pain and sore throat.   Eyes: Negative for blurred vision, double vision and photophobia.  Respiratory: Negative.  Negative for cough, hemoptysis, sputum production and shortness of breath.   Cardiovascular: Negative.  Negative for chest pain, palpitations, orthopnea, leg swelling and PND.  Gastrointestinal: Negative.  Negative for abdominal pain, blood in stool, constipation, diarrhea, heartburn, melena, nausea and vomiting.  Genitourinary: Negative.  Negative for dysuria, frequency, hematuria and urgency.  Musculoskeletal: Positive for joint pain (B/L carpal tunnel). Negative for back pain, falls, myalgias and neck pain.  Skin: Negative.  Negative for itching and rash.  Neurological: Positive for sensory change (neuropathy in hands). Negative for dizziness, tingling, tremors, speech change, focal weakness, weakness and headaches.  Endo/Heme/Allergies: Does not bruise/bleed easily.       Diabetes.  Psychiatric/Behavioral: Negative.  Negative for depression and memory loss. The patient is not nervous/anxious and does not have insomnia.   All other systems reviewed and are negative.  Performance status (ECOG): 1  Vitals Blood pressure (!) 132/98, pulse 67, temperature (!) 97.2 F (36.2 C), temperature source Tympanic, resp. rate 18, height '4\' 11"'  (1.499 m), weight 158 lb 15.2 oz (72.1 kg), SpO2 100 %.  Physical Exam Vitals and nursing note  reviewed.  Constitutional:      General: She is not in acute distress.    Appearance: She is well-developed. She is not diaphoretic.  HENT:     Head: Normocephalic and atraumatic.     Mouth/Throat:     Mouth: Mucous membranes are dry.     Pharynx: Oropharynx is clear. No oropharyngeal exudate.  Eyes:     General: No scleral icterus.    Extraocular Movements: Extraocular movements intact.     Conjunctiva/sclera: Conjunctivae normal.     Pupils: Pupils are equal, round, and reactive to light.     Comments: Glasses.  Brown eyes.   Neck:     Vascular: No JVD.  Cardiovascular:     Rate and Rhythm: Normal rate and regular rhythm.     Heart sounds: Normal heart sounds. No murmur heard.   Pulmonary:     Effort: Pulmonary effort is normal. No respiratory distress.     Breath sounds: Normal breath sounds. No wheezing or rales.  Chest:     Chest wall: No tenderness.     Breasts:        Right: Swelling (inferior edema s/p radiation) and tenderness present. No inverted nipple, mass, nipple discharge or skin change.        Left: Skin change (rash, chronic; fibrocystic changes inferiorly) present. No inverted nipple, mass, nipple discharge or tenderness.  Abdominal:     General: Bowel sounds are normal. There is no distension.     Palpations: Abdomen is soft. There is no mass.     Tenderness: There is no abdominal tenderness. There is no guarding or rebound.  Musculoskeletal:        General: No swelling or tenderness.     Cervical back: Normal range of motion and neck supple.     Right lower leg: Edema present.     Left lower leg: Edema present.  Lymphadenopathy:     Head:     Right side of head: No preauricular, posterior auricular or occipital adenopathy.     Left side of head: No preauricular, posterior auricular or occipital adenopathy.     Cervical: No cervical adenopathy.     Upper Body:     Right upper body: No supraclavicular adenopathy.     Left upper body: No supraclavicular  adenopathy.     Lower Body: No right inguinal adenopathy. No left  inguinal adenopathy.  Skin:    General: Skin is warm and dry.     Coloration: Skin is not pale.     Findings: No erythema or rash.  Neurological:     Mental Status: She is alert and oriented to person, place, and time.  Psychiatric:        Behavior: Behavior normal.        Thought Content: Thought content normal.        Judgment: Judgment normal.    Appointment on 05/01/2020  Component Date Value Ref Range Status  . Sodium 05/01/2020 135  135 - 145 mmol/L Final  . Potassium 05/01/2020 4.3  3.5 - 5.1 mmol/L Final  . Chloride 05/01/2020 101  98 - 111 mmol/L Final  . CO2 05/01/2020 27  22 - 32 mmol/L Final  . Glucose, Bld 05/01/2020 256* 70 - 99 mg/dL Final   Glucose reference range applies only to samples taken after fasting for at least 8 hours.  . BUN 05/01/2020 24* 8 - 23 mg/dL Final  . Creatinine, Ser 05/01/2020 0.79  0.44 - 1.00 mg/dL Final  . Calcium 05/01/2020 9.7  8.9 - 10.3 mg/dL Final  . Total Protein 05/01/2020 6.7  6.5 - 8.1 g/dL Final  . Albumin 05/01/2020 3.6  3.5 - 5.0 g/dL Final  . AST 05/01/2020 16  15 - 41 U/L Final  . ALT 05/01/2020 19  0 - 44 U/L Final  . Alkaline Phosphatase 05/01/2020 44  38 - 126 U/L Final  . Total Bilirubin 05/01/2020 0.7  0.3 - 1.2 mg/dL Final  . GFR calc non Af Amer 05/01/2020 >60  >60 mL/min Final  . GFR calc Af Amer 05/01/2020 >60  >60 mL/min Final  . Anion gap 05/01/2020 7  5 - 15 Final   Performed at [facility], [address] [place], [place] [address]  . WBC 05/01/2020 7.7  4.0 - 10.5 K/uL Final  . RBC 05/01/2020 4.11  3.87 - 5.11 MIL/uL Final  . Hemoglobin 05/01/2020 12.5  12.0 - 15.0 g/dL Final  . HCT 05/01/2020 36.2  36 - 46 % Final  . MCV 05/01/2020 88.1  80.0 - 100.0 fL Final  . MCH 05/01/2020 30.4  26.0 - 34.0 pg Final  . MCHC 05/01/2020 34.5  30.0 - 36.0 g/dL Final  . RDW 05/01/2020 12.6  11.5 - 15.5 % Final  . Platelets 05/01/2020 210  150  - 400 K/uL Final  . nRBC 05/01/2020 0.0  0.0 - 0.2 % Final  . Neutrophils Relative % 05/01/2020 63  % Final  . Neutro Abs 05/01/2020 4.9  1.7 - 7.7 K/uL Final  . Lymphocytes Relative 05/01/2020 22  % Final  . Lymphs Abs 05/01/2020 1.7  0.7 - 4.0 K/uL Final  . Monocytes Relative 05/01/2020 8  % Final  . Monocytes Absolute 05/01/2020 0.6  0 - 1 K/uL Final  . Eosinophils Relative 05/01/2020 5  % Final  . Eosinophils Absolute 05/01/2020 0.4  0 - 0 K/uL Final  . Basophils Relative 05/01/2020 1  % Final  . Basophils Absolute 05/01/2020 0.1  0 - 0 K/uL Final  . Immature Granulocytes 05/01/2020 1  % Final  . Abs Immature Granulocytes 05/01/2020 0.04  0.00 - 0.07 K/uL Final   Performed at [facility], [address], [place], [place] [address]    Assessment:  Brianna Rogers is a 74 y.o. female with stage IA right breast cancers/plumpectomyon 12/06/2017. Pathology revealed a 1.7 cm  grade III invasive mammary carcinoma of no special type. There were biopsy site changes and a clip present. 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 Caudal margin re-excision revealed ductal and lobular carcinoma in situ. In situ carcinoma was <0.5 mm from the margin. Thermal artifact limited interpretation. New skin margin revealed in situ carcinoma <0.5 mm from the surgical margin. There was lymphovascular invasion. One sentinel lymph node was negative. Tumor was ER + (>90%), PR + (>90%) and Her2/neu 2+ (FISH negative). Pathologic stagewas pT1cN0.  Oncotype DX revealed a recurrence score of 9 (low risk). Distant recurrencce risk at 9 years with hormonal therapy was 3%. Absolute benefit of chemotherapy was <1%.  Diagnostic right mammogram and ultrasoundon 11/11/2017 revealed a suspicious mass in the 9 o'clock position of the right breast. Targeted ultrasound revealed a 1.5 x 2.2 x 1.9 cm mass with posterior acoustic shadowing 10 cm from the nipple. The right axilla was negative  for adenopathy. Bilateral mammogramon 10/27/2018 revealed no evidence of malignancy in either breast.  Bilateral diagnostic mammogram on 10/30/2019 revealed no evidence of malignancy with expected postsurgical and post radiation changes in the right breast.  She received radiationfrom 01/27/2018 - 03/03/2018. She began [facility] 03/15/2018.  She switched to Aromasin on 06/09/2019 and has been back on Femara.  She was off Femara from 02/13/2020 - 03/07/2020.  CA27.29 has been followed: 24.7 on 11/24/2017,22.5 on 03/15/2018, 16.0 on 10/31/2018, 22.6 on 04/05/2019, 17.4 on 08/01/2019, 21.7 on 11/02/2019, 25.6 on 11/29/2019 and 15.7 on 05/01/2020.  Bone densityon 12/09/2015 revealed osteopeniawith a T-score of -1.3 in the left hip. Bone densityon 12/14/2017 revealedosteopeniawith a T-score of -1.4 in the left femoral neck. Bone density on 12/18/2019 revealed osteopenia with a T-score of -1.4 at the left femur neck.  She began Proliaon 04/15/2018 (last 11/02/2019).  She has a family historyof breast cancer x 4 (mother and 3 paternal aunts) and "female cancer" (maternal great grandmother). [facility] genetic testingwas negative for BRCA1/2.  Symptomatically, she feels "good".  She is using a lymphedema bra, which has helped significantly.  She has carpal tunnel.  She denies any dental concerns.  Exam is stable.  Plan: 1.   Labs today: CBC with diff, CMP, CA 27.29. 2.   Stage IA RIGHT breast cancer Clinically, she continues to do well Exam reveals no evidence of recurrent disease.  CA27.29 remains normal. She is back on Femara and tolerating it well. Bilateral diagnostic mammogram on 10/30/2019 revealed no evidence of malignancy. Continue surveillance. 3.   Osteopenia Patient last received Prolia on 11/02/2019. Continue Prolia every 6 months. Recent dental exam was unremarkable. Review interval bone density on 12/18/2019.  Osteopenia is stable.  Continue calcium with vitamin  D. 4.   Prolia today. 5.   Bilateral mammogram on 10/30/2020. 6.   RTC in 6 months for MD assessment, labs (CBC with diff, CMP, CA27.29), review of mammogram and Prolia.  I discussed the assessment and treatment plan with the patient.  The patient was provided an opportunity to ask questions and all were answered.  The patient agreed with the plan and demonstrated an understanding of the instructions.  The patient was advised to call back if the symptoms worsen or if the condition fails to improve as anticipated.   Lequita Asal, MD, PhD    05/01/2020, 2:25 PM  I, Mirian Mo Tufford, am acting as Education administrator for [employer]. Mike Gip, MD, PhD.  I, Imojean Yoshino C. Mike Gip, MD, have reviewed the above documentation for accuracy and completeness, and  I agree with the above.

## 2020-05-01 ENCOUNTER — Other Ambulatory Visit: Payer: Self-pay

## 2020-05-01 ENCOUNTER — Inpatient Hospital Stay: Payer: Medicare Other

## 2020-05-01 ENCOUNTER — Inpatient Hospital Stay: Payer: Medicare Other | Attending: Hematology and Oncology | Admitting: Hematology and Oncology

## 2020-05-01 ENCOUNTER — Encounter: Payer: Self-pay | Admitting: Hematology and Oncology

## 2020-05-01 VITALS — BP 132/98 | HR 67 | Temp 97.2°F | Resp 18 | Ht 59.0 in | Wt 159.0 lb

## 2020-05-01 VITALS — BP 166/88

## 2020-05-01 DIAGNOSIS — M85852 Other specified disorders of bone density and structure, left thigh: Secondary | ICD-10-CM | POA: Insufficient documentation

## 2020-05-01 DIAGNOSIS — Z170 Estrogen receptor positive status [ER+]: Secondary | ICD-10-CM | POA: Insufficient documentation

## 2020-05-01 DIAGNOSIS — Z79811 Long term (current) use of aromatase inhibitors: Secondary | ICD-10-CM | POA: Insufficient documentation

## 2020-05-01 DIAGNOSIS — C50411 Malignant neoplasm of upper-outer quadrant of right female breast: Secondary | ICD-10-CM

## 2020-05-01 DIAGNOSIS — C50911 Malignant neoplasm of unspecified site of right female breast: Secondary | ICD-10-CM | POA: Diagnosis not present

## 2020-05-01 LAB — CBC WITH DIFFERENTIAL/PLATELET
Abs Immature Granulocytes: 0.04 10*3/uL (ref 0.00–0.07)
Basophils Absolute: 0.1 10*3/uL (ref 0.0–0.1)
Basophils Relative: 1 %
Eosinophils Absolute: 0.4 10*3/uL (ref 0.0–0.5)
Eosinophils Relative: 5 %
HCT: 36.2 % (ref 36.0–46.0)
Hemoglobin: 12.5 g/dL (ref 12.0–15.0)
Immature Granulocytes: 1 %
Lymphocytes Relative: 22 %
Lymphs Abs: 1.7 10*3/uL (ref 0.7–4.0)
MCH: 30.4 pg (ref 26.0–34.0)
MCHC: 34.5 g/dL (ref 30.0–36.0)
MCV: 88.1 fL (ref 80.0–100.0)
Monocytes Absolute: 0.6 10*3/uL (ref 0.1–1.0)
Monocytes Relative: 8 %
Neutro Abs: 4.9 10*3/uL (ref 1.7–7.7)
Neutrophils Relative %: 63 %
Platelets: 210 10*3/uL (ref 150–400)
RBC: 4.11 MIL/uL (ref 3.87–5.11)
RDW: 12.6 % (ref 11.5–15.5)
WBC: 7.7 10*3/uL (ref 4.0–10.5)
nRBC: 0 % (ref 0.0–0.2)

## 2020-05-01 LAB — COMPREHENSIVE METABOLIC PANEL
ALT: 19 U/L (ref 0–44)
AST: 16 U/L (ref 15–41)
Albumin: 3.6 g/dL (ref 3.5–5.0)
Alkaline Phosphatase: 44 U/L (ref 38–126)
Anion gap: 7 (ref 5–15)
BUN: 24 mg/dL — ABNORMAL HIGH (ref 8–23)
CO2: 27 mmol/L (ref 22–32)
Calcium: 9.7 mg/dL (ref 8.9–10.3)
Chloride: 101 mmol/L (ref 98–111)
Creatinine, Ser: 0.79 mg/dL (ref 0.44–1.00)
GFR calc Af Amer: >60 mL/min (ref >60)
GFR calc non Af Amer: >60 mL/min (ref >60)
Glucose, Bld: 256 mg/dL — ABNORMAL HIGH (ref 70–99)
Potassium: 4.3 mmol/L (ref 3.5–5.1)
Sodium: 135 mmol/L (ref 135–145)
Total Bilirubin: 0.7 mg/dL (ref 0.3–1.2)
Total Protein: 6.7 g/dL (ref 6.5–8.1)

## 2020-05-01 MED ORDER — DENOSUMAB 60 MG/ML ~~LOC~~ SOSY
60.0000 mg | PREFILLED_SYRINGE | Freq: Once | SUBCUTANEOUS | Status: AC
  Administered 2020-05-01: 60 mg via SUBCUTANEOUS
  Filled 2020-05-01: qty 1

## 2020-05-01 MED ORDER — SODIUM CHLORIDE 0.9 % IV SOLN
Freq: Once | INTRAVENOUS | Status: DC
  Filled 2020-05-01: qty 250

## 2020-05-02 LAB — CANCER ANTIGEN 27.29: CA 27.29: 15.7 U/mL (ref 0.0–38.6)

## 2020-05-23 ENCOUNTER — Ambulatory Visit: Payer: Medicare Other | Admitting: Radiation Oncology

## 2020-06-12 ENCOUNTER — Other Ambulatory Visit: Payer: Self-pay

## 2020-06-12 ENCOUNTER — Encounter: Payer: Self-pay | Admitting: Radiation Oncology

## 2020-06-12 ENCOUNTER — Ambulatory Visit
Admission: RE | Admit: 2020-06-12 | Discharge: 2020-06-12 | Disposition: A | Discharge status: Home / Self Care | Payer: Medicare Other | Source: Ambulatory Visit | Attending: Radiation Oncology | Admitting: Radiation Oncology

## 2020-06-12 VITALS — BP 166/81 | HR 64 | Temp 97.4°F | Wt 159.0 lb

## 2020-06-12 DIAGNOSIS — Z923 Personal history of irradiation: Secondary | ICD-10-CM | POA: Insufficient documentation

## 2020-06-12 DIAGNOSIS — C50411 Malignant neoplasm of upper-outer quadrant of right female breast: Secondary | ICD-10-CM | POA: Diagnosis not present

## 2020-06-12 DIAGNOSIS — Z79811 Long term (current) use of aromatase inhibitors: Secondary | ICD-10-CM | POA: Diagnosis not present

## 2020-06-12 DIAGNOSIS — Z170 Estrogen receptor positive status [ER+]: Secondary | ICD-10-CM | POA: Diagnosis not present

## 2020-06-12 NOTE — Progress Notes (Signed)
Radiation Oncology Follow up Note  Name: Brianna Rogers   Date:   06/12/2020 MRN:  [id number] DOB: Nov 01, 1945    This 74 y.o. female presents to the clinic today for 2-year follow-up status post whole breast radiation to her right breast for stage I ER/PR positive invasive mammary carcinoma.  REFERRING PROVIDER: Sallee Lange, *  HPI: Patient is a 74 year old female now out 2 years having completed whole breast radiation to her right breast for stage I ER/PR positive invasive mammary carcinoma seen today in routine follow-up she is doing well.  She specifically denies breast tenderness cough or bone pain..  She had mammograms back in February which I have reviewed were BI-RADS 2 benign.  She is currently on Femara tolerating it well without side effect.  COMPLICATIONS OF TREATMENT: none  FOLLOW UP COMPLIANCE: keeps appointments   PHYSICAL EXAM:  BP (!) 166/81 (BP Location: Left Arm, Patient Position: Sitting, Cuff Size: Normal)   Pulse 64   Temp (!) 97.4 F (36.3 C) (Tympanic)   Wt 159 lb (72.1 kg)   BMI 32.11 kg/m  Right breast is noticeably smaller than the left breast although cosmetic result is still excellent no dominant mass or nodularity is noted in either breast.  No axillary or supraclavicular adenopathy is appreciated.  Well-developed well-nourished patient in NAD. HEENT reveals PERLA, EOMI, discs not visualized.  Oral cavity is clear. No oral mucosal lesions are identified. Neck is clear without evidence of cervical or supraclavicular adenopathy. Lungs are clear to A&P. Cardiac examination is essentially unremarkable with regular rate and rhythm without murmur rub or thrill. Abdomen is benign with no organomegaly or masses noted. Motor sensory and DTR levels are equal and symmetric in the upper and lower extremities. Cranial nerves II through XII are grossly intact. Proprioception is intact. No peripheral adenopathy or edema is identified. No motor or sensory levels are  noted. Crude visual fields are within normal range.  RADIOLOGY RESULTS: Mammograms reviewed compatible with above-stated findings  PLAN: Present time patient is doing well now at 2 years with no evidence of disease.  She continues on Femara without side effect.  I have asked to see her back in 1 year for follow-up.  She is already scheduled for follow-up mammograms in February.  Patient knows to call with any concerns.  I would like to take this opportunity to thank you for allowing me to participate in the care of your patient.Noreene Filbert, MD

## 2020-07-22 DIAGNOSIS — G5601 Carpal tunnel syndrome, right upper limb: Secondary | ICD-10-CM | POA: Insufficient documentation

## 2020-07-24 ENCOUNTER — Other Ambulatory Visit: Payer: Self-pay | Admitting: Surgery

## 2020-08-05 ENCOUNTER — Encounter
Admission: RE | Admit: 2020-08-05 | Discharge: 2020-08-05 | Disposition: A | Discharge status: Home / Self Care | Payer: Medicare Other | Source: Ambulatory Visit | Attending: Surgery | Admitting: Surgery

## 2020-08-05 ENCOUNTER — Other Ambulatory Visit: Payer: Self-pay

## 2020-08-05 NOTE — Patient Instructions (Signed)
Your procedure is scheduled on: August 13, 2020 Tuesday  Report to the Registration Desk on the 1st floor of the [facility]. To find out your arrival time, please call [phone] between 1PM - 3PM on: August 12, 2020  REMEMBER: Instructions that are not followed completely may result in serious medical risk, up to and including death; or upon the discretion of your surgeon and anesthesiologist your surgery may need to be rescheduled.  Do not eat food after midnight the night before surgery.  No gum chewing, lozengers or hard candies.  You may however, drink CLEAR liquids up to 2 hours before you are scheduled to arrive for your surgery. Do not drink anything within 2 hours of your scheduled arrival time.  Clear liquids include: - water   Do NOT drink anything that is not on this list.  Type 1 and Type 2 diabetics should only drink water.  In addition, your doctor has ordered for you to drink the provided  Gatorade G2 Drinking this carbohydrate drink up to two hours before surgery helps to reduce insulin resistance and improve patient outcomes. Please complete drinking 2 hours prior to scheduled arrival time.  TAKE THESE MEDICATIONS THE MORNING OF SURGERY WITH A SIP OF WATER: NONE  Use inhalers on the day of surgery and bring to the hospital.  Stop Metformin 2 days prior to surgery. LAST DOSE August 10, 2020  NO INSULIN on the morning of surgery.  Follow recommendations from Cardiologist, Pulmonologist or PCP regarding stopping Aspirin, Coumadin, Plavix, Eliquis, Pradaxa, or Pletal. LAST DOSE 08/08/2020  One week prior to surgery: Stop Anti-inflammatories (NSAIDS) such as Advil, Aleve, Ibuprofen, Motrin, Naproxen, Naprosyn and Aspirin based products such as Excedrin, Goodys Powder, BC Powder, Stop ANY OVER THE COUNTER supplements until after surgery. USE TYLENOL ONLY. (However, you may continue taking Vitamin D, Vitamin B, and multivitamin up until the day before  surgery.) STOP MELOXICAM AND FISH OIL AS OF TODAY.  No Alcohol for 24 hours before or after surgery.  No Smoking including e-cigarettes for 24 hours prior to surgery.  No chewable tobacco products for at least 6 hours prior to surgery.  No nicotine patches on the day of surgery.  Do not use any "recreational" drugs for at least a week prior to your surgery.  Please be advised that the combination of cocaine and anesthesia may have negative outcomes, up to and including death. If you test positive for cocaine, your surgery will be cancelled.  On the morning of surgery brush your teeth with toothpaste and water, you may rinse your mouth with mouthwash if you wish. Do not swallow any toothpaste or mouthwash.  Do not wear jewelry, make-up, hairpins, clips or nail polish.  Do not wear lotions, powders, or perfumes NO DEODORANT.   Do not shave body from the neck down 48 hours prior to surgery just in case you cut yourself which could leave a site for infection.  Also, freshly shaved skin may become irritated if using the CHG soap.  Contact lenses, hearing aids and dentures may not be worn into surgery.  Do not bring valuables to the hospital. [facility] is not responsible for any missing/lost belongings or valuables.   Use CHG Soap as directed on instruction sheet.  Notify your doctor if there is any change in your medical condition (cold, fever, infection).  Wear comfortable clothing (specific to your surgery type) to the hospital.  Plan for stool softeners for home use; pain medications have a tendency  to cause constipation. You can also help prevent constipation by eating foods high in fiber such as fruits and vegetables and drinking plenty of fluids as your diet allows.  After surgery, you can help prevent lung complications by doing breathing exercises.  Take deep breaths and cough every 1-2 hours. Your doctor may order a device called an Incentive Spirometer to help you take deep  breaths. When coughing or sneezing, hold a pillow firmly against your incision with both hands. This is called "splinting." Doing this helps protect your incision. It also decreases belly discomfort.  If you are being discharged the day of surgery, you will not be allowed to drive home. You will need a responsible adult (18 years or older) to drive you home and stay with you that night.   Please call the [facility] at [phone] if you have any questions about these instructions.  Visitation Policy:  Patients undergoing a surgery or procedure may have one family member or support person with them as long as that person is not COVID-19 positive or experiencing its symptoms.  That person may remain in the waiting area during the procedure.  Inpatient Visitation Update:   In an effort to ensure the safety of our team members and our patients, we are implementing a change to our visitation policy:  Effective Monday, Aug. 9, at 7 a.m., inpatients will be allowed one support person.  o The support person may change daily.  o The support person must pass our screening, gel in and out, and wear a mask at all times, including in the patient's room.  o Patients must also wear a mask when staff or their support person are in the room.  o Masking is required regardless of vaccination status.  Systemwide, no visitors 17 or younger.

## 2020-08-09 ENCOUNTER — Other Ambulatory Visit
Admission: RE | Admit: 2020-08-09 | Discharge: 2020-08-09 | Disposition: A | Discharge status: Home / Self Care | Payer: Medicare Other | Source: Ambulatory Visit | Attending: Surgery | Admitting: Surgery

## 2020-08-09 ENCOUNTER — Other Ambulatory Visit: Payer: Self-pay

## 2020-08-09 DIAGNOSIS — Z01818 Encounter for other preprocedural examination: Secondary | ICD-10-CM | POA: Insufficient documentation

## 2020-08-09 DIAGNOSIS — Z20822 Contact with and (suspected) exposure to covid-19: Secondary | ICD-10-CM | POA: Diagnosis not present

## 2020-08-09 DIAGNOSIS — I10 Essential (primary) hypertension: Secondary | ICD-10-CM | POA: Insufficient documentation

## 2020-08-10 LAB — SARS CORONAVIRUS 2 (TAT 6-24 HRS): SARS Coronavirus 2: NEGATIVE

## 2020-08-13 ENCOUNTER — Ambulatory Visit
Admission: RE | Admit: 2020-08-13 | Discharge: 2020-08-13 | Disposition: A | Discharge status: Home / Self Care | Payer: Medicare Other | Attending: Surgery | Admitting: Surgery

## 2020-08-13 ENCOUNTER — Ambulatory Visit: Payer: Medicare Other | Admitting: Anesthesiology

## 2020-08-13 ENCOUNTER — Encounter
Admission: RE | Disposition: A | Discharge status: Home / Self Care | Payer: Self-pay | Source: Home / Self Care | Attending: Surgery

## 2020-08-13 ENCOUNTER — Encounter: Payer: Self-pay | Admitting: Surgery

## 2020-08-13 ENCOUNTER — Other Ambulatory Visit: Payer: Self-pay

## 2020-08-13 DIAGNOSIS — Z882 Allergy status to sulfonamides status: Secondary | ICD-10-CM | POA: Diagnosis not present

## 2020-08-13 DIAGNOSIS — M65331 Trigger finger, right middle finger: Secondary | ICD-10-CM | POA: Diagnosis not present

## 2020-08-13 DIAGNOSIS — Z888 Allergy status to other drugs, medicaments and biological substances status: Secondary | ICD-10-CM | POA: Insufficient documentation

## 2020-08-13 DIAGNOSIS — G5601 Carpal tunnel syndrome, right upper limb: Secondary | ICD-10-CM | POA: Diagnosis present

## 2020-08-13 DIAGNOSIS — Z7982 Long term (current) use of aspirin: Secondary | ICD-10-CM | POA: Diagnosis not present

## 2020-08-13 DIAGNOSIS — Z791 Long term (current) use of non-steroidal anti-inflammatories (NSAID): Secondary | ICD-10-CM | POA: Insufficient documentation

## 2020-08-13 DIAGNOSIS — R202 Paresthesia of skin: Secondary | ICD-10-CM | POA: Insufficient documentation

## 2020-08-13 DIAGNOSIS — Z79899 Other long term (current) drug therapy: Secondary | ICD-10-CM | POA: Diagnosis not present

## 2020-08-13 DIAGNOSIS — Z794 Long term (current) use of insulin: Secondary | ICD-10-CM | POA: Diagnosis not present

## 2020-08-13 HISTORY — PX: CARPAL TUNNEL RELEASE: SHX101

## 2020-08-13 LAB — GLUCOSE, CAPILLARY
Glucose-Capillary: 287 mg/dL — ABNORMAL HIGH (ref 70–99)
Glucose-Capillary: 251 mg/dL — ABNORMAL HIGH (ref 70–99)

## 2020-08-13 SURGERY — RELEASE, CARPAL TUNNEL, ENDOSCOPIC
Anesthesia: General | Site: Wrist | Laterality: Right

## 2020-08-13 MED ORDER — METOCLOPRAMIDE HCL 5 MG/ML IJ SOLN: 5.0000 mg | Freq: Three times a day (TID) | INTRAMUSCULAR | Status: DC | PRN

## 2020-08-13 MED ORDER — POTASSIUM CHLORIDE IN NACL 20-0.9 MEQ/L-% IV SOLN
INTRAVENOUS | Status: DC
  Filled 2020-08-13: qty 1000

## 2020-08-13 MED ORDER — FENTANYL CITRATE (PF) 100 MCG/2ML IJ SOLN
INTRAMUSCULAR | Status: DC | PRN
  Administered 2020-08-13: 25 ug via INTRAVENOUS
  Administered 2020-08-13: 50 ug via INTRAVENOUS
  Administered 2020-08-13: 25 ug via INTRAVENOUS

## 2020-08-13 MED ORDER — KETOROLAC TROMETHAMINE 30 MG/ML IJ SOLN
INTRAMUSCULAR | Status: DC | PRN
  Administered 2020-08-13: 15 mg via INTRAVENOUS

## 2020-08-13 MED ORDER — ONDANSETRON HCL 4 MG/2ML IJ SOLN
INTRAMUSCULAR | Status: AC
  Filled 2020-08-13: qty 2

## 2020-08-13 MED ORDER — LIDOCAINE HCL (PF) 2 % IJ SOLN
INTRAMUSCULAR | Status: AC
  Filled 2020-08-13: qty 5

## 2020-08-13 MED ORDER — CHLORHEXIDINE GLUCONATE 0.12 % MT SOLN
OROMUCOSAL | Status: AC
  Administered 2020-08-13: 15 mL via OROMUCOSAL
  Filled 2020-08-13: qty 15

## 2020-08-13 MED ORDER — ACETAMINOPHEN 10 MG/ML IV SOLN
INTRAVENOUS | Status: AC
  Filled 2020-08-13: qty 100

## 2020-08-13 MED ORDER — ONDANSETRON HCL 4 MG/2ML IJ SOLN: 4.0000 mg | Freq: Four times a day (QID) | INTRAMUSCULAR | Status: DC | PRN

## 2020-08-13 MED ORDER — CEFAZOLIN SODIUM-DEXTROSE 2-4 GM/100ML-% IV SOLN
INTRAVENOUS | Status: AC
  Filled 2020-08-13: qty 100

## 2020-08-13 MED ORDER — TRAMADOL HCL 50 MG PO TABS: 50.0000 mg | ORAL_TABLET | Freq: Four times a day (QID) | ORAL | Status: DC | PRN

## 2020-08-13 MED ORDER — ONDANSETRON HCL 4 MG PO TABS: 4.0000 mg | ORAL_TABLET | Freq: Four times a day (QID) | ORAL | Status: DC | PRN

## 2020-08-13 MED ORDER — BUPIVACAINE HCL (PF) 0.5 % IJ SOLN
INTRAMUSCULAR | Status: AC
  Filled 2020-08-13: qty 30

## 2020-08-13 MED ORDER — FENTANYL CITRATE (PF) 100 MCG/2ML IJ SOLN: 25.0000 ug | INTRAMUSCULAR | Status: DC | PRN

## 2020-08-13 MED ORDER — ACETAMINOPHEN 10 MG/ML IV SOLN
INTRAVENOUS | Status: DC | PRN
  Administered 2020-08-13: 1000 mg via INTRAVENOUS

## 2020-08-13 MED ORDER — ORAL CARE MOUTH RINSE: 15.0000 mL | Freq: Once | OROMUCOSAL | Status: AC

## 2020-08-13 MED ORDER — CHLORHEXIDINE GLUCONATE 0.12 % MT SOLN: 15.0000 mL | Freq: Once | OROMUCOSAL | Status: AC

## 2020-08-13 MED ORDER — EPHEDRINE 5 MG/ML INJ
INTRAVENOUS | Status: AC
  Filled 2020-08-13: qty 10

## 2020-08-13 MED ORDER — CEFAZOLIN SODIUM-DEXTROSE 2-4 GM/100ML-% IV SOLN
2.0000 g | INTRAVENOUS | Status: AC
  Administered 2020-08-13: 2 g via INTRAVENOUS

## 2020-08-13 MED ORDER — SODIUM CHLORIDE 0.9 % IV SOLN: INTRAVENOUS | Status: DC

## 2020-08-13 MED ORDER — ONDANSETRON HCL 4 MG/2ML IJ SOLN
INTRAMUSCULAR | Status: DC | PRN
  Administered 2020-08-13: 4 mg via INTRAVENOUS

## 2020-08-13 MED ORDER — METOCLOPRAMIDE HCL 10 MG PO TABS: 5.0000 mg | ORAL_TABLET | Freq: Three times a day (TID) | ORAL | Status: DC | PRN

## 2020-08-13 MED ORDER — TRAMADOL HCL 50 MG PO TABS
50.0000 mg | ORAL_TABLET | Freq: Four times a day (QID) | ORAL | 0 refills | Status: DC | PRN
Start: 2020-08-13 — End: 2021-02-04

## 2020-08-13 MED ORDER — FENTANYL CITRATE (PF) 100 MCG/2ML IJ SOLN
INTRAMUSCULAR | Status: AC
  Filled 2020-08-13: qty 2

## 2020-08-13 MED ORDER — ONDANSETRON HCL 4 MG/2ML IJ SOLN
4.0000 mg | Freq: Once | INTRAMUSCULAR | Status: AC | PRN
  Administered 2020-08-13: 4 mg via INTRAVENOUS

## 2020-08-13 MED ORDER — BUPIVACAINE HCL (PF) 0.5 % IJ SOLN
INTRAMUSCULAR | Status: DC | PRN
  Administered 2020-08-13: 15 mL

## 2020-08-13 MED ORDER — FAMOTIDINE 20 MG PO TABS: 20.0000 mg | ORAL_TABLET | Freq: Once | ORAL | Status: AC

## 2020-08-13 MED ORDER — FAMOTIDINE 20 MG PO TABS
ORAL_TABLET | ORAL | Status: AC
  Administered 2020-08-13: 20 mg via ORAL
  Filled 2020-08-13: qty 1

## 2020-08-13 MED ORDER — LIDOCAINE HCL (CARDIAC) PF 100 MG/5ML IV SOSY
PREFILLED_SYRINGE | INTRAVENOUS | Status: DC | PRN
  Administered 2020-08-13: 70 mg via INTRAVENOUS

## 2020-08-13 MED ORDER — PROPOFOL 10 MG/ML IV BOLUS
INTRAVENOUS | Status: DC | PRN
  Administered 2020-08-13: 150 mg via INTRAVENOUS

## 2020-08-13 MED ORDER — DEXAMETHASONE SODIUM PHOSPHATE 10 MG/ML IJ SOLN
INTRAMUSCULAR | Status: DC | PRN
  Administered 2020-08-13: 4 mg via INTRAVENOUS

## 2020-08-13 SURGICAL SUPPLY — 32 items
APL PRP STRL LF DISP 70% ISPRP (MISCELLANEOUS) ×1
BNDG COHESIVE 4X5 TAN STRL (GAUZE/BANDAGES/DRESSINGS) ×3 IMPLANT
BNDG ELASTIC 2X5.8 VLCR STR LF (GAUZE/BANDAGES/DRESSINGS) ×3 IMPLANT
BNDG ESMARK 4X12 TAN STRL LF (GAUZE/BANDAGES/DRESSINGS) ×3 IMPLANT
CHLORAPREP W/TINT 26 (MISCELLANEOUS) ×3 IMPLANT
CORD BIP STRL DISP 12FT (MISCELLANEOUS) ×3 IMPLANT
COVER WAND RF STERILE (DRAPES) ×3 IMPLANT
CUFF TOURN SGL QUICK 18X4 (TOURNIQUET CUFF) ×3 IMPLANT
DRAPE SURG 17X11 SM STRL (DRAPES) ×3 IMPLANT
FORCEPS JEWEL BIP 4-3/4 STR (INSTRUMENTS) ×3 IMPLANT
GAUZE SPONGE 4X4 12PLY STRL (GAUZE/BANDAGES/DRESSINGS) ×3 IMPLANT
GAUZE XEROFORM 1X8 LF (GAUZE/BANDAGES/DRESSINGS) ×3 IMPLANT
GLOVE BIO SURGEON STRL SZ8 (GLOVE) ×3 IMPLANT
GLOVE INDICATOR 8.0 STRL GRN (GLOVE) ×3 IMPLANT
GOWN STRL REUS W/ TWL LRG LVL3 (GOWN DISPOSABLE) ×1 IMPLANT
GOWN STRL REUS W/ TWL XL LVL3 (GOWN DISPOSABLE) ×1 IMPLANT
GOWN STRL REUS W/TWL LRG LVL3 (GOWN DISPOSABLE) ×3
GOWN STRL REUS W/TWL XL LVL3 (GOWN DISPOSABLE) ×3
KIT CARPAL TUNNEL (MISCELLANEOUS) ×3
KIT ESCP INSRT D SLOT CANN KN (MISCELLANEOUS) ×1 IMPLANT
KIT TURNOVER KIT A (KITS) ×3 IMPLANT
MANIFOLD NEPTUNE II (INSTRUMENTS) ×3 IMPLANT
NS IRRIG 500ML POUR BTL (IV SOLUTION) ×3 IMPLANT
PACK EXTREMITY (MISCELLANEOUS) ×3 IMPLANT
SPLINT WRIST LG LT TX990309 (SOFTGOODS) IMPLANT
SPLINT WRIST LG RT TX900304 (SOFTGOODS) ×3 IMPLANT
SPLINT WRIST M LT TX990308 (SOFTGOODS) IMPLANT
SPLINT WRIST M RT TX990303 (SOFTGOODS) IMPLANT
SPLINT WRIST XL LT TX990310 (SOFTGOODS) IMPLANT
SPLINT WRIST XL RT TX990305 (SOFTGOODS) IMPLANT
STOCKINETTE IMPERVIOUS 9X36 MD (GAUZE/BANDAGES/DRESSINGS) ×3 IMPLANT
SUT PROLENE 4 0 PS 2 18 (SUTURE) ×3 IMPLANT

## 2020-08-13 NOTE — OR Nursing (Signed)
Per Dr. Roland Rack secure chat - patient may resume aspirin tomorrow and mobic tonight.  Added to d/c instructions/med section.

## 2020-08-13 NOTE — Anesthesia Procedure Notes (Signed)
Procedure Name: LMA Insertion Date/Time: 08/13/2020 1:26 PM Performed by: Lowry Bowl, CRNA Pre-anesthesia Checklist: Emergency Drugs available, Suction available, Patient being monitored and Patient identified Patient Re-evaluated:Patient Re-evaluated prior to induction Oxygen Delivery Method: Circle system utilized Preoxygenation: Pre-oxygenation with 100% oxygen Induction Type: IV induction LMA: LMA inserted LMA Size: 3.0 Number of attempts: 1 Placement Confirmation: positive ETCO2 and breath sounds checked- equal and bilateral Tube secured with: Tape Dental Injury: Teeth and Oropharynx as per pre-operative assessment

## 2020-08-13 NOTE — Op Note (Signed)
08/13/2020  2:21 PM  Patient:   Brianna Rogers  Pre-Op Diagnosis:   1.  Right carpal tunnel syndrome.  2.  Right long trigger finger.  Post-Op Diagnosis:   Same  Procedure:   1.  Endoscopic right carpal tunnel release.  2.  Release right long trigger finger.  Surgeon:   Pascal Lux, MD  Assistant:   Sherlene Shams, PA-S  Anesthesia:   General LMA  Findings:   As above.  Complications:   None  EBL:   0 cc  Fluids:   500 cc crystalloid  TT:   26 minutes at 250 mmHg  Drains:   None  Closure:   4-0 Prolene interrupted sutures  Brief Clinical Note:   The patient is a 74 year old female with a long history of progressive worsening pain and paresthesias to her right hand. Her symptoms have progressed despite medications, activity modification, etc. Her history and examination are consistent with carpal tunnel syndrome. A preoperative EMG also confirmed the presence of carpal tunnel syndrome. The patient presents at this time for an endoscopic right carpal tunnel release.  The patient also notes history of painful catching of her right long finger. The symptoms have been present for several months but appear to be worsening recently. Her history and examination are consistent with a right long trigger finger. The patient presents at this time for release of the right long trigger finger as well.  Procedure:   The patient was brought into the operating room and lain in the supine position. After adequate general laryngeal mask anesthesia was obtained, the right hand and upper extremity were prepped with ChloraPrep solution before being draped sterilely. Preoperative antibiotics were administered. A timeout was performed to verify the appropriate surgical site before the limb was exsanguinated with an Esmarch and the tourniquet inflated to 250 mmHg.   The carpal tunnel release procedure was performed first. An approximately 1.5-2 cm incision was made over the volar wrist flexion crease,  centered over the palmaris longus tendon. The incision was carried down through the subcutaneous tissues with care taken to identify and protect any neurovascular structures. The distal forearm fascia was penetrated just proximal to the transverse carpal ligament. The soft tissues were released off the superficial and deep surfaces of the distal forearm fascia and this was released proximally for 3-4 cm under direct visualization.  Attention was directed distally. The Soil scientist was passed beneath the transverse carpal ligament along the ulnar aspect of the carpal tunnel and used to release any adhesions as well as to remove any adherent synovial tissue before first the smaller then the larger of the two dilators were passed beneath the transverse carpal ligament along the ulnar margin of the carpal tunnel. The slotted cannula was introduced and the endoscope was placed into the slotted cannula and the undersurface of the transverse carpal ligament visualized. The distal margin of the transverse carpal ligament was marked by placing a 25-gauge needle percutaneously at [place] cardinal point so that it entered the distal portion of the slotted cannula. Under endoscopic visualization, the transverse carpal ligament was released from proximal to distal using the end-cutting blade. A second pass was performed to ensure complete release of the ligament. The adequacy of release was verified both endoscopically and by palpation using the freer elevator.  Next, the right long trigger finger was addressed. An approximately 1.5-2.0 cm incision was made over the volar aspect of the right long finger at the level of the metacarpal head centered  over the flexor sheath, utilizing one of her skin creases in the area. The incision was carried down through the subcutaneous tissues with care taken to identify and protect the digital neurovascular structures. The flexor sheath was entered just proximal to the A1 pulley. The  sheath was released proximally for several centimeters under direct visualization. Distally, a clamp was placed beneath the A1 pulley and used to release any adhesions. The clamp was repositioned so that one jaw was superficial to and the other jaw deep to the A1 pulley. The A1 pulley was incised on either side of the clamp to remove a 2 mm strip of tissue. Metzenbaum scissors were used to ensure complete release of the A1 pulley more distally. The underlying tendons were carefully inspected and found to be intact.  Each wound was irrigated thoroughly with sterile saline solution before being closed using 4-0 Prolene interrupted sutures. A total of 15 cc of 0.5% plain Sensorcaine was injected in and around the incisions before a sterile bulky dressing was applied over the two wounds. The patient was placed into a volar wrist splint before being awakened, extubated, and returned to the recovery room in satisfactory condition after tolerating the procedure well.

## 2020-08-13 NOTE — Anesthesia Preprocedure Evaluation (Signed)
Anesthesia Evaluation  Patient identified by MRN, date of birth, ID band Patient awake    Reviewed: Allergy & Precautions, H&P , NPO status , Patient's Chart, lab work & pertinent test results, reviewed documented beta blocker date and time   Airway Mallampati: II  TM Distance: >3 FB Neck ROM: full    Dental  (+) Teeth Intact   Pulmonary neg pulmonary ROS,    Pulmonary exam normal        Cardiovascular Exercise Tolerance: Good hypertension, On Medications Normal cardiovascular exam+ Valvular Problems/Murmurs  Rate:Normal     Neuro/Psych negative neurological ROS  negative psych ROS   GI/Hepatic negative GI ROS, Neg liver ROS,   Endo/Other  negative endocrine ROSdiabetes, Well Controlled, Type 1, Insulin Dependent  Renal/GU negative Renal ROS  negative genitourinary   Musculoskeletal   Abdominal   Peds  Hematology negative hematology ROS (+)   Anesthesia Other Findings   Reproductive/Obstetrics negative OB ROS                             Anesthesia Physical Anesthesia Plan  ASA: III  Anesthesia Plan: General LMA   Post-op Pain Management:    Induction:   PONV Risk Score and Plan: 4 or greater  Airway Management Planned:   Additional Equipment:   Intra-op Plan:   Post-operative Plan:   Informed Consent: I have reviewed the patients History and Physical, chart, labs and discussed the procedure including the risks, benefits and alternatives for the proposed anesthesia with the patient or authorized representative who has indicated his/her understanding and acceptance.       Plan Discussed with: CRNA  Anesthesia Plan Comments:         Anesthesia Quick Evaluation

## 2020-08-13 NOTE — H&P (Signed)
History of Present Illness:  Brianna Rogers is a 74 y.o. female who presents for evaluation and treatment of her bilateral hand and wrist pain and paresthesias. She notes that the symptoms have been present for 6-8 months if not longer and affect both hands equally. She denies any specific injury to either hand. She used to perform administrative duties at work, but retired 9 years ago. 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 Initially, the patient was working with her primary care provider. She was prescribed meloxicam, first the 7.5 mg dose, then the 15 mg dose. Because of continued symptoms, she was referred to neurology who performed an EMG of both upper extremities which confirmed the presence of moderate bilateral carpal tunnel syndrome. A trial of splinting was performed which also provided little relief of her symptoms, therefore, the patient was referred to me for further evaluation and treatment. The patient notes that her symptoms are worse at night as well as with any repetitive activities during the day. She has difficulty making a full fist and grasping or holding objects. She notes that all five fingers do go numb, but that the most affected are the index, long, and ring fingers. She also notes intermittent catching of the right long finger and the left index finger.  Current Outpatient Medications: . aspirin 81 MG EC tablet Take 81 mg by mouth once daily.  . flash glucose scanning reader (FREESTYLE LIBRE 14 DAY READER) Misc Use 1 Device as directed 1 each 0  . FREESTYLE LIBRE 14 DAY SENSOR kit USE AS DIRECTED EVERY 14 DAYS 12 kit 1  . imiquimod (ALDARA) 5 % cream Apply topically as needed  . insulin GLARGINE (LANTUS SOLOSTAR U-100 INSULIN) pen injector (concentration 100 units/mL) Inject 35 Units subcutaneously once daily 30 mL 1  . insulin LISPRO (HUMALOG KWIKPEN INSULIN) pen injector (concentration 100  units/mL) ADMINISTER 10 UNITS UNDER THE SKIN WITH BREAKFAST AND SUPPER AS DIRECTED 20 mL 1  . letrozole (FEMARA) 2.5 mg tablet Take 2.5 mg by mouth once daily  . meloxicam (MOBIC) 15 MG tablet Take 1 tablet (15 mg total) by mouth once daily 90 tablet 1  . metFORMIN (GLUCOPHAGE) 1000 MG tablet TAKE 1 TABLET(1000 MG) BY MOUTH TWICE DAILY WITH MEALS 180 tablet 1  . multivitamin tablet Take 1 tablet by mouth once daily.  Marland Kitchen nystatin (MYCOSTATIN) 100,000 unit/gram powder Apply topically 3 (three) times daily as needed  . omega-3 fatty acids-fish oil 360-1,200 mg Cap Take 1,200 mg by mouth 2 (two) times daily.  . pen needle, diabetic (BD ULTRA-FINE SHORT PEN NEEDLE) 31 gauge x 5/16" needle Apply to insulin pen once daily for administering. 100 each 12  . PREVIDENT 5000 DRY MOUTH 1.1 % gel Place onto teeth nightly  . quinapriL (ACCUPRIL) 20 MG tablet TAKE 1/2 TABLET(10 MG) BY MOUTH TWICE DAILY 90 tablet 1  . rosuvastatin (CRESTOR) 5 MG tablet TAKE 1 TABLET(5 MG) BY MOUTH EVERY DAY 90 tablet 1   Allergies:  . Fosamax [Alendronate] Itching  . Jardiance [Empagliflozin] Itching  . Nitrofurantoin Macrocrystal Other (See Comments)  Flu like symptoms  . Sulfa (Sulfonamide Antibiotics) Other (See Comments)  Flu like symptoms   Past Medical History:  . Allergy 2000  . Cardiac murmur 4128  2/6 systolic best heard at 2nd Lt ICS  . Carpal tunnel syndrome, bilateral 04/05/2020  bilateral moderate (grade III) carpal tunnel syndrome on EMG  .  Cherry angioma  . Deviated septum  s/p "scraping" procedure yrs ago, but sx returned  . Diabetes mellitus, type 2 (CMS-HCC) ~2003  . Diverticula of colon  . Hyperlipidemia, mixed ~2003  . Hypertension ~2003  . Lichen sclerosus 6812  abdomen. Tx'd by derm.  . Malignant neoplasm of upper-outer quadrant of right breast in female, estrogen receptor positive (CMS-HCC) 12/21/2017  s/p lumpectomy & XRT. On letrizole therapy.  . Microalbuminuric diabetic nephropathy  (CMS-HCC)  . Osteopenia  Based on Dexa from 12/09/15 & again in 2019 (fem neck only)  . Seborrheic keratoses  . Shingles 2008  . Vitamin D deficiency 2014  Tx'd & resolved  . Wart  Tx'd w/ cryo x1 by Dermatology   Past Surgical History:  . APPENDECTOMY ~1991  w/ TAH  . COLONOSCOPY 11/29/2009  @ [facility] - Diverticulosis, FHPolyps(m), 5 yr rpt per provider  . COLONOSCOPY 11/15/2017  Tubular adenoma of the colon/Repeat 23yr/MUS  . HYSTERECTOMY ~1991  TAH w/ BSO 2/2 benign tumor on Lt ovary  . MASTECTOMY, PARTIAL Right 12/06/2017  Dr ELesli Albee---- w/ sentinel node  . PERCUTANEOUS BIOPSY BREAST W/NEEDLE LOCALIZATION Right 11/18/2017  w/ marker chip placement  . TUBAL LIGATION ~1986   Family History:  . Diabetes type II Mother  . Hypothyroidism Mother  . High blood pressure (Hypertension) Mother  . Heart disease Mother  . Breast cancer Mother  s/p Lt mastectomy  . Melanoma Mother  . Hyperlipidemia (Elevated cholesterol) Mother  . Colon polyps Mother  . Stroke Mother  . Diabetes Mother  . Thyroid disease Mother  . Heart disease Father  . High blood pressure (Hypertension) Father  . Hyperlipidemia (Elevated cholesterol) Father  . Stroke Father  . Sudden cardiac death Father  89  Social History:   Socioeconomic History  . Marital status: Single  Spouse name: Not on file  . Number of children: 0  . Years of education: Not on file  . Highest education level: Not on file  Occupational History  . Not on file  Tobacco Use  . Smoking status: Never Smoker  . Smokeless tobacco: Never Used  Vaping Use  . Vaping Use: Never used  Substance and Sexual Activity  . Alcohol use: Never  . Drug use: Never  . Sexual activity: Not Currently  Partners: Male  Birth control/protection: None  Other Topics Concern  . Not on file   Social History Narrative:  [place], as mother's health requires increased care  giving & night time assistance. Still [place] her residence in [place] Plans to sell her house & move into mother's house full-time when mother passes.   [place]   Social Determinants of Health:   FEmergency planning/management officerStrain: Not on file  Food Insecurity: Not on file  Transportation Needs: Not on file   Review of Systems:  A comprehensive 14 point ROS was performed, reviewed, and the pertinent orthopaedic findings are documented in the HPI.  Physical Exam: Vitals:  07/22/20 0934  BP: 124/82  Weight: 72.8 kg (160 lb 6.4 oz)  Height: 152.4 cm (5')  PainSc: 4  PainLoc: Wrist   General/Constitutional: The patient appears to be well-nourished, well-developed, and in no acute distress. Neuro/Psych: Normal mood and affect, oriented to person, place and time. Eyes: Non-icteric. Pupils are equal, round, and reactive to light, and exhibit synchronous movement. ENT: Unremarkable. Lymphatic: No palpable adenopathy. Respiratory: Lungs clear  to auscultation, Normal chest excursion, No wheezes and Non-labored breathing Cardiovascular: Regular rate and rhythm. No murmurs. and No edema, swelling or tenderness, except as noted in detailed exam. Integumentary: No impressive skin lesions present, except as noted in detailed exam. Musculoskeletal: Unremarkable, except as noted in detailed exam.  Right hand exam: Skin inspection of the right hand is notable for minimal swelling diffusely over the palmar aspect of the wrist and hand, but otherwise is unremarkable. No erythema, ecchymosis, abrasions, or other skin abnormalities are identified. She exhibits full range of motion of the wrist without any pain or catching. She does have moderate tenderness to palpation over the volar aspect of the index, long, ring, and little MCP joints. She has a small area of fullness palpable over the long metacarpal head. This area moves as she flexes and extends her finger,  consistent with a trigger finger. Finger motion is limited to all digits due to pain and swelling. She is neurovascularly intact to all digits. She has a positive Phalen's test and a negative Tinel's over the carpal tunnel.  EMG results:  A recent EMG of both upper extremities is available for review and has been reviewed by myself. This study demonstrates evidence of moderate carpal tunnel syndrome bilaterally.  Assessment: . Carpal tunnel syndrome, right.  . Trigger middle finger of right hand.   Plan: The treatment options were discussed with the patient. In addition, patient educational materials were provided regarding the diagnosis and treatment options. The patient is quite frustrated by her symptoms and function limitations, and is ready to consider more aggressive treatment options. Therefore, I have recommended a surgical procedure, specifically an endoscopic right carpal tunnel release with a release of the right long trigger finger. The procedure was discussed with the patient, as were the potential risks (including bleeding, infection, nerve and/or blood vessel injury, persistent or recurrent pain/paresthesias, stiffness of her fingers, weakness of grip, need for further surgery, blood clots, strokes, heart attacks and/or arhythmias, pneumonia, etc.) and benefits. The patient states his/her understanding and wishes to proceed. All of the patient's questions and concerns were answered. She can call any time with further concerns. She will follow up post-surgery, routine.   H&P reviewed and patient re-examined. No changes.

## 2020-08-13 NOTE — Discharge Instructions (Addendum)
Orthopedic discharge instructions: Keep dressing dry and intact. Keep hand elevated above heart level. May shower after dressing removed on postop day 4 (Saturday). Cover sutures with Band-Aids after drying off. Apply ice to affected area frequently. Take Meloxicam daily with meals for 7-10 days, then as necessary. Take ES Tylenol or pain medication as prescribed when needed.  Return for follow-up in 10-14 days or as scheduled.   AMBULATORY SURGERY  DISCHARGE INSTRUCTIONS   1) The drugs that you were given will stay in your system until tomorrow so for the next 24 hours you should not:  A) Drive an automobile B) Make any legal decisions C) Drink any alcoholic beverage   2) You may resume regular meals tomorrow.  Today it is better to start with liquids and gradually work up to solid foods.  You may eat anything you prefer, but it is better to start with liquids, then soup and crackers, and gradually work up to solid foods.   3) Please notify your doctor immediately if you have any unusual bleeding, trouble breathing, redness and pain at the surgery site, drainage, fever, or pain not relieved by medication.  4) Your post-operative visit with Dr.                                     is: Date:                        Time:    Please call to schedule your post-operative visit.  5) Additional Instructions:

## 2020-08-13 NOTE — Transfer of Care (Signed)
Immediate Anesthesia Transfer of Care Note  Patient: Brianna Rogers  Procedure(s) Performed: CARPAL TUNNEL RELEASE ENDOSCOPIC WITH RELEASE OF RIGHT LONG TRIGGER FINGER (Right Wrist)  Patient Location: PACU  Anesthesia Type:General  Level of Consciousness: awake, drowsy and patient cooperative  Airway & Oxygen Therapy: Patient Spontanous Breathing and Patient connected to face mask oxygen  Post-op Assessment: Report given to RN and Post -op Vital signs reviewed and stable  Post vital signs: Reviewed and stable  Last Vitals:  Vitals Value Taken Time  BP 178/87 08/13/20 1425  Temp    Pulse 83 08/13/20 1426  Resp 14 08/13/20 1426  SpO2 100 % 08/13/20 1426  Vitals shown include unvalidated device data.  Last Pain:  Vitals:   08/13/20 1159  TempSrc:   PainSc: 0-No pain         Complications: No complications documented.

## 2020-08-14 ENCOUNTER — Encounter: Payer: Self-pay | Admitting: Surgery

## 2020-08-14 NOTE — Anesthesia Postprocedure Evaluation (Signed)
Anesthesia Post Note  Patient: Brianna Rogers  Procedure(s) Performed: CARPAL TUNNEL RELEASE ENDOSCOPIC WITH RELEASE OF RIGHT LONG TRIGGER FINGER (Right Wrist)  Patient location during evaluation: PACU Anesthesia Type: General Level of consciousness: awake and alert Pain management: pain level controlled Vital Signs Assessment: post-procedure vital signs reviewed and stable Respiratory status: spontaneous breathing, nonlabored ventilation, respiratory function stable and patient connected to nasal cannula oxygen Cardiovascular status: blood pressure returned to baseline and stable Postop Assessment: no apparent nausea or vomiting Anesthetic complications: no   No complications documented.   Last Vitals:  Vitals:   08/13/20 1456 08/13/20 1508  BP: (!) 168/97 (!) 186/73  Pulse: 71 72  Resp: 17 15  Temp:  36.4 C  SpO2: 97% 97%    Last Pain:  Vitals:   08/13/20 1508  TempSrc: Oral  PainSc: 0-No pain                 Molli Barrows

## 2020-09-30 ENCOUNTER — Encounter: Payer: Self-pay | Admitting: Occupational Therapy

## 2020-09-30 ENCOUNTER — Ambulatory Visit: Payer: Medicare Other | Attending: Surgery | Admitting: Occupational Therapy

## 2020-09-30 ENCOUNTER — Other Ambulatory Visit: Payer: Self-pay

## 2020-09-30 DIAGNOSIS — L905 Scar conditions and fibrosis of skin: Secondary | ICD-10-CM | POA: Diagnosis present

## 2020-09-30 DIAGNOSIS — M25542 Pain in joints of left hand: Secondary | ICD-10-CM | POA: Diagnosis present

## 2020-09-30 DIAGNOSIS — M25631 Stiffness of right wrist, not elsewhere classified: Secondary | ICD-10-CM | POA: Insufficient documentation

## 2020-09-30 DIAGNOSIS — M25642 Stiffness of left hand, not elsewhere classified: Secondary | ICD-10-CM | POA: Diagnosis present

## 2020-09-30 DIAGNOSIS — M25641 Stiffness of right hand, not elsewhere classified: Secondary | ICD-10-CM | POA: Diagnosis present

## 2020-09-30 DIAGNOSIS — M25541 Pain in joints of right hand: Secondary | ICD-10-CM | POA: Insufficient documentation

## 2020-09-30 DIAGNOSIS — R600 Localized edema: Secondary | ICD-10-CM | POA: Insufficient documentation

## 2020-09-30 NOTE — Therapy (Signed)
[facility] [address], [place], [address] Phone: [phone]   Fax:  [phone]  Occupational Therapy Evaluation  Patient Details  Name: Brianna Rogers MRN: [id number] Date of Birth: 1946-02-27 Referring Provider (OT): Dr Roland Rack   Encounter Date: 09/30/2020   OT End of Session - 09/30/20 1409    Visit Number 1    Number of Visits 12    Date for OT Re-Evaluation 11/11/20    OT Start Time 1030    OT Stop Time 1129    OT Time Calculation (min) 59 min    Activity Tolerance Patient tolerated treatment well    Behavior During Therapy [facility] for tasks assessed/performed           Past Medical History:  Diagnosis Date  . Breast cancer ([place])   . Breast cancer, right ([place]) 11/18/2017  . Diabetes mellitus without complication ([place])   . Heart murmur   . Hypertension   . Lichen sclerosus et atrophicus   . Personal history of radiation therapy     Past Surgical History:  Procedure Laterality Date  . ABDOMINAL HYSTERECTOMY    . APPENDECTOMY    . BREAST BIOPSY Right 11/18/2017   INVASIVE MAMMARY CARCINOMA.   Marland Kitchen BREAST LUMPECTOMY Right 12/06/2017   invasive mammary carcinoma DCIS LCIS  with RAD  . CARPAL TUNNEL RELEASE Right 08/13/2020   Procedure: CARPAL TUNNEL RELEASE ENDOSCOPIC WITH RELEASE OF RIGHT LONG TRIGGER FINGER;  Surgeon: Corky Mull, MD;  Location: [facility] ORS;  Service: Orthopedics;  Laterality: Right;  . COLONOSCOPY    . COLONOSCOPY WITH PROPOFOL N/A 11/15/2017   Procedure: COLONOSCOPY WITH PROPOFOL;  Surgeon: Lollie Sails, MD;  Location: [facility] ENDOSCOPY;  Service: Endoscopy;  Laterality: N/A;  . PARTIAL MASTECTOMY WITH NEEDLE LOCALIZATION Right 12/06/2017   Procedure: PARTIAL MASTECTOMY WITH NEEDLE LOCALIZATION;  Surgeon: Herbert Pun, MD;  Location: [facility] ORS;  Service: General;  Laterality: Right;  . SENTINEL NODE BIOPSY Right 12/06/2017   Procedure: SENTINEL NODE BIOPSY;  Surgeon:  Herbert Pun, MD;  Location: [facility] ORS;  Service: General;  Laterality: Right;  . SEPTOPLASTY    . TUBAL LIGATION      There were no vitals filed for this visit.   Subjective Assessment - 09/30/20 1222    Subjective  My swelling under my arm from breast CA surgery doing well- but had CTR and trigger finger release -but cont to have sweling , tightness, stiffness, numbness - cannot use it like I should - need some help    Pertinent History 08/13/2020 had R endoscopy CTR and 3rd digit trigger finger release - done by Dr Roland Rack - refer to OT for eval and tx - cont stiffness, discomfort, stiffness , scar tissue and edema    Patient Stated Goals Want to get my R hand better so I can use it - want to be able to make fist , use it to work out and do things around the house , get numbness better    Currently in Pain? Yes    Pain Score 2     Pain Location Hand    Pain Orientation Right    Pain Descriptors / Indicators Tightness   stiffness   Pain Type Surgical pain    Pain Onset More than a month ago    Pain Frequency Intermittent             [facility] OT Assessment - 09/30/20 0001  Assessment   Medical Diagnosis R CTR and 3rd trigger finger release    Referring Provider (OT) Dr Joice Lofts    Onset Date/Surgical Date 08/13/20    Hand Dominance Right    Next MD Visit --   Febr   Prior Therapy April 21 for lymphedema R axilla      Home  Environment   Lives With Alone      Prior Function   Vocation Retired    Leisure want to start workout again yardwork , coloring book, do own house work       AROM   Right Wrist Extension 55 Degrees    Right Wrist Flexion 80 Degrees    Right Wrist Radial Deviation 18 Degrees    Right Wrist Ulnar Deviation 25 Degrees    Left Wrist Extension 70 Degrees    Left Wrist Flexion 90 Degrees    Left Wrist Radial Deviation 20 Degrees    Left Wrist Ulnar Deviation 30 Degrees      Right Hand AROM   R Thumb Opposition to Index --   Opposition to base of  5th   R Index  MCP 0-90 70 Degrees    R Index PIP 0-100 90 Degrees    R Long  MCP 0-90 72 Degrees    R Long PIP 0-100 90 Degrees    R Ring  MCP 0-90 75 Degrees    R Ring PIP 0-100 90 Degrees    R Little  MCP 0-90 70 Degrees    R Little PIP 0-100 90 Degrees              contrast done to R hand prior to review of HEP -and hand out provided:    Contrast 2-3 x day  Scar massage - and night time cica scar pad over both scars -and isotoner glove night time and as needed during day Fitted with scar pad and ed on using - and fitting with isotoner glove   Extention of digits- tapping 10 reps Tendon glides - stop when feeling pull  10 reps  Pain free And Opposition to all digits- 5 reps AAROM for wrist flexion , extention 10 reps MEd N glide 5 reps  Ice massage over 3rd and 4th A1 pulley's several times during day              OT Education - 09/30/20 1408    Education Details Findings of eval and HEP    Person(s) Educated Patient    Methods Explanation;Demonstration;Tactile cues;Verbal cues;Handout    Comprehension Verbal cues required;Returned demonstration;Verbalized understanding            OT Short Term Goals - 09/30/20 1414      OT SHORT TERM GOAL #1   Title Pt to be independent in HEP to decrease edema , scar tissue and increase AROM to touch palm without increase symptoms    Baseline decrease MC 70-75 flexion, decrease extention - edema and stiffness - tenderness over  A1pulley of 4th - scar itssue - triggering of 4th with composite fist    Time 3    Period Weeks    Status New    Target Date 10/21/20             OT Long Term Goals - 09/30/20 1415      OT LONG TERM GOAL #1   Title Pt to show increase AROM in R dominant hand to WNL to have no increase symptoms and use hand to carry more  than 5 lbs without increase symptoms    Baseline Pt do not carry or lift objects more than 2 lbs with R hand - decrease flexion , increase scar tissue , decrease  extention of 3rd digit- triggering with composite fist on 4th digit    Time 4    Period Weeks    Status New    Target Date 10/28/20      OT LONG TERM GOAL #2   Title R grip and prehension strength increase to in range for her age withou increase symptoms    Baseline NT - pain and edema , stiffness - decrease AROM - 7 wks s/p - Grip to increase more than 33 lbs , lat and 3 point more than 8-9 lbs    Time 6    Period Weeks    Status New    Target Date 11/11/20      OT LONG TERM GOAL #3   Title Pt return to use hand in all ADL's and IADL's prior to onset of CT symptoms    Baseline Not using  hand - report mod to severe difficulty using R dominant hand still -    Time 6    Period Weeks    Status New    Target Date 11/11/20                 Plan - 09/30/20 1409    Clinical Impression Statement Pt present at OT eval tomorrow 7 wks s/p  R endoscopic R CTR and 3rd digit trigger finger release - pt with increase edema in hand , decrease flexion and extention of MC more than PIP's , wrist flexion , extention ,  increase scar tissue, pain and tenderness over A1pulley of 4th and triggering with composite fist, Gyons canal tenderness - decrease strength - pt can benefit from OT services to increase use of R dominant hand in ADL's and IADL's    OT Occupational Profile and History Problem Focused Assessment - Including review of records relating to presenting problem    Occupational performance deficits (Please refer to evaluation for details): ADL's;IADL's;Leisure;Play    Body Structure / Function / Physical Skills Flexibility;UE functional use;Pain;Edema;IADL;ADL    Rehab Potential Fair    Clinical Decision Making Limited treatment options, no task modification necessary    Comorbidities Affecting Occupational Performance: May have comorbidities impacting occupational performance    Modification or Assistance to Complete Evaluation  No modification of tasks or assist necessary to complete  eval    OT Frequency 2x / week    OT Duration 6 weeks    OT Treatment/Interventions Self-care/ADL training;Contrast Bath;Manual Therapy;Patient/family education;Therapeutic exercise;Cryotherapy;Iontophoresis;Paraffin;Fluidtherapy;Ultrasound;Passive range of motion;Scar mobilization;Splinting    Plan assess progress with HEP and change HEP as needed    OT Home Exercise Plan see pt instruction    Consulted and Agree with Plan of Care Patient           Patient will benefit from skilled therapeutic intervention in order to improve the following deficits and impairments:   Body Structure / Function / Physical Skills: Flexibility,UE functional use,Pain,Edema,IADL,ADL       Visit Diagnosis: Localized edema - Plan: Ot plan of care cert/re-cert  Pain in joint of right hand - Plan: Ot plan of care cert/re-cert  Stiffness of right hand, not elsewhere classified - Plan: Ot plan of care cert/re-cert  Scar tissue - Plan: Ot plan of care cert/re-cert  Stiffness of right wrist, not elsewhere classified - Plan: Ot plan of care  cert/re-cert    Problem List Patient Active Problem List   Diagnosis Date Noted  . Hypercalcemia 03/20/2018  . Osteopenia of neck of left femur 12/20/2017  . Breast cancer, right ([place]) 11/18/2017    Rosalyn Gess OTR/L,CLT 09/30/2020, 2:23 PM  [facility] [address], [place], [address] Phone: [phone]   Fax:  [phone]  Name: Brianna Rogers MRN: [id number] Date of Birth: 1946-06-05

## 2020-09-30 NOTE — Patient Instructions (Signed)
Contrast 2-3 x day  Scar massage - and night time cica scar pad over both scars -and isotoner glove night time and as needed during day Extention of digits- tapping 10 reps Tendon glides - stop when feeling pull  10 reps  Pain free And Opposition to all digits- 5 reps AAROM for wrist flexion , extention 10 reps MEd N glide 5 reps  Ice massage over 3rd and 4th A1 pulley's several times during day

## 2020-10-02 ENCOUNTER — Other Ambulatory Visit: Payer: Self-pay

## 2020-10-02 ENCOUNTER — Ambulatory Visit: Payer: Medicare Other | Admitting: Occupational Therapy

## 2020-10-02 DIAGNOSIS — L905 Scar conditions and fibrosis of skin: Secondary | ICD-10-CM

## 2020-10-02 DIAGNOSIS — M25642 Stiffness of left hand, not elsewhere classified: Secondary | ICD-10-CM

## 2020-10-02 DIAGNOSIS — M25541 Pain in joints of right hand: Secondary | ICD-10-CM

## 2020-10-02 DIAGNOSIS — M25641 Stiffness of right hand, not elsewhere classified: Secondary | ICD-10-CM

## 2020-10-02 DIAGNOSIS — R600 Localized edema: Secondary | ICD-10-CM | POA: Diagnosis not present

## 2020-10-02 DIAGNOSIS — M25631 Stiffness of right wrist, not elsewhere classified: Secondary | ICD-10-CM

## 2020-10-02 DIAGNOSIS — M25542 Pain in joints of left hand: Secondary | ICD-10-CM

## 2020-10-02 NOTE — Therapy (Signed)
[facility] [address], [place], [address] Phone: [phone]   Fax:  [phone]  Occupational Therapy Treatment  Patient Details  Name: Brianna Rogers MRN: [id number] Date of Birth: 1946-07-25 Referring Provider (OT): Dr Roland Rack   Encounter Date: 10/02/2020   OT End of Session - 10/02/20 1711    Visit Number 2    Number of Visits 12    Date for OT Re-Evaluation 11/11/20    OT Start Time 7371    OT Stop Time 1640    OT Time Calculation (min) 46 min    Activity Tolerance Patient tolerated treatment well    Behavior During Therapy [facility] for tasks assessed/performed           Past Medical History:  Diagnosis Date  . Breast cancer ([place])   . Breast cancer, right ([place]) 11/18/2017  . Diabetes mellitus without complication ([place])   . Heart murmur   . Hypertension   . Lichen sclerosus et atrophicus   . Personal history of radiation therapy     Past Surgical History:  Procedure Laterality Date  . ABDOMINAL HYSTERECTOMY    . APPENDECTOMY    . BREAST BIOPSY Right 11/18/2017   INVASIVE MAMMARY CARCINOMA.   Marland Kitchen BREAST LUMPECTOMY Right 12/06/2017   invasive mammary carcinoma DCIS LCIS  with RAD  . CARPAL TUNNEL RELEASE Right 08/13/2020   Procedure: CARPAL TUNNEL RELEASE ENDOSCOPIC WITH RELEASE OF RIGHT LONG TRIGGER FINGER;  Surgeon: Corky Mull, MD;  Location: [facility] ORS;  Service: Orthopedics;  Laterality: Right;  . COLONOSCOPY    . COLONOSCOPY WITH PROPOFOL N/A 11/15/2017   Procedure: COLONOSCOPY WITH PROPOFOL;  Surgeon: Lollie Sails, MD;  Location: [facility] ENDOSCOPY;  Service: Endoscopy;  Laterality: N/A;  . PARTIAL MASTECTOMY WITH NEEDLE LOCALIZATION Right 12/06/2017   Procedure: PARTIAL MASTECTOMY WITH NEEDLE LOCALIZATION;  Surgeon: Herbert Pun, MD;  Location: [facility] ORS;  Service: General;  Laterality: Right;  . SENTINEL NODE BIOPSY Right 12/06/2017   Procedure: SENTINEL NODE BIOPSY;  Surgeon:  Herbert Pun, MD;  Location: [facility] ORS;  Service: General;  Laterality: Right;  . SEPTOPLASTY    . TUBAL LIGATION      There were no vitals filed for this visit.   Subjective Assessment - 10/02/20 1709    Subjective  I did the exercises - that scar pads for night time feels so good- I did the ice and pain on pinkie side if better- not as stiff or tight - but it only has been 2 days    Pertinent History 08/13/2020 had R endoscopy CTR and 3rd digit trigger finger release - done by Dr Roland Rack - refer to OT for eval and tx - cont stiffness, discomfort, stiffness , scar tissue and edema    Patient Stated Goals Want to get my R hand better so I can use it - want to be able to make fist , use it to work out and do things around the house , get numbness better    Currently in Pain? Yes    Pain Score 2     Pain Location Hand    Pain Orientation Right    Pain Descriptors / Indicators Tender;Tightness    Pain Type Surgical pain    Pain Onset More than a month ago    Pain Frequency Intermittent    Aggravating Factors  tightness and tender over A1pulley of 4th - and scar hyper sensitive  MC flexion on L - 5th 90, 4th 85, 3rd 80 and 2nd 70 - decrease stiffness and tightness per pt  And less tenderness over ulnar side of hand - but still tender over A1 pulley of 4th  Stiffness for 3rd digit extention and wrist extention  Scar adhere on CT scar radial side and on trigger finger on ulnar side Hyper sensitive for massage , and rougher textures - pt ed on doing at home              OT Treatments/Exercises (OP) - 10/02/20 0001      Ultrasound   Ultrasound Location scars , A1pulley of 4th    Ultrasound Parameters 3.3MHZ, 20 %, 1.0 intensity    Ultrasound Goals Pain;Edema      RUE Contrast Bath   Time 8 minutes    Comments prior to ROM and soft tissue - decrease stiffness           contrast done to R hand prior to review of HEP again-and hand out provided last time -  pt to cont with   Contrast 2-3 x day  Scar massage done by OT manual , mini massager and extractor - and cont night time cica scar pad over both scars -and isotoner glove night time and as needed during day - feels good per pt Fitted with scar pad and ed on using - and fitting with isotoner glove last time  Extention of digits- tapping 5 reps each Tendon glides - stop when feeling pull  10 reps  Pain free And Opposition to all digits- 5 reps AAROM for wrist flexion , extention 10 reps done by OT this date - including 3rd digit extention  MEd N glide 5 reps  Ice massage over 3rd and 4th A1 pulley's several times during day         OT Education - 10/02/20 1711    Education Details Review of HEP - ed on desentitization    Person(s) Educated Patient    Methods Explanation;Demonstration;Tactile cues;Verbal cues;Handout    Comprehension Verbal cues required;Returned demonstration;Verbalized understanding            OT Short Term Goals - 09/30/20 1414      OT SHORT TERM GOAL #1   Title Pt to be independent in HEP to decrease edema , scar tissue and increase AROM to touch palm without increase symptoms    Baseline decrease MC 70-75 flexion, decrease extention - edema and stiffness - tenderness over  A1pulley of 4th - scar itssue - triggering of 4th with composite fist    Time 3    Period Weeks    Status New    Target Date 10/21/20             OT Long Term Goals - 09/30/20 1415      OT LONG TERM GOAL #1   Title Pt to show increase AROM in R dominant hand to WNL to have no increase symptoms and use hand to carry more than 5 lbs without increase symptoms    Baseline Pt do not carry or lift objects more than 2 lbs with R hand - decrease flexion , increase scar tissue , decrease extention of 3rd digit- triggering with composite fist on 4th digit    Time 4    Period Weeks    Status New    Target Date 10/28/20      OT LONG TERM GOAL #2   Title R grip and prehension  strength  increase to in range for her age withou increase symptoms    Baseline NT - pain and edema , stiffness - decrease AROM - 7 wks s/p - Grip to increase more than 33 lbs , lat and 3 point more than 8-9 lbs    Time 6    Period Weeks    Status New    Target Date 11/11/20      OT LONG TERM GOAL #3   Title Pt return to use hand in all ADL's and IADL's prior to onset of CT symptoms    Baseline Not using  hand - report mod to severe difficulty using R dominant hand still -    Time 6    Period Weeks    Status New    Target Date 11/11/20                 Plan - 10/02/20 1712    Clinical Impression Statement Pt about 7 wks s/p R endoscopy R CTR and 3rd digit trigger finger release pt show increase AROM in MC's this date since evaluation 2 days ago -doing well with HEP - pt to no over do scar mobs - but is adhere at each scar - and hyper sensitivite- pt ed on desentitization for massage, , soft and roughter textures    OT Occupational Profile and History Problem Focused Assessment - Including review of records relating to presenting problem    Occupational performance deficits (Please refer to evaluation for details): ADL's;IADL's;Leisure;Play    Body Structure / Function / Physical Skills Flexibility;UE functional use;Pain;Edema;IADL;ADL    Rehab Potential Fair    Clinical Decision Making Limited treatment options, no task modification necessary    Comorbidities Affecting Occupational Performance: May have comorbidities impacting occupational performance    Modification or Assistance to Complete Evaluation  No modification of tasks or assist necessary to complete eval    OT Frequency 2x / week    OT Duration 6 weeks    OT Treatment/Interventions Self-care/ADL training;Contrast Bath;Manual Therapy;Patient/family education;Therapeutic exercise;Cryotherapy;Iontophoresis;Paraffin;Fluidtherapy;Ultrasound;Passive range of motion;Scar mobilization;Splinting    Plan assess progress with HEP  and change HEP as needed    OT Home Exercise Plan see pt instruction    Consulted and Agree with Plan of Care Patient           Patient will benefit from skilled therapeutic intervention in order to improve the following deficits and impairments:   Body Structure / Function / Physical Skills: Flexibility,UE functional use,Pain,Edema,IADL,ADL       Visit Diagnosis: Localized edema  Pain in joint of right hand  Stiffness of right hand, not elsewhere classified  Scar tissue  Stiffness of right wrist, not elsewhere classified  Pain in joint of left hand  Stiffness of left hand, not elsewhere classified    Problem List Patient Active Problem List   Diagnosis Date Noted  . Hypercalcemia 03/20/2018  . Osteopenia of neck of left femur 12/20/2017  . Breast cancer, right ([place]) 11/18/2017    Rosalyn Gess OTR/L,CLT  10/02/2020, 5:16 PM  [facility] [address] [place], [address] Phone: [phone]   Fax:  [phone]  Name: Brianna Rogers MRN: [id number] Date of Birth: 01/20/1946

## 2020-10-07 ENCOUNTER — Encounter: Payer: Medicare Other | Admitting: Occupational Therapy

## 2020-10-08 ENCOUNTER — Ambulatory Visit: Payer: Medicare Other | Admitting: Occupational Therapy

## 2020-10-10 ENCOUNTER — Ambulatory Visit: Payer: Medicare Other | Admitting: Occupational Therapy

## 2020-10-10 ENCOUNTER — Other Ambulatory Visit: Payer: Self-pay

## 2020-10-10 DIAGNOSIS — M25631 Stiffness of right wrist, not elsewhere classified: Secondary | ICD-10-CM

## 2020-10-10 DIAGNOSIS — M25541 Pain in joints of right hand: Secondary | ICD-10-CM

## 2020-10-10 DIAGNOSIS — M25641 Stiffness of right hand, not elsewhere classified: Secondary | ICD-10-CM

## 2020-10-10 DIAGNOSIS — L905 Scar conditions and fibrosis of skin: Secondary | ICD-10-CM

## 2020-10-10 DIAGNOSIS — R600 Localized edema: Secondary | ICD-10-CM | POA: Diagnosis not present

## 2020-10-10 NOTE — Therapy (Signed)
[place] [facility] [address], [place], [address] Phone: [phone]   Fax:  [phone]  Occupational Therapy Treatment  Patient Details  Name: Brianna Rogers MRN: [id number] Date of Birth: 1945-10-27 Referring Provider (OT): Dr Roland Rack   Encounter Date: 10/10/2020   OT End of Session - 10/10/20 0944    Visit Number 3    Number of Visits 12    Date for OT Re-Evaluation 11/11/20    OT Start Time 0846    OT Stop Time 0930    OT Time Calculation (min) 44 min    Activity Tolerance Patient tolerated treatment well    Behavior During Therapy [facility] for tasks assessed/performed           Past Medical History:  Diagnosis Date  . Breast cancer ([place])   . Breast cancer, right ([place]) 11/18/2017  . Diabetes mellitus without complication ([place])   . Heart murmur   . Hypertension   . Lichen sclerosus et atrophicus   . Personal history of radiation therapy     Past Surgical History:  Procedure Laterality Date  . ABDOMINAL HYSTERECTOMY    . APPENDECTOMY    . BREAST BIOPSY Right 11/18/2017   INVASIVE MAMMARY CARCINOMA.   Marland Kitchen BREAST LUMPECTOMY Right 12/06/2017   invasive mammary carcinoma DCIS LCIS  with RAD  . CARPAL TUNNEL RELEASE Right 08/13/2020   Procedure: CARPAL TUNNEL RELEASE ENDOSCOPIC WITH RELEASE OF RIGHT LONG TRIGGER FINGER;  Surgeon: Corky Mull, MD;  Location: [facility] ORS;  Service: Orthopedics;  Laterality: Right;  . COLONOSCOPY    . COLONOSCOPY WITH PROPOFOL N/A 11/15/2017   Procedure: COLONOSCOPY WITH PROPOFOL;  Surgeon: Lollie Sails, MD;  Location: [facility] ENDOSCOPY;  Service: Endoscopy;  Laterality: N/A;  . PARTIAL MASTECTOMY WITH NEEDLE LOCALIZATION Right 12/06/2017   Procedure: PARTIAL MASTECTOMY WITH NEEDLE LOCALIZATION;  Surgeon: Herbert Pun, MD;  Location: [facility] ORS;  Service: General;  Laterality: Right;  . SENTINEL NODE BIOPSY Right 12/06/2017   Procedure: SENTINEL NODE BIOPSY;  Surgeon:  Herbert Pun, MD;  Location: [facility] ORS;  Service: General;  Laterality: Right;  . SEPTOPLASTY    . TUBAL LIGATION      There were no vitals filed for this visit.   Subjective Assessment - 10/10/20 0906    Subjective  Doing better- I can tell difference - but still some burning at the base of 4th - I should have told Dr Roland Rack that the ring was also trigger finger - but because of the swelling it did not trigger- my wrist was hurting for few days- did shovel the snow and push carefully it with base of palm - not gripping -but did it to myself    Pertinent History 08/13/2020 had R endoscopy CTR and 3rd digit trigger finger release - done by Dr Roland Rack - refer to OT for eval and tx - cont stiffness, discomfort, stiffness , scar tissue and edema    Patient Stated Goals Want to get my R hand better so I can use it - want to be able to make fist , use it to work out and do things around the house , get numbness better    Currently in Pain? No/denies            Cont to have tightness or stiffness more than pain  MC's 5th 90, 4th 85, 3rd 80 and 2nd 75  Tenderness over A1pulley of 4th -  3rd tenderness over scar less Wrist  extention 60  Tightness on volar digits and wrist extention Tenderness over [place]'s canal better             OT Treatments/Exercises (OP) - 10/10/20 0001      Ultrasound   Ultrasound Location scar , A1pulley of 4th    Ultrasound Parameters 3.3HmZ, 20%, 1.0 intensity    Ultrasound Goals Pain;Edema      RUE Contrast Bath   Time 8 minutes    Comments prior to ROM and soft tissue            contrast done to R hand prior to scar massage and mobs and ROM Contrast 2-3 x day at home to do Scar massage done by OT manual , mini massager and extractor - and cont night time cica scar pad over both scars -and isotoner glove night time and as needed during day - feels good per pt Done graston tool nr 2 this time to on volar palm and scar gentle - brushing    Extention of digits- tapping 5 reps each Tendon glides - stop when feeling pull - able to touch palm this date  10 reps  Pain free And Opposition to all digits- 5 reps AAROM for wrist flexion , extention 10 reps - using other hand prior to med N glide MEd N glide 5 reps  Ice massage over 3rd and 4th A1 pulley's several times during day       OT Education - 10/10/20 0944    Education Details Review of HEP - ed on desentitization and scar mobs    Person(s) Educated Patient    Methods Explanation;Demonstration;Tactile cues;Verbal cues;Handout    Comprehension Verbal cues required;Returned demonstration;Verbalized understanding            OT Short Term Goals - 09/30/20 1414      OT SHORT TERM GOAL #1   Title Pt to be independent in HEP to decrease edema , scar tissue and increase AROM to touch palm without increase symptoms    Baseline decrease MC 70-75 flexion, decrease extention - edema and stiffness - tenderness over  A1pulley of 4th - scar itssue - triggering of 4th with composite fist    Time 3    Period Weeks    Status New    Target Date 10/21/20             OT Long Term Goals - 09/30/20 1415      OT LONG TERM GOAL #1   Title Pt to show increase AROM in R dominant hand to WNL to have no increase symptoms and use hand to carry more than 5 lbs without increase symptoms    Baseline Pt do not carry or lift objects more than 2 lbs with R hand - decrease flexion , increase scar tissue , decrease extention of 3rd digit- triggering with composite fist on 4th digit    Time 4    Period Weeks    Status New    Target Date 10/28/20      OT LONG TERM GOAL #2   Title R grip and prehension strength increase to in range for her age withou increase symptoms    Baseline NT - pain and edema , stiffness - decrease AROM - 7 wks s/p - Grip to increase more than 33 lbs , lat and 3 point more than 8-9 lbs    Time 6    Period Weeks    Status New    Target Date 11/11/20  OT  LONG TERM GOAL #3   Title Pt return to use hand in all ADL's and IADL's prior to onset of CT symptoms    Baseline Not using  hand - report mod to severe difficulty using R dominant hand still -    Time 6    Period Weeks    Status New    Target Date 11/11/20                 Plan - 10/10/20 0945    Clinical Impression Statement Pt is 8 wks s/p R endoscopy R CTR and 3rd trigger finger release - cont to be tender over 4th A1pulley  but show increase AROM in all MC's -2nd this time and increase wrist extention - scars still adhere but less tenderness - cont to have numbness mostly in 3rd    OT Occupational Profile and History Problem Focused Assessment - Including review of records relating to presenting problem    Occupational performance deficits (Please refer to evaluation for details): ADL's;IADL's;Leisure;Play    Body Structure / Function / Physical Skills Flexibility;UE functional use;Pain;Edema;IADL;ADL    Rehab Potential Fair    Clinical Decision Making Limited treatment options, no task modification necessary    Comorbidities Affecting Occupational Performance: May have comorbidities impacting occupational performance    Modification or Assistance to Complete Evaluation  No modification of tasks or assist necessary to complete eval    OT Frequency 2x / week    OT Duration 6 weeks    OT Treatment/Interventions Self-care/ADL training;Contrast Bath;Manual Therapy;Patient/family education;Therapeutic exercise;Cryotherapy;Iontophoresis;Paraffin;Fluidtherapy;Ultrasound;Passive range of motion;Scar mobilization;Splinting    Plan assess progress with HEP and change HEP as needed    OT Home Exercise Plan see pt instruction    Consulted and Agree with Plan of Care Patient           Patient will benefit from skilled therapeutic intervention in order to improve the following deficits and impairments:   Body Structure / Function / Physical Skills: Flexibility,UE functional  use,Pain,Edema,IADL,ADL       Visit Diagnosis: Localized edema  Pain in joint of right hand  Stiffness of right hand, not elsewhere classified  Scar tissue  Stiffness of right wrist, not elsewhere classified    Problem List Patient Active Problem List   Diagnosis Date Noted  . Hypercalcemia 03/20/2018  . Osteopenia of neck of left femur 12/20/2017  . Breast cancer, right ([place]) 11/18/2017    Rosalyn Gess OTR/L,CLT  10/10/2020, 9:51 AM  [place] [facility] [address] [place], [address] Phone: [phone]   Fax:  [phone]  Name: Brianna Rogers MRN: [id number] Date of Birth: 1945-12-27

## 2020-10-15 ENCOUNTER — Other Ambulatory Visit: Payer: Self-pay

## 2020-10-15 ENCOUNTER — Ambulatory Visit: Payer: Medicare Other | Admitting: Occupational Therapy

## 2020-10-15 DIAGNOSIS — R600 Localized edema: Secondary | ICD-10-CM

## 2020-10-15 DIAGNOSIS — M25641 Stiffness of right hand, not elsewhere classified: Secondary | ICD-10-CM

## 2020-10-15 DIAGNOSIS — M25631 Stiffness of right wrist, not elsewhere classified: Secondary | ICD-10-CM

## 2020-10-15 DIAGNOSIS — M25541 Pain in joints of right hand: Secondary | ICD-10-CM

## 2020-10-15 DIAGNOSIS — L905 Scar conditions and fibrosis of skin: Secondary | ICD-10-CM

## 2020-10-15 NOTE — Therapy (Signed)
[place] [facility] [address], [place], [address] Phone: [phone]   Fax:  [phone]  Occupational Therapy Treatment  Patient Details  Name: Brianna Rogers MRN: [id number] Date of Birth: 01-Sep-1946 Referring Provider (OT): Dr Roland Rack   Encounter Date: 10/15/2020   OT End of Session - 10/15/20 0900    Visit Number 4    Number of Visits 12    Date for OT Re-Evaluation 11/11/20    OT Start Time 0851    OT Stop Time 0927    OT Time Calculation (min) 36 min    Activity Tolerance Patient tolerated treatment well    Behavior During Therapy [facility] for tasks assessed/performed           Past Medical History:  Diagnosis Date  . Breast cancer ([place])   . Breast cancer, right ([place]) 11/18/2017  . Diabetes mellitus without complication ([place])   . Heart murmur   . Hypertension   . Lichen sclerosus et atrophicus   . Personal history of radiation therapy     Past Surgical History:  Procedure Laterality Date  . ABDOMINAL HYSTERECTOMY    . APPENDECTOMY    . BREAST BIOPSY Right 11/18/2017   INVASIVE MAMMARY CARCINOMA.   Marland Kitchen BREAST LUMPECTOMY Right 12/06/2017   invasive mammary carcinoma DCIS LCIS  with RAD  . CARPAL TUNNEL RELEASE Right 08/13/2020   Procedure: CARPAL TUNNEL RELEASE ENDOSCOPIC WITH RELEASE OF RIGHT LONG TRIGGER FINGER;  Surgeon: Corky Mull, MD;  Location: [facility] ORS;  Service: Orthopedics;  Laterality: Right;  . COLONOSCOPY    . COLONOSCOPY WITH PROPOFOL N/A 11/15/2017   Procedure: COLONOSCOPY WITH PROPOFOL;  Surgeon: Lollie Sails, MD;  Location: [facility] ENDOSCOPY;  Service: Endoscopy;  Laterality: N/A;  . PARTIAL MASTECTOMY WITH NEEDLE LOCALIZATION Right 12/06/2017   Procedure: PARTIAL MASTECTOMY WITH NEEDLE LOCALIZATION;  Surgeon: Herbert Pun, MD;  Location: [facility] ORS;  Service: General;  Laterality: Right;  . SENTINEL NODE BIOPSY Right 12/06/2017   Procedure: SENTINEL NODE BIOPSY;  Surgeon:  Herbert Pun, MD;  Location: [facility] ORS;  Service: General;  Laterality: Right;  . SEPTOPLASTY    . TUBAL LIGATION      There were no vitals filed for this visit.   Subjective Assessment - 10/15/20 0853    Subjective  I can tell is is getting better- did not burn in the palm - less pulling and stiffness in hand to make fist    Pertinent History 08/13/2020 had R endoscopy CTR and 3rd digit trigger finger release - done by Dr Roland Rack - refer to OT for eval and tx - cont stiffness, discomfort, stiffness , scar tissue and edema    Patient Stated Goals Want to get my R hand better so I can use it - want to be able to make fist , use it to work out and do things around the house , get numbness better    Currently in Pain? No/denies              [facility] OT Assessment - 10/15/20 0001      Right Hand AROM   R Index  MCP 0-90 80 Degrees    R Index PIP 0-100 100 Degrees    R Long  MCP 0-90 80 Degrees    R Long PIP 0-100 90 Degrees    R Ring  MCP 0-90 85 Degrees    R Ring PIP 0-100 90 Degrees    R Little  MCP 0-90 90 Degrees    R Little PIP 0-100 100 Degrees         increase MC and PIP flexion- less stiffness and tightness per pt  Still tender over A1pulley of 4th - but less Scar feels softer but still thick and one sport adhere in palm to 3rd digit and limited wrist extentoin  Pain with tapping of 3rd and 4th            OT Treatments/Exercises (OP) - 10/15/20 0001      RUE Fluidotherapy   Number Minutes Fluidotherapy 8 Minutes    RUE Fluidotherapy Location Hand;Wrist    Comments Wrist AROM and tendon glides - to decrease stiffness            Contrast 2-3 x day at home to do Scar massagedone by OT manual , mini massager and extractor- andcontnight time cica scar pad over both scars -and isotoner glove night time and as needed during day - feels good per pt Done graston tool nr 2 this time to on volar palm and forearm and scar gentle - brushing   Extention of  digits- tapping5reps each- pain with 3rd and 4th-  Add prayer stretch this date - for composite extention- pain free - 10 reps hold 5 sec  Tendon glides - stop when feeling pull - able to touch palm this date  10 reps  Pain free And Opposition to all digits- 5 reps AAROM for wrist flexion , extention 10 reps- using other hand prior to med N glide MEd N glide 5 reps  Ice massage over 3rd and 4th A1 pulley's several times during day - ice at end of session           OT Short Term Goals - 09/30/20 1414      OT SHORT TERM GOAL #1   Title Pt to be independent in HEP to decrease edema , scar tissue and increase AROM to touch palm without increase symptoms    Baseline decrease MC 70-75 flexion, decrease extention - edema and stiffness - tenderness over  A1pulley of 4th - scar itssue - triggering of 4th with composite fist    Time 3    Period Weeks    Status New    Target Date 10/21/20             OT Long Term Goals - 09/30/20 1415      OT LONG TERM GOAL #1   Title Pt to show increase AROM in R dominant hand to WNL to have no increase symptoms and use hand to carry more than 5 lbs without increase symptoms    Baseline Pt do not carry or lift objects more than 2 lbs with R hand - decrease flexion , increase scar tissue , decrease extention of 3rd digit- triggering with composite fist on 4th digit    Time 4    Period Weeks    Status New    Target Date 10/28/20      OT LONG TERM GOAL #2   Title R grip and prehension strength increase to in range for her age withou increase symptoms    Baseline NT - pain and edema , stiffness - decrease AROM - 7 wks s/p - Grip to increase more than 33 lbs , lat and 3 point more than 8-9 lbs    Time 6    Period Weeks    Status New    Target Date 11/11/20  OT LONG TERM GOAL #3   Title Pt return to use hand in all ADL's and IADL's prior to onset of CT symptoms    Baseline Not using  hand - report mod to severe difficulty using R  dominant hand still -    Time 6    Period Weeks    Status New    Target Date 11/11/20                 Plan - 10/15/20 0901    Clinical Impression Statement Pt is 8 1/2 wks s/p R endoscopy R CTR and 3rd trigger finger release - pt show less tenderness- scar tissue improving - scar tissue adhesion proximal - tight fascia still in palm - limiting extention of 3rd and wrist extention - did add gentle prayer stretch for composite extention- less stiffness with fisting - increase MC and PIP flexion - cont to hava some tenderness at 4th A1pulley - but better that last week    OT Occupational Profile and History Problem Focused Assessment - Including review of records relating to presenting problem    Occupational performance deficits (Please refer to evaluation for details): ADL's;IADL's;Leisure;Play    Body Structure / Function / Physical Skills Flexibility;UE functional use;Pain;Edema;IADL;ADL    Rehab Potential Fair    Clinical Decision Making Limited treatment options, no task modification necessary    Comorbidities Affecting Occupational Performance: May have comorbidities impacting occupational performance    Modification or Assistance to Complete Evaluation  No modification of tasks or assist necessary to complete eval    OT Frequency 2x / week    OT Duration 6 weeks    OT Treatment/Interventions Self-care/ADL training;Contrast Bath;Manual Therapy;Patient/family education;Therapeutic exercise;Cryotherapy;Iontophoresis;Paraffin;Fluidtherapy;Ultrasound;Passive range of motion;Scar mobilization;Splinting    Plan assess progress with HEP and change HEP as needed    OT Home Exercise Plan see pt instruction    Consulted and Agree with Plan of Care Patient           Patient will benefit from skilled therapeutic intervention in order to improve the following deficits and impairments:   Body Structure / Function / Physical Skills: Flexibility,UE functional use,Pain,Edema,IADL,ADL        Visit Diagnosis: Localized edema  Pain in joint of right hand  Stiffness of right hand, not elsewhere classified  Scar tissue  Stiffness of right wrist, not elsewhere classified    Problem List Patient Active Problem List   Diagnosis Date Noted  . Hypercalcemia 03/20/2018  . Osteopenia of neck of left femur 12/20/2017  . Breast cancer, right ([place]) 11/18/2017    Rosalyn Gess OTR/L,CLT 10/15/2020, 9:32 AM  [place] [facility] [address], [place], [address] Phone: [phone]   Fax:  [phone]  Name: Brianna Rogers MRN: [id number] Date of Birth: 03-Jul-1946

## 2020-10-18 ENCOUNTER — Other Ambulatory Visit: Payer: Self-pay

## 2020-10-18 ENCOUNTER — Ambulatory Visit: Payer: Medicare Other | Admitting: Occupational Therapy

## 2020-10-18 DIAGNOSIS — R600 Localized edema: Secondary | ICD-10-CM | POA: Diagnosis not present

## 2020-10-18 DIAGNOSIS — L905 Scar conditions and fibrosis of skin: Secondary | ICD-10-CM

## 2020-10-18 DIAGNOSIS — M25642 Stiffness of left hand, not elsewhere classified: Secondary | ICD-10-CM

## 2020-10-18 DIAGNOSIS — M25641 Stiffness of right hand, not elsewhere classified: Secondary | ICD-10-CM

## 2020-10-18 DIAGNOSIS — M25631 Stiffness of right wrist, not elsewhere classified: Secondary | ICD-10-CM

## 2020-10-18 DIAGNOSIS — M25541 Pain in joints of right hand: Secondary | ICD-10-CM

## 2020-10-18 DIAGNOSIS — M25542 Pain in joints of left hand: Secondary | ICD-10-CM

## 2020-10-18 NOTE — Therapy (Signed)
[facility] [address] [place], [address] Phone: [phone]   Fax:  [phone]  Occupational Therapy Treatment  Patient Details  Name: Brianna Rogers MRN: [id number] Date of Birth: 23-Apr-1946 Referring Provider (OT): Dr Roland Rack   Encounter Date: 10/18/2020   OT End of Session - 10/18/20 1120    Visit Number 5    Number of Visits 12    Date for OT Re-Evaluation 11/11/20    OT Start Time 0830    OT Stop Time 0933    OT Time Calculation (min) 63 min    Activity Tolerance Patient tolerated treatment well    Behavior During Therapy [facility] for tasks assessed/performed           Past Medical History:  Diagnosis Date  . Breast cancer ([place])   . Breast cancer, right ([place]) 11/18/2017  . Diabetes mellitus without complication ([place])   . Heart murmur   . Hypertension   . Lichen sclerosus et atrophicus   . Personal history of radiation therapy     Past Surgical History:  Procedure Laterality Date  . ABDOMINAL HYSTERECTOMY    . APPENDECTOMY    . BREAST BIOPSY Right 11/18/2017   INVASIVE MAMMARY CARCINOMA.   Marland Kitchen BREAST LUMPECTOMY Right 12/06/2017   invasive mammary carcinoma DCIS LCIS  with RAD  . CARPAL TUNNEL RELEASE Right 08/13/2020   Procedure: CARPAL TUNNEL RELEASE ENDOSCOPIC WITH RELEASE OF RIGHT LONG TRIGGER FINGER;  Surgeon: Corky Mull, MD;  Location: [facility] ORS;  Service: Orthopedics;  Laterality: Right;  . COLONOSCOPY    . COLONOSCOPY WITH PROPOFOL N/A 11/15/2017   Procedure: COLONOSCOPY WITH PROPOFOL;  Surgeon: Lollie Sails, MD;  Location: [facility] ENDOSCOPY;  Service: Endoscopy;  Laterality: N/A;  . PARTIAL MASTECTOMY WITH NEEDLE LOCALIZATION Right 12/06/2017   Procedure: PARTIAL MASTECTOMY WITH NEEDLE LOCALIZATION;  Surgeon: Herbert Pun, MD;  Location: [facility] ORS;  Service: General;  Laterality: Right;  . SENTINEL NODE BIOPSY Right 12/06/2017   Procedure: SENTINEL NODE BIOPSY;  Surgeon:  Herbert Pun, MD;  Location: [facility] ORS;  Service: General;  Laterality: Right;  . SEPTOPLASTY    . TUBAL LIGATION      There were no vitals filed for this visit.   Subjective Assessment - 10/18/20 0845    Subjective  Stiffness and tightness in the middel 2 fingers mostly - burning pain better- using it more still trying to avoid tight fist    Pertinent History 08/13/2020 had R endoscopy CTR and 3rd digit trigger finger release - done by Dr Roland Rack - refer to OT for eval and tx - cont stiffness, discomfort, stiffness , scar tissue and edema    Patient Stated Goals Want to get my R hand better so I can use it - want to be able to make fist , use it to work out and do things around the house , get numbness better    Currently in Pain? No/denies    Pain Score 4    tender A1pulley of 4th   Pain Location Hand    Pain Orientation Right    Pain Descriptors / Indicators Tender    Pain Type Acute pain    Pain Onset More than a month ago    Pain Frequency Intermittent              [facility] OT Assessment - 10/18/20 0001      AROM   Right Wrist Extension 70 Degrees  Right Wrist Flexion 90 Degrees    Right Wrist Radial Deviation 20 Degrees    Right Wrist Ulnar Deviation 30 Degrees    Left Wrist Extension 70 Degrees    Left Wrist Flexion 90 Degrees    Left Wrist Radial Deviation 20 Degrees    Left Wrist Ulnar Deviation 30 Degrees      Strength   Right Hand Grip (lbs) 26    Right Hand Lateral Pinch 14 lbs    Right Hand 3 Point Pinch 10 lbs    Left Hand Grip (lbs) 26    Left Hand Lateral Pinch 14 lbs    Left Hand 3 Point Pinch 11 lbs      Right Hand AROM   R Index  MCP 0-90 80 Degrees    R Index PIP 0-100 100 Degrees    R Long  MCP 0-90 85 Degrees    R Long PIP 0-100 90 Degrees    R Ring  MCP 0-90 85 Degrees    R Ring PIP 0-100 90 Degrees    R Little  MCP 0-90 90 Degrees    R Little PIP 0-100 100 Degrees           Pt report wrist and hand feels much better- but only  still tight or stiff in 3rd and 4th [facility] 's - composite fist  And with gripping -  Tender still over A1 pulley of 4th  Measurements taken this date - progress very well - prehension WFL - composite fist and grip decrease still        Skin check done prior and afterwards no issue-pt ed on what to expect - tolerated very well    OT Treatments/Exercises (OP) - 10/18/20 0001      Iontophoresis   Type of Iontophoresis Dexamethasone    Location A1pulley of 4th R    Dose small patch, 2.0 current,    Time 19 min      RUE Fluidotherapy   Number Minutes Fluidotherapy 8 Minutes    RUE Fluidotherapy Location Hand;Wrist           Contrast 2-3 x dayat home to do Scar massagedone by OT manual , mini massager and extractor on CT scar- andcontnight time cica scar pad over both scars -and isotoner glove night time and as needed during day - feels good per pt Done graston tool nr 2 this time to on volar palm and forearm and scar gentle - brushing  Extention of digits- tapping5reps each- pain with 3rd and 4th-  Cont prayer stretch  - for composite extention- pain free - 10 reps hold 5 sec  Tendon glides - stop when feeling pull- able to touch palm this date 10 reps  Pain free And Opposition to all digits- 5 reps AAROM for wrist flexion , extention 10 reps- using other hand prior to med N glide MEd N glide 5 reps  Ice massage over 3rd and 4th A1 pulley's several times during day - ice at end of session          OT Education - 10/18/20 1120    Education Details progress and ionto use    Person(s) Educated Patient    Methods Explanation;Demonstration;Tactile cues;Verbal cues;Handout    Comprehension Verbal cues required;Returned demonstration;Verbalized understanding            OT Short Term Goals - 09/30/20 1414      OT SHORT TERM GOAL #1   Title Pt to be independent in HEP  to decrease edema , scar tissue and increase AROM to touch palm without increase symptoms     Baseline decrease MC 70-75 flexion, decrease extention - edema and stiffness - tenderness over  A1pulley of 4th - scar itssue - triggering of 4th with composite fist    Time 3    Period Weeks    Status New    Target Date 10/21/20             OT Long Term Goals - 09/30/20 1415      OT LONG TERM GOAL #1   Title Pt to show increase AROM in R dominant hand to WNL to have no increase symptoms and use hand to carry more than 5 lbs without increase symptoms    Baseline Pt do not carry or lift objects more than 2 lbs with R hand - decrease flexion , increase scar tissue , decrease extention of 3rd digit- triggering with composite fist on 4th digit    Time 4    Period Weeks    Status New    Target Date 10/28/20      OT LONG TERM GOAL #2   Title R grip and prehension strength increase to in range for her age withou increase symptoms    Baseline NT - pain and edema , stiffness - decrease AROM - 7 wks s/p - Grip to increase more than 33 lbs , lat and 3 point more than 8-9 lbs    Time 6    Period Weeks    Status New    Target Date 11/11/20      OT LONG TERM GOAL #3   Title Pt return to use hand in all ADL's and IADL's prior to onset of CT symptoms    Baseline Not using  hand - report mod to severe difficulty using R dominant hand still -    Time 6    Period Weeks    Status New    Target Date 11/11/20                 Plan - 10/18/20 1121    Clinical Impression Statement Pt is about 9 1/2 wks s/p R endoscopy R CTR and 3rd trigger finger release - pt show increase AROM , scar tissue- report most of tightness and stiffnes over 3rd adn 4th MC- tender over A1pulley of 4th - did get verbal order from Dr Roland Rack or ionto with dexamethazone on 19th Jan - prehension strength WFL - grip decrease because of composite fist still decrease    OT Occupational Profile and History Problem Focused Assessment - Including review of records relating to presenting problem    Occupational performance  deficits (Please refer to evaluation for details): ADL's;IADL's;Leisure;Play    Body Structure / Function / Physical Skills Flexibility;UE functional use;Pain;Edema;IADL;ADL    Rehab Potential Fair    Clinical Decision Making Limited treatment options, no task modification necessary    Comorbidities Affecting Occupational Performance: May have comorbidities impacting occupational performance    Modification or Assistance to Complete Evaluation  No modification of tasks or assist necessary to complete eval    OT Frequency 2x / week    OT Duration 6 weeks    OT Treatment/Interventions Self-care/ADL training;Contrast Bath;Manual Therapy;Patient/family education;Therapeutic exercise;Cryotherapy;Iontophoresis;Paraffin;Fluidtherapy;Ultrasound;Passive range of motion;Scar mobilization;Splinting    Plan assess progress with HEP and change HEP as needed    OT Home Exercise Plan see pt instruction    Consulted and Agree with Plan of Care Patient  Patient will benefit from skilled therapeutic intervention in order to improve the following deficits and impairments:   Body Structure / Function / Physical Skills: Flexibility,UE functional use,Pain,Edema,IADL,ADL       Visit Diagnosis: Pain in joint of right hand  Stiffness of right hand, not elsewhere classified  Scar tissue  Stiffness of right wrist, not elsewhere classified  Pain in joint of left hand  Stiffness of left hand, not elsewhere classified  Localized edema    Problem List Patient Active Problem List   Diagnosis Date Noted  . Hypercalcemia 03/20/2018  . Osteopenia of neck of left femur 12/20/2017  . Breast cancer, right ([place]) 11/18/2017    Rosalyn Gess OTR/L,CLT 10/18/2020, 11:26 AM  [facility] [address] [place], [address] Phone: [phone]   Fax:  [phone]  Name: Brianna Rogers MRN: [id number] Date of Birth:  [birth year]

## 2020-10-21 ENCOUNTER — Ambulatory Visit: Payer: Medicare Other | Admitting: Occupational Therapy

## 2020-10-21 ENCOUNTER — Other Ambulatory Visit: Payer: Self-pay

## 2020-10-21 DIAGNOSIS — M25641 Stiffness of right hand, not elsewhere classified: Secondary | ICD-10-CM

## 2020-10-21 DIAGNOSIS — M25631 Stiffness of right wrist, not elsewhere classified: Secondary | ICD-10-CM

## 2020-10-21 DIAGNOSIS — L905 Scar conditions and fibrosis of skin: Secondary | ICD-10-CM

## 2020-10-21 DIAGNOSIS — R600 Localized edema: Secondary | ICD-10-CM | POA: Diagnosis not present

## 2020-10-21 DIAGNOSIS — M25541 Pain in joints of right hand: Secondary | ICD-10-CM

## 2020-10-21 NOTE — Therapy (Signed)
[facility] [address], [place], [address] Phone: [phone]   Fax:  [phone]  Occupational Therapy Treatment  Patient Details  Name: Brianna Rogers MRN: [id number] Date of Birth: August 09, 1946 Referring Provider (OT): Dr Joice Lofts   Encounter Date: 10/21/2020   OT End of Session - 10/21/20 0916    Visit Number 6    Number of Visits 12    Date for OT Re-Evaluation 11/11/20    OT Start Time 0815    OT Stop Time 0910    OT Time Calculation (min) 55 min    Activity Tolerance Patient tolerated treatment well    Behavior During Therapy [facility] for tasks assessed/performed           Past Medical History:  Diagnosis Date  . Breast cancer ([facility])   . Breast cancer, right ([facility]) 11/18/2017  . Diabetes mellitus without complication ([facility])   . Heart murmur   . Hypertension   . Lichen sclerosus et atrophicus   . Personal history of radiation therapy     Past Surgical History:  Procedure Laterality Date  . ABDOMINAL HYSTERECTOMY    . APPENDECTOMY    . BREAST BIOPSY Right 11/18/2017   INVASIVE MAMMARY CARCINOMA.   Marland Kitchen BREAST LUMPECTOMY Right 12/06/2017   invasive mammary carcinoma DCIS LCIS  with RAD  . CARPAL TUNNEL RELEASE Right 08/13/2020   Procedure: CARPAL TUNNEL RELEASE ENDOSCOPIC WITH RELEASE OF RIGHT LONG TRIGGER FINGER;  Surgeon: Christena Flake, MD;  Location: [facility] ORS;  Service: Orthopedics;  Laterality: Right;  . COLONOSCOPY    . COLONOSCOPY WITH PROPOFOL N/A 11/15/2017   Procedure: COLONOSCOPY WITH PROPOFOL;  Surgeon: Christena Deem, MD;  Location: [facility] ENDOSCOPY;  Service: Endoscopy;  Laterality: N/A;  . PARTIAL MASTECTOMY WITH NEEDLE LOCALIZATION Right 12/06/2017   Procedure: PARTIAL MASTECTOMY WITH NEEDLE LOCALIZATION;  Surgeon: Carolan Shiver, MD;  Location: [facility] ORS;  Service: General;  Laterality: Right;  . SENTINEL NODE BIOPSY Right 12/06/2017   Procedure: SENTINEL NODE BIOPSY;  Surgeon:  Carolan Shiver, MD;  Location: [facility] ORS;  Service: General;  Laterality: Right;  . SEPTOPLASTY    . TUBAL LIGATION      There were no vitals filed for this visit.   Subjective Assessment - 10/21/20 0915    Subjective  Stiffness still mostly in the middle and ring finger and numbness in the middle finger    Pertinent History 08/13/2020 had R endoscopy CTR and 3rd digit trigger finger release - done by Dr Joice Lofts - refer to OT for eval and tx - cont stiffness, discomfort, stiffness , scar tissue and edema    Patient Stated Goals Want to get my R hand better so I can use it - want to be able to make fist , use it to work out and do things around the house , get numbness better    Currently in Pain? No/denies             per pt stiffness and edema still mostly on 3rd and 4th MC's - and numbness on volar 3rd  Tenderness better than last week on 4th A1pulley         skin check done prior and afterwards - no issues -tolerating well    OT Treatments/Exercises (OP) - 10/21/20 0001      Iontophoresis   Type of Iontophoresis Dexamethasone    Location A1pulley of 4th R    Dose small patch, 2.0 current,  Time 19 min      RUE Fluidotherapy   Number Minutes Fluidotherapy 8 Minutes    RUE Fluidotherapy Location Hand;Wrist    Comments AROM for wrist/hand in all planes           Contrast 2-3 x dayat home to do Scar massagedone by OT manual , mini massager and extractor on CT scar- andcontnight time cica scar pad over both scars -and isotoner glove night time and as needed during day - feels good per pt Done graston tool nr 2 this time to on volar  3rd digit, palmand forearm - brushing and sweeping  Rolling over foam roller for massage on volar digits and scar prior to  Extention of digits- tapping5reps each Cont prayer stretch  - for composite extention- pain free - 10 reps hold 5 sec Tendon glides - stop when feeling pull- able to touch palm this date 10 reps   Pain free And Opposition to all digits- 5 reps AAROM for wrist flexion , extention 10 reps- using other hand prior to med N glide MEd N glide 5 reps  Ice massage over 3rd and 4th A1 pulley's several times during day - ice at end of session         OT Education - 10/21/20 0916    Education Details progress and ionto use    Person(s) Educated Patient    Methods Explanation;Demonstration;Tactile cues;Verbal cues;Handout    Comprehension Verbal cues required;Returned demonstration;Verbalized understanding            OT Short Term Goals - 09/30/20 1414      OT SHORT TERM GOAL #1   Title Pt to be independent in HEP to decrease edema , scar tissue and increase AROM to touch palm without increase symptoms    Baseline decrease MC 70-75 flexion, decrease extention - edema and stiffness - tenderness over  A1pulley of 4th - scar itssue - triggering of 4th with composite fist    Time 3    Period Weeks    Status New    Target Date 10/21/20             OT Long Term Goals - 09/30/20 1415      OT LONG TERM GOAL #1   Title Pt to show increase AROM in R dominant hand to WNL to have no increase symptoms and use hand to carry more than 5 lbs without increase symptoms    Baseline Pt do not carry or lift objects more than 2 lbs with R hand - decrease flexion , increase scar tissue , decrease extention of 3rd digit- triggering with composite fist on 4th digit    Time 4    Period Weeks    Status New    Target Date 10/28/20      OT LONG TERM GOAL #2   Title R grip and prehension strength increase to in range for her age withou increase symptoms    Baseline NT - pain and edema , stiffness - decrease AROM - 7 wks s/p - Grip to increase more than 33 lbs , lat and 3 point more than 8-9 lbs    Time 6    Period Weeks    Status New    Target Date 11/11/20      OT LONG TERM GOAL #3   Title Pt return to use hand in all ADL's and IADL's prior to onset of CT symptoms    Baseline Not using   hand - report mod  to severe difficulty using R dominant hand still -    Time 6    Period Weeks    Status New    Target Date 11/11/20                 Plan - 10/21/20 0917    Clinical Impression Statement Pt is about 10 wks s/p R endoscopy R CTR and 3rd trigger finger release- pt making great progress but still report stiffness/edema mostly now in 29rd [facility] and then 4th MC -where she is tender over A1pulley at the 4th - cont to have numbness in 3rd digit - done 2nd session of ionto this date on 4th    OT Occupational Profile and History Problem Focused Assessment - Including review of records relating to presenting problem    Occupational performance deficits (Please refer to evaluation for details): ADL's;IADL's;Leisure;Play    Body Structure / Function / Physical Skills Flexibility;UE functional use;Pain;Edema;IADL;ADL    Rehab Potential Fair    Clinical Decision Making Limited treatment options, no task modification necessary    Comorbidities Affecting Occupational Performance: May have comorbidities impacting occupational performance    Modification or Assistance to Complete Evaluation  No modification of tasks or assist necessary to complete eval    OT Frequency 2x / week    OT Duration 6 weeks    OT Treatment/Interventions Self-care/ADL training;Contrast Bath;Manual Therapy;Patient/family education;Therapeutic exercise;Cryotherapy;Iontophoresis;Paraffin;Fluidtherapy;Ultrasound;Passive range of motion;Scar mobilization;Splinting    Plan assess progress with HEP and change HEP as needed    OT Home Exercise Plan see pt instruction    Consulted and Agree with Plan of Care Patient           Patient will benefit from skilled therapeutic intervention in order to improve the following deficits and impairments:   Body Structure / Function / Physical Skills: Flexibility,UE functional use,Pain,Edema,IADL,ADL       Visit Diagnosis: Pain in joint of right hand  Stiffness of right  hand, not elsewhere classified  Scar tissue  Stiffness of right wrist, not elsewhere classified  Localized edema    Problem List Patient Active Problem List   Diagnosis Date Noted  . Hypercalcemia 03/20/2018  . Osteopenia of neck of left femur 12/20/2017  . Breast cancer, right ([place]) 11/18/2017    Rosalyn Gess OTR/,CLT 10/21/2020, 9:21 AM  [facility] [address], [place], [address] Phone: [phone]   Fax:  [phone]  Name: Brianna Rogers MRN: [id number] Date of Birth: November 18, 1945

## 2020-10-25 ENCOUNTER — Other Ambulatory Visit: Payer: Self-pay

## 2020-10-25 ENCOUNTER — Ambulatory Visit: Payer: Medicare Other | Attending: Surgery | Admitting: Occupational Therapy

## 2020-10-25 DIAGNOSIS — M25541 Pain in joints of right hand: Secondary | ICD-10-CM | POA: Insufficient documentation

## 2020-10-25 DIAGNOSIS — M25641 Stiffness of right hand, not elsewhere classified: Secondary | ICD-10-CM | POA: Insufficient documentation

## 2020-10-25 DIAGNOSIS — M25631 Stiffness of right wrist, not elsewhere classified: Secondary | ICD-10-CM | POA: Insufficient documentation

## 2020-10-25 DIAGNOSIS — L905 Scar conditions and fibrosis of skin: Secondary | ICD-10-CM | POA: Diagnosis present

## 2020-10-25 DIAGNOSIS — R600 Localized edema: Secondary | ICD-10-CM | POA: Diagnosis present

## 2020-10-25 NOTE — Therapy (Signed)
[facility] [address] [place], [address] Phone: [phone]   Fax:  [phone]  Occupational Therapy Treatment  Patient Details  Name: Brianna Rogers MRN: [id number] Date of Birth: [birth year] Referring Provider (OT): Dr Roland Rack   Encounter Date: 10/25/2020   OT End of Session - 10/25/20 1016    Visit Number 7    Number of Visits 12    Date for OT Re-Evaluation 11/11/20    OT Start Time 0831    OT Stop Time 0913    OT Time Calculation (min) 42 min    Activity Tolerance Patient tolerated treatment well    Behavior During Therapy [facility] for tasks assessed/performed           Past Medical History:  Diagnosis Date  . Breast cancer ([place])   . Breast cancer, right ([place]) 11/18/2017  . Diabetes mellitus without complication ([place])   . Heart murmur   . Hypertension   . Lichen sclerosus et atrophicus   . Personal history of radiation therapy     Past Surgical History:  Procedure Laterality Date  . ABDOMINAL HYSTERECTOMY    . APPENDECTOMY    . BREAST BIOPSY Right 11/18/2017   INVASIVE MAMMARY CARCINOMA.   Marland Kitchen BREAST LUMPECTOMY Right 12/06/2017   invasive mammary carcinoma DCIS LCIS  with RAD  . CARPAL TUNNEL RELEASE Right 08/13/2020   Procedure: CARPAL TUNNEL RELEASE ENDOSCOPIC WITH RELEASE OF RIGHT LONG TRIGGER FINGER;  Surgeon: Corky Mull, MD;  Location: [facility] ORS;  Service: Orthopedics;  Laterality: Right;  . COLONOSCOPY    . COLONOSCOPY WITH PROPOFOL N/A 11/15/2017   Procedure: COLONOSCOPY WITH PROPOFOL;  Surgeon: Lollie Sails, MD;  Location: [facility] ENDOSCOPY;  Service: Endoscopy;  Laterality: N/A;  . PARTIAL MASTECTOMY WITH NEEDLE LOCALIZATION Right 12/06/2017   Procedure: PARTIAL MASTECTOMY WITH NEEDLE LOCALIZATION;  Surgeon: Herbert Pun, MD;  Location: [facility] ORS;  Service: General;  Laterality: Right;  . SENTINEL NODE BIOPSY Right 12/06/2017   Procedure: SENTINEL NODE BIOPSY;  Surgeon:  Herbert Pun, MD;  Location: [facility] ORS;  Service: General;  Laterality: Right;  . SEPTOPLASTY    . TUBAL LIGATION      There were no vitals filed for this visit.   Subjective Assessment - 10/25/20 1006    Subjective  Doing better - stiffness still in the middle and ring finger -but I can tell my ring finger is better since we did the medication patch    Pertinent History 08/13/2020 had R endoscopy CTR and 3rd digit trigger finger release - done by Dr Roland Rack - refer to OT for eval and tx - cont stiffness, discomfort, stiffness , scar tissue and edema    Patient Stated Goals Want to get my R hand better so I can use it - want to be able to make fist , use it to work out and do things around the house , get numbness better    Currently in Pain? Yes    Pain Score 4     Pain Location Hand    Pain Orientation Right    Pain Descriptors / Indicators Tender   A1pulley   Pain Type Acute pain    Pain Onset More than a month ago    Pain Frequency Intermittent            Cont to show decrease composite fist in 3rd and 4th - stiffness only in those 2 - but decrease pain per pt since  last ionto And decrease stiffness over all in hand  Tightness still in composite extention with wrist including  Add to HEP AAROM /PROM for wrist with palm on table - elbow extention - 10 reps  Tolerate well            skin check done - no issues - prior and afterwards   OT Treatments/Exercises (OP) - 10/25/20 0001      Iontophoresis   Type of Iontophoresis Dexamethasone    Location A1pulley of 4th R    Dose small patch, 2.0 current,    Time 19 min      RUE Fluidotherapy   Number Minutes Fluidotherapy 8 Minutes    RUE Fluidotherapy Location Hand;Wrist    Comments AROM for digits and wrist           Contrast 2-3 x dayat home to do Scar massagedone by OT manual , mini massager and extractoron CT scar- andcontnight time cica scar pad over both scars -and isotoner glove night time and  as needed during day - feels good per pt -still swelling and fibrosis in 3rd digit Done graston tool nr 2 this time to on volar  3rd digit, palmand forearm - brushing and sweeping  Rolling over foam roller for massage on volar digits and scar prior to  Extention of digits- tapping5reps each  Tendon glides - stop when feeling pull- able to touch palm this datewith more ease 10 reps  Pain free And Opposition to all digits- 5 reps AAROM for wrist flexion , extention 10 reps- using other hand prior to med N glide MEd N glide 5 reps  Ice massage over 3rd and 4th A1 pulley's several times during day - ice at end of session          OT Education - 10/25/20 1016    Education Details progress and ionto use    Person(s) Educated Patient    Methods Explanation;Demonstration;Tactile cues;Verbal cues;Handout    Comprehension Verbal cues required;Returned demonstration;Verbalized understanding            OT Short Term Goals - 09/30/20 1414      OT SHORT TERM GOAL #1   Title Pt to be independent in HEP to decrease edema , scar tissue and increase AROM to touch palm without increase symptoms    Baseline decrease MC 70-75 flexion, decrease extention - edema and stiffness - tenderness over  A1pulley of 4th - scar itssue - triggering of 4th with composite fist    Time 3    Period Weeks    Status New    Target Date 10/21/20             OT Long Term Goals - 09/30/20 1415      OT LONG TERM GOAL #1   Title Pt to show increase AROM in R dominant hand to WNL to have no increase symptoms and use hand to carry more than 5 lbs without increase symptoms    Baseline Pt do not carry or lift objects more than 2 lbs with R hand - decrease flexion , increase scar tissue , decrease extention of 3rd digit- triggering with composite fist on 4th digit    Time 4    Period Weeks    Status New    Target Date 10/28/20      OT LONG TERM GOAL #2   Title R grip and prehension strength  increase to in range for her age withou increase symptoms    Baseline  NT - pain and edema , stiffness - decrease AROM - 7 wks s/p - Grip to increase more than 33 lbs , lat and 3 point more than 8-9 lbs    Time 6    Period Weeks    Status New    Target Date 11/11/20      OT LONG TERM GOAL #3   Title Pt return to use hand in all ADL's and IADL's prior to onset of CT symptoms    Baseline Not using  hand - report mod to severe difficulty using R dominant hand still -    Time 6    Period Weeks    Status New    Target Date 11/11/20                 Plan - 10/25/20 1016    Clinical Impression Statement Pt is about 10 1/2 wks s/p R endoscopy R CTR and 3rd trigger finger release - pt making great progress in ROM , pain , stiffness and scar tissue - but cont to have stiffness in composite extention , composite flexion 3rd , tenderness on A1pulley of 4th - pt started working out in her gym using some equipment - gripping    OT Occupational Profile and History Problem Focused Assessment - Including review of records relating to presenting problem    Occupational performance deficits (Please refer to evaluation for details): ADL's;IADL's;Leisure;Play    Body Structure / Function / Physical Skills Flexibility;UE functional use;Pain;Edema;IADL;ADL    Rehab Potential Fair    Clinical Decision Making Limited treatment options, no task modification necessary    Comorbidities Affecting Occupational Performance: May have comorbidities impacting occupational performance    Modification or Assistance to Complete Evaluation  No modification of tasks or assist necessary to complete eval    OT Frequency 2x / week    OT Duration 6 weeks    OT Treatment/Interventions Self-care/ADL training;Contrast Bath;Manual Therapy;Patient/family education;Therapeutic exercise;Cryotherapy;Iontophoresis;Paraffin;Fluidtherapy;Ultrasound;Passive range of motion;Scar mobilization;Splinting    Plan assess progress with HEP and  change HEP as needed    OT Home Exercise Plan see pt instruction    Consulted and Agree with Plan of Care Patient           Patient will benefit from skilled therapeutic intervention in order to improve the following deficits and impairments:   Body Structure / Function / Physical Skills: Flexibility,UE functional use,Pain,Edema,IADL,ADL       Visit Diagnosis: Stiffness of right hand, not elsewhere classified  Scar tissue  Stiffness of right wrist, not elsewhere classified  Localized edema  Pain in joint of right hand    Problem List Patient Active Problem List   Diagnosis Date Noted  . Hypercalcemia 03/20/2018  . Osteopenia of neck of left femur 12/20/2017  . Breast cancer, right ([place]) 11/18/2017    Rosalyn Gess OTR/L,CLT 10/25/2020, 10:19 AM  [facility] [address], [place], [address] Phone: [phone]   Fax:  [phone]  Name: Teondra Langin MRN: [id number] Date of Birth: 1945/12/04

## 2020-10-29 ENCOUNTER — Other Ambulatory Visit: Payer: Self-pay

## 2020-10-29 ENCOUNTER — Ambulatory Visit: Payer: Medicare Other | Admitting: Occupational Therapy

## 2020-10-29 DIAGNOSIS — L905 Scar conditions and fibrosis of skin: Secondary | ICD-10-CM

## 2020-10-29 DIAGNOSIS — M25641 Stiffness of right hand, not elsewhere classified: Secondary | ICD-10-CM | POA: Diagnosis not present

## 2020-10-29 DIAGNOSIS — M25541 Pain in joints of right hand: Secondary | ICD-10-CM

## 2020-10-29 DIAGNOSIS — M25631 Stiffness of right wrist, not elsewhere classified: Secondary | ICD-10-CM

## 2020-10-29 DIAGNOSIS — R600 Localized edema: Secondary | ICD-10-CM

## 2020-10-29 NOTE — Therapy (Signed)
[facility] [address], [place], [address] Phone: [phone]   Fax:  [phone]  Occupational Therapy Treatment  Patient Details  Name: Brianna Rogers MRN: [id number] Date of Birth: 09-25-1945 Referring Provider (OT): Dr Roland Rack   Encounter Date: 10/29/2020   OT End of Session - 10/29/20 0831    Visit Number 8    Number of Visits 12    Date for OT Re-Evaluation 11/11/20    OT Start Time 0816    OT Stop Time 0920    OT Time Calculation (min) 64 min    Activity Tolerance Patient tolerated treatment well    Behavior During Therapy [facility] for tasks assessed/performed           Past Medical History:  Diagnosis Date  . Breast cancer ([place])   . Breast cancer, right ([place]) 11/18/2017  . Diabetes mellitus without complication ([place])   . Heart murmur   . Hypertension   . Lichen sclerosus et atrophicus   . Personal history of radiation therapy     Past Surgical History:  Procedure Laterality Date  . ABDOMINAL HYSTERECTOMY    . APPENDECTOMY    . BREAST BIOPSY Right 11/18/2017   INVASIVE MAMMARY CARCINOMA.   Marland Kitchen BREAST LUMPECTOMY Right 12/06/2017   invasive mammary carcinoma DCIS LCIS  with RAD  . CARPAL TUNNEL RELEASE Right 08/13/2020   Procedure: CARPAL TUNNEL RELEASE ENDOSCOPIC WITH RELEASE OF RIGHT LONG TRIGGER FINGER;  Surgeon: Corky Mull, MD;  Location: [facility] ORS;  Service: Orthopedics;  Laterality: Right;  . COLONOSCOPY    . COLONOSCOPY WITH PROPOFOL N/A 11/15/2017   Procedure: COLONOSCOPY WITH PROPOFOL;  Surgeon: Lollie Sails, MD;  Location: [facility] ENDOSCOPY;  Service: Endoscopy;  Laterality: N/A;  . PARTIAL MASTECTOMY WITH NEEDLE LOCALIZATION Right 12/06/2017   Procedure: PARTIAL MASTECTOMY WITH NEEDLE LOCALIZATION;  Surgeon: Herbert Pun, MD;  Location: [facility] ORS;  Service: General;  Laterality: Right;  . SENTINEL NODE BIOPSY Right 12/06/2017   Procedure: SENTINEL NODE BIOPSY;  Surgeon:  Herbert Pun, MD;  Location: [facility] ORS;  Service: General;  Laterality: Right;  . SEPTOPLASTY    . TUBAL LIGATION      There were no vitals filed for this visit.   Subjective Assessment - 10/29/20 0829    Subjective  Cleaned the house yesterday - was little sore but not bad - more discomfort - put new cat tree together    Pertinent History 08/13/2020 had R endoscopy CTR and 3rd digit trigger finger release - done by Dr Roland Rack - refer to OT for eval and tx - cont stiffness, discomfort, stiffness , scar tissue and edema    Patient Stated Goals Want to get my R hand better so I can use it - want to be able to make fist , use it to work out and do things around the house , get numbness better    Currently in Pain? Yes    Pain Score 1     Pain Location Hand    Pain Orientation Right    Pain Descriptors / Indicators Discomfort    Pain Type Acute pain    Pain Onset More than a month ago    Pain Frequency Intermittent              [facility] OT Assessment - 10/29/20 0001      AROM   Right Wrist Extension 70 Degrees   in session  62 coming in for wrist extention    Cont to show decrease composite fist in 3rd and 4th - stiffness only in those 2 - but decrease pain and using them a lot yesterday and somewhat sore And decrease stiffness over all in hand  Tightness still in composite extention with wrist including  Pt hard time with PROM for wrist with palm on table -wrist extention - 10 reps  Change and review with pt table slides on towel for composite digits and wrist extention - done well -and tolerated well - pt to do 20 reps prior to med N glide          skin check done - no issues and pt to wear patch afterwards for hour    OT Treatments/Exercises (OP) - 10/29/20 0001      Iontophoresis   Type of Iontophoresis Dexamethasone    Location A1pulley of 4th R    Dose small patch, 2.0 current,    Time 19 min      RUE Fluidotherapy   Number Minutes Fluidotherapy  8 Minutes    RUE Fluidotherapy Location Hand;Wrist    Comments AROM for digis and wrist to decrease stiffness            Contrast 2-3 x dayat home prior to HEP Scar massagedone by OT manual , mini massager and extractoron CT scar- andcontnight time cica scar pad over both scars -and isotoner glove night time and as needed during day - feels good per pt - this date still swelling and fibrosis in 3rd digit Done graston tool nr 2 this time to on volar3rd digit,palmand forearm - brushing and sweeping  Rolling over foam roller for massage on volar digits and scar prior to Extention of digits- tapping5reps each  Tendon glides - stop when feeling pull- able to touch palm this datewith more ease 10 reps  Pain free And Opposition to all digits- 5 reps Table slides for wrist and digits extention prior to med N glide MEd N glide 5 reps  Ice massage over 3rd and 4th A1 pulley's several times during day if needed          OT Education - 10/29/20 0831    Education Details progress and ionto use    Person(s) Educated Patient    Methods Explanation;Demonstration;Tactile cues;Verbal cues;Handout    Comprehension Verbal cues required;Returned demonstration;Verbalized understanding            OT Short Term Goals - 09/30/20 1414      OT SHORT TERM GOAL #1   Title Pt to be independent in HEP to decrease edema , scar tissue and increase AROM to touch palm without increase symptoms    Baseline decrease MC 70-75 flexion, decrease extention - edema and stiffness - tenderness over  A1pulley of 4th - scar itssue - triggering of 4th with composite fist    Time 3    Period Weeks    Status New    Target Date 10/21/20             OT Long Term Goals - 09/30/20 1415      OT LONG TERM GOAL #1   Title Pt to show increase AROM in R dominant hand to WNL to have no increase symptoms and use hand to carry more than 5 lbs without increase symptoms    Baseline Pt do not carry or  lift objects more than 2 lbs with R hand - decrease flexion , increase scar tissue , decrease  extention of 3rd digit- triggering with composite fist on 4th digit    Time 4    Period Weeks    Status New    Target Date 10/28/20      OT LONG TERM GOAL #2   Title R grip and prehension strength increase to in range for her age withou increase symptoms    Baseline NT - pain and edema , stiffness - decrease AROM - 7 wks s/p - Grip to increase more than 33 lbs , lat and 3 point more than 8-9 lbs    Time 6    Period Weeks    Status New    Target Date 11/11/20      OT LONG TERM GOAL #3   Title Pt return to use hand in all ADL's and IADL's prior to onset of CT symptoms    Baseline Not using  hand - report mod to severe difficulty using R dominant hand still -    Time 6    Period Weeks    Status New    Target Date 11/11/20                 Plan - 10/29/20 1962    Clinical Impression Statement Pt is about 11 wks s/p R endoscopy R CTR and 3rd digit trigger finger release - pt using hand more at home reported - has some soreness in hand - but digits flexion increase to 3rd and 4th MC 85 and PIP's 90 to 95 degrees- still tender over 4th A1pulley - but increase use - did add some weight bearing this date for pt to address at the same time composite extention    OT Occupational Profile and History Problem Focused Assessment - Including review of records relating to presenting problem    Occupational performance deficits (Please refer to evaluation for details): ADL's;IADL's;Leisure;Play    Body Structure / Function / Physical Skills Flexibility;UE functional use;Pain;Edema;IADL;ADL    Rehab Potential Fair    Clinical Decision Making Limited treatment options, no task modification necessary    Comorbidities Affecting Occupational Performance: May have comorbidities impacting occupational performance    Modification or Assistance to Complete Evaluation  No modification of tasks or assist necessary  to complete eval    OT Frequency 2x / week    OT Duration 6 weeks    OT Treatment/Interventions Self-care/ADL training;Contrast Bath;Manual Therapy;Patient/family education;Therapeutic exercise;Cryotherapy;Iontophoresis;Paraffin;Fluidtherapy;Ultrasound;Passive range of motion;Scar mobilization;Splinting    Plan assess progress with HEP and change HEP as needed    OT Home Exercise Plan see pt instruction    Consulted and Agree with Plan of Care Patient           Patient will benefit from skilled therapeutic intervention in order to improve the following deficits and impairments:   Body Structure / Function / Physical Skills: Flexibility,UE functional use,Pain,Edema,IADL,ADL       Visit Diagnosis: Stiffness of right hand, not elsewhere classified  Scar tissue  Stiffness of right wrist, not elsewhere classified  Localized edema  Pain in joint of right hand    Problem List Patient Active Problem List   Diagnosis Date Noted  . Hypercalcemia 03/20/2018  . Osteopenia of neck of left femur 12/20/2017  . Breast cancer, right ([place]) 11/18/2017    Rosalyn Gess OTR/L,CLT 10/29/2020, 10:01 AM  [facility] [address], [place], [address] Phone: [phone]   Fax:  [phone]  Name: Brianna Rogers MRN: [id number] Date of  Birth: January 02, 1946

## 2020-10-31 ENCOUNTER — Ambulatory Visit
Admission: RE | Admit: 2020-10-31 | Discharge: 2020-10-31 | Disposition: A | Discharge status: Home / Self Care | Payer: Medicare Other | Source: Ambulatory Visit | Attending: Hematology and Oncology | Admitting: Hematology and Oncology

## 2020-10-31 ENCOUNTER — Other Ambulatory Visit: Payer: Self-pay

## 2020-10-31 DIAGNOSIS — C50911 Malignant neoplasm of unspecified site of right female breast: Secondary | ICD-10-CM | POA: Insufficient documentation

## 2020-10-31 DIAGNOSIS — Z08 Encounter for follow-up examination after completed treatment for malignant neoplasm: Secondary | ICD-10-CM | POA: Insufficient documentation

## 2020-10-31 DIAGNOSIS — Z1231 Encounter for screening mammogram for malignant neoplasm of breast: Secondary | ICD-10-CM | POA: Insufficient documentation

## 2020-10-31 DIAGNOSIS — C50411 Malignant neoplasm of upper-outer quadrant of right female breast: Secondary | ICD-10-CM

## 2020-10-31 DIAGNOSIS — Z170 Estrogen receptor positive status [ER+]: Secondary | ICD-10-CM

## 2020-10-31 DIAGNOSIS — Z853 Personal history of malignant neoplasm of breast: Secondary | ICD-10-CM | POA: Insufficient documentation

## 2020-10-31 NOTE — Progress Notes (Signed)
[facility]  [address], [address] [place], [place] [address] Phone: [phone]  Fax: [phone]   Clinic Day:  11/04/2020  Referring physician: Sallee Lange, *  Chief Complaint: Brianna Rogers is a 75 y.o. female with stage IA right breast cancer and osteopenia who is seen for 6 month assessment.  HPI: The patient was last seen in the medical oncology clinic on 05/01/2020 by Faythe Casa, NP. At that time, she felt "good". She was using a lymphedema bra, which had helped significantly. She had carpal tunnel. She denied any dental concerns. Exam was stable. Hematocrit was 36.2, hemoglobin 12.5, platelets 210,000, WBC 7,700. Glucose was 256. CA27.29 was 15.7. She continued Femara, calcium, and vitamin D. She received Prolia.  The patient saw Dr. Baruch Gouty on 06/12/2020. She was doing well with no evidence of disease. She was tolerating Femara well. Follow-up was planned for 1 year.  The patient underwent right carpal tunnel release on 08/13/2020 by Dr. Roland Rack.  Diagnostic bilateral mammogram on 10/31/2020 revealed no mammographic evidence of malignancy in either breast.  Screening mammogram in 1 year was recommended.  During the interim, she has been "good." She is in physical therapy s/p carpal tunnel surgery.  She performs breast self exams every night; she will decrease to once per month. She wears a compression bra, which helps.   Past Medical History:  Diagnosis Date  . Breast cancer ([place])   . Breast cancer, right ([place]) 11/18/2017  . Diabetes mellitus without complication ([place])   . Heart murmur   . Hypertension   . Lichen sclerosus et atrophicus   . Personal history of radiation therapy     Past Surgical History:  Procedure Laterality Date  . ABDOMINAL HYSTERECTOMY    . APPENDECTOMY    . BREAST BIOPSY Right 11/18/2017   INVASIVE MAMMARY CARCINOMA.   Marland Kitchen BREAST LUMPECTOMY Right 12/06/2017   invasive mammary carcinoma DCIS LCIS  with  RAD  . CARPAL TUNNEL RELEASE Right 08/13/2020   Procedure: CARPAL TUNNEL RELEASE ENDOSCOPIC WITH RELEASE OF RIGHT LONG TRIGGER FINGER;  Surgeon: Corky Mull, MD;  Location: [facility] ORS;  Service: Orthopedics;  Laterality: Right;  . COLONOSCOPY    . COLONOSCOPY WITH PROPOFOL N/A 11/15/2017   Procedure: COLONOSCOPY WITH PROPOFOL;  Surgeon: Lollie Sails, MD;  Location: [facility] ENDOSCOPY;  Service: Endoscopy;  Laterality: N/A;  . PARTIAL MASTECTOMY WITH NEEDLE LOCALIZATION Right 12/06/2017   Procedure: PARTIAL MASTECTOMY WITH NEEDLE LOCALIZATION;  Surgeon: Herbert Pun, MD;  Location: [facility] ORS;  Service: General;  Laterality: Right;  . SENTINEL NODE BIOPSY Right 12/06/2017   Procedure: SENTINEL NODE BIOPSY;  Surgeon: Herbert Pun, MD;  Location: [facility] ORS;  Service: General;  Laterality: Right;  . SEPTOPLASTY    . TUBAL LIGATION      Family History  Problem Relation Age of Onset  . Breast cancer Mother 23  . Cancer Mother   . Cancer Maternal Aunt   . Cancer Maternal Grandmother     Social History:  reports that she has never smoked. She has never used smokeless tobacco. She reports current alcohol use. She reports that she does not use drugs.  She is an only child. Her mother passed away in 29-Jan-2019. She no longer has to travel to [facility] to take care of her animals. She lives in [place]. Patient is retired from [employer]. The patient is alone today.  Allergies:  Allergies  Allergen Reactions  . Alendronate Itching  . Bactrim [Sulfamethoxazole-Trimethoprim]  Other (See Comments)    Unknown  . Empagliflozin Itching  . Macrodantin [Nitrofurantoin Macrocrystal] Other (See Comments)    Unknown  . Sulfa Antibiotics Other (See Comments)    Flu like symptoms    Current Medications: Current Outpatient Medications  Medication Sig Dispense Refill  . amLODipine (NORVASC) 5 MG tablet Take 5 mg by mouth daily.    Marland Kitchen aspirin EC 81 MG tablet Take 81 mg by mouth every  evening.     . B-D ULTRAFINE III SHORT PEN 31G X 8 MM MISC U UTD    . Continuous Blood Gluc Sensor (FREESTYLE LIBRE SENSOR SYSTEM) MISC Use 3 each every 10 (ten) days    . denosumab (PROLIA) 60 MG/ML SOSY injection Inject 60 mg into the skin every 6 (six) months.    . insulin lispro (HUMALOG) 100 UNIT/ML injection Inject 14 Units into the skin 2 (two) times daily before a meal.    . LANTUS SOLOSTAR 100 UNIT/ML Solostar Pen Inject 40 Units into the skin daily.    Marland Kitchen letrozole (FEMARA) 2.5 MG tablet Take 1 tablet (2.5 mg total) by mouth daily. (Patient taking differently: Take 2.5 mg by mouth every evening.) 30 tablet 0  . meloxicam (MOBIC) 15 MG tablet Take 15 mg by mouth daily.    . metFORMIN (GLUCOPHAGE) 1000 MG tablet Take 1,000 mg by mouth 2 (two) times daily with a meal.    . Multiple Vitamin (MULTIVITAMIN) tablet Take 1 tablet by mouth daily.    . Omega-3 Fatty Acids (FISH OIL) 1200 MG CAPS Take 2,400 mg by mouth 2 (two) times daily.     . quinapril (ACCUPRIL) 20 MG tablet Take 20 mg by mouth 2 (two) times daily at 10 AM and 5 PM.     . rosuvastatin (CRESTOR) 5 MG tablet Take 5 mg by mouth every other day.     . meloxicam (MOBIC) 7.5 MG tablet Take 7.5 mg by mouth daily. (Patient not taking: Reported on 11/01/2020)    . traMADol (ULTRAM) 50 MG tablet Take 1 tablet (50 mg total) by mouth every 6 (six) hours as needed for moderate pain. (Patient not taking: Reported on 11/01/2020) 20 tablet 0   No current facility-administered medications for this visit.   Facility-Administered Medications Ordered in Other Visits  Medication Dose Route Frequency Provider Last Rate Last Admin  . denosumab (PROLIA) injection 60 mg  60 mg Subcutaneous Once Lequita Asal, MD       Review of Systems  Constitutional: Negative for chills, diaphoresis, fever, malaise/fatigue and weight loss (up 1 lb).       Feels "good".  HENT: Negative.  Negative for congestion, ear pain, hearing loss, nosebleeds, sinus pain  and sore throat.   Eyes: Negative.  Negative for blurred vision and double vision.  Respiratory: Negative.  Negative for cough, hemoptysis, sputum production and shortness of breath.   Cardiovascular: Negative.  Negative for chest pain, palpitations, orthopnea, leg swelling and PND.  Gastrointestinal: Negative.  Negative for abdominal pain, blood in stool, constipation, diarrhea, heartburn, melena, nausea and vomiting.  Genitourinary: Negative.  Negative for dysuria, frequency, hematuria and urgency.  Musculoskeletal: Negative for back pain, joint pain, myalgias and neck pain.       S/p carpal tunnel surgery  Skin: Negative.  Negative for itching and rash.  Neurological: Positive for tingling (both hands) and sensory change (numbness in hands). Negative for dizziness, tremors, speech change, focal weakness, weakness and headaches.  Endo/Heme/Allergies: Does not bruise/bleed easily.  Diabetes.  Psychiatric/Behavioral: Negative.  Negative for depression and memory loss. The patient is not nervous/anxious and does not have insomnia.   All other systems reviewed and are negative.  Performance status (ECOG): 1  Vitals Blood pressure (!) 178/71, pulse 75, temperature (!) 96.5 F (35.8 C), temperature source Tympanic, resp. rate 18, weight 159 lb 9.8 oz (72.4 kg), SpO2 100 %.  Physical Exam Vitals and nursing note reviewed.  Constitutional:      General: She is not in acute distress.    Appearance: She is well-developed. She is not diaphoretic.  HENT:     Head: Normocephalic and atraumatic.     Mouth/Throat:     Mouth: Mucous membranes are moist.     Pharynx: Oropharynx is clear. No oropharyngeal exudate.  Eyes:     General: No scleral icterus.    Extraocular Movements: Extraocular movements intact.     Conjunctiva/sclera: Conjunctivae normal.     Pupils: Pupils are equal, round, and reactive to light.     Comments: Glasses.  Brown eyes.   Neck:     Vascular: No JVD.   Cardiovascular:     Rate and Rhythm: Normal rate and regular rhythm.     Heart sounds: Normal heart sounds. No murmur heard.   Pulmonary:     Effort: Pulmonary effort is normal. No respiratory distress.     Breath sounds: Normal breath sounds. No wheezing or rales.  Chest:     Chest wall: No tenderness.  Breasts:     Right: Swelling (edema inferior and laterally) present. No inverted nipple, mass, nipple discharge, skin change, tenderness or supraclavicular adenopathy.     Left: Skin change (scattered fibrocystic changes) present. No inverted nipple, mass, nipple discharge, tenderness or supraclavicular adenopathy.    Abdominal:     General: Bowel sounds are normal. There is no distension.     Palpations: Abdomen is soft. There is no mass.     Tenderness: There is no abdominal tenderness. There is no guarding or rebound.  Musculoskeletal:        General: No swelling or tenderness.     Cervical back: Normal range of motion and neck supple.  Lymphadenopathy:     Head:     Right side of head: No preauricular, posterior auricular or occipital adenopathy.     Left side of head: No preauricular, posterior auricular or occipital adenopathy.     Cervical: No cervical adenopathy.     Upper Body:     Right upper body: No supraclavicular adenopathy.     Left upper body: No supraclavicular adenopathy.     Lower Body: No right inguinal adenopathy. No left inguinal adenopathy.  Skin:    General: Skin is warm and dry.     Coloration: Skin is not pale.     Findings: No erythema or rash.  Neurological:     Mental Status: She is alert and oriented to person, place, and time.  Psychiatric:        Behavior: Behavior normal.        Thought Content: Thought content normal.        Judgment: Judgment normal.    Appointment on 11/04/2020  Component Date Value Ref Range Status  . Sodium 11/04/2020 138  135 - 145 mmol/L Final  . Potassium 11/04/2020 4.2  3.5 - 5.1 mmol/L Final  . Chloride  11/04/2020 101  98 - 111 mmol/L Final  . CO2 11/04/2020 28  22 - 32 mmol/L Final  . Glucose,  Bld 11/04/2020 145* 70 - 99 mg/dL Final   Glucose reference range applies only to samples taken after fasting for at least 8 hours.  . BUN 11/04/2020 28* 8 - 23 mg/dL Final  . Creatinine, Ser 11/04/2020 0.94  0.44 - 1.00 mg/dL Final  . Calcium 11/04/2020 10.1  8.9 - 10.3 mg/dL Final  . Total Protein 11/04/2020 7.5  6.5 - 8.1 g/dL Final  . Albumin 11/04/2020 3.8  3.5 - 5.0 g/dL Final  . AST 11/04/2020 18  15 - 41 U/L Final  . ALT 11/04/2020 21  0 - 44 U/L Final  . Alkaline Phosphatase 11/04/2020 49  38 - 126 U/L Final  . Total Bilirubin 11/04/2020 0.8  0.3 - 1.2 mg/dL Final  . GFR, Estimated 11/04/2020 >60  >60 mL/min Final   Comment: (NOTE) Calculated using the CKD-EPI Creatinine Equation (2021)   . Anion gap 11/04/2020 9  5 - 15 Final   Performed at [facility], [address] [place], [place] [address]  . WBC 11/04/2020 7.5  4.0 - 10.5 K/uL Final  . RBC 11/04/2020 4.52  3.87 - 5.11 MIL/uL Final  . Hemoglobin 11/04/2020 13.5  12.0 - 15.0 g/dL Final  . HCT 11/04/2020 39.6  36.0 - 46.0 % Final  . MCV 11/04/2020 87.6  80.0 - 100.0 fL Final  . MCH 11/04/2020 29.9  26.0 - 34.0 pg Final  . MCHC 11/04/2020 34.1  30.0 - 36.0 g/dL Final  . RDW 11/04/2020 12.7  11.5 - 15.5 % Final  . Platelets 11/04/2020 220  150 - 400 K/uL Final  . nRBC 11/04/2020 0.0  0.0 - 0.2 % Final  . Neutrophils Relative % 11/04/2020 66  % Final  . Neutro Abs 11/04/2020 5.0  1.7 - 7.7 K/uL Final  . Lymphocytes Relative 11/04/2020 20  % Final  . Lymphs Abs 11/04/2020 1.5  0.7 - 4.0 K/uL Final  . Monocytes Relative 11/04/2020 8  % Final  . Monocytes Absolute 11/04/2020 0.6  0.1 - 1.0 K/uL Final  . Eosinophils Relative 11/04/2020 4  % Final  . Eosinophils Absolute 11/04/2020 0.3  0.0 - 0.5 K/uL Final  . Basophils Relative 11/04/2020 1  % Final  . Basophils Absolute 11/04/2020 0.0  0.0 - 0.1 K/uL Final  .  Immature Granulocytes 11/04/2020 1  % Final  . Abs Immature Granulocytes 11/04/2020 0.05  0.00 - 0.07 K/uL Final   Performed at [facility], [address], [place], [place] [address]    Assessment:  Brianna Rogers is a 75 y.o. female with stage IA right breast cancers/plumpectomyon 12/06/2017. Pathology revealed a 1.7 cm grade III invasive mammary carcinoma of no special type. There were biopsy site changes and a clip present. 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 Caudal margin re-excision revealed ductal and lobular carcinoma in situ. In situ carcinoma was <0.5 mm from the margin. Thermal artifact limited interpretation. New skin margin revealed in situ carcinoma <0.5 mm from the surgical margin. There was lymphovascular invasion. One sentinel lymph node was negative. Tumor was ER + (>90%), PR + (>90%) and Her2/neu 2+ (FISH negative). Pathologic stagewas pT1cN0.  Oncotype DX revealed a recurrence score of 9 (low risk). Distant recurrencce risk at 9 years with hormonal therapy was 3%. Absolute benefit of chemotherapy was <1%.  Diagnostic right mammogram and ultrasoundon 11/11/2017 revealed a suspicious mass in the 9 o'clock position of the right breast. Targeted ultrasound revealed a 1.5 x 2.2 x 1.9 cm  mass with posterior acoustic shadowing 10 cm from the nipple. The right axilla was negative for adenopathy. Bilateral mammogramon 10/27/2018 revealed no evidence of malignancy in either breast.  Bilateral diagnostic mammogram on 10/30/2019 revealed no evidence of malignancy with expected postsurgical and post radiation changes in the right breast.  Bilateral diagnostic mammogram on 10/31/2020 revealed no mammographic evidence of malignancy in either breast.   She received radiationfrom 01/27/2018 - 03/03/2018. She began [facility] 03/15/2018.  She switched to Aromasin on 06/09/2019 and has been back on Femara.  She was off Femara from 02/13/2020 -  03/07/2020.  CA27.29 has been followed: 24.7 on 11/24/2017,22.5 on 03/15/2018, 16.0 on 10/31/2018, 22.6 on 04/05/2019, 17.4 on 08/01/2019, 21.7 on 11/02/2019, 25.6 on 11/29/2019 and 15.7 on 05/01/2020.  Bone densityon 12/09/2015 revealed osteopeniawith a T-score of -1.3 in the left hip. Bone densityon 12/14/2017 revealedosteopeniawith a T-score of -1.4 in the left femoral neck. Bone density on 12/18/2019 revealed osteopenia with a T-score of -1.4 at the left femur neck.  She began Proliaon 04/15/2018 (last 08/13/2020).  She has a family historyof breast cancer x 4 (mother and 3 paternal aunts) and "female cancer" (maternal great grandmother). [facility] genetic testingwas negative for BRCA1/2.  Symptomatically, she feels "good."  She denies any breast concerns.  Exam is stable.  Plan: 1.   Labs today: CBC with diff, CMP, CA27.29. 2.   Stage IA right breast cancer Clinically, she is doing well. Exam reveals no evidence of recurrent disease.  CA27.29 is 22.9 (normal).. Bilateral diagnostic mammogram on 10/31/2020 revealed no evidence of disease.   She is on Femara and tolerating it well. Discuss breast self-exam monthly. Continue surveillance. 3.   Osteopenia Patient last received Prolia on 08/13/2020. Bone density on 12/18/2019 confirmed osteopenia (stable).  Continue calcium and vitamin D. Continue Prolia every 6 months.  She denies any dental issues. 4.   Prolia today. 5.   Screening bilateral mammogram on 10/31/2021. 6.   RTC in 6 months for MD assess, labs (CBC with diff, CMP, CA27.29), and Prolia.  I discussed the assessment and treatment plan with the patient.  The patient was provided an opportunity to ask questions and all were answered.  The patient agreed with the plan and demonstrated an understanding of the instructions.  The patient was advised to call back if the symptoms worsen or if the condition fails to improve as anticipated.   Lequita Asal, MD,  PhD    11/04/2020, 10:01 AM  I, Mirian Mo Tufford, am acting as Education administrator for [employer]. Mike Gip, MD, PhD.  I, Dashonna Chagnon C. Mike Gip, MD, have reviewed the above documentation for accuracy and completeness, and I agree with the above.

## 2020-11-01 ENCOUNTER — Ambulatory Visit: Payer: Medicare Other | Admitting: Occupational Therapy

## 2020-11-01 ENCOUNTER — Encounter: Payer: Self-pay | Admitting: Hematology and Oncology

## 2020-11-01 DIAGNOSIS — M25631 Stiffness of right wrist, not elsewhere classified: Secondary | ICD-10-CM

## 2020-11-01 DIAGNOSIS — R600 Localized edema: Secondary | ICD-10-CM

## 2020-11-01 DIAGNOSIS — M25541 Pain in joints of right hand: Secondary | ICD-10-CM

## 2020-11-01 DIAGNOSIS — M25641 Stiffness of right hand, not elsewhere classified: Secondary | ICD-10-CM | POA: Diagnosis not present

## 2020-11-01 DIAGNOSIS — L905 Scar conditions and fibrosis of skin: Secondary | ICD-10-CM

## 2020-11-01 NOTE — Therapy (Signed)
[facility] [address] [place], [address] Phone: [phone]   Fax:  [phone]  Occupational Therapy Treatment  Patient Details  Name: Brianna Rogers MRN: [id number] Date of Birth: 1946/01/03 Referring Provider (OT): Dr Roland Rack   Encounter Date: 11/01/2020   OT End of Session - 11/01/20 1221    Visit Number 9    Number of Visits 12    Date for OT Re-Evaluation 11/11/20    OT Start Time 0835    OT Stop Time 0924    OT Time Calculation (min) 49 min    Activity Tolerance Patient tolerated treatment well    Behavior During Therapy [facility] for tasks assessed/performed           Past Medical History:  Diagnosis Date  . Breast cancer ([place])   . Breast cancer, right ([place]) 11/18/2017  . Diabetes mellitus without complication ([place])   . Heart murmur   . Hypertension   . Lichen sclerosus et atrophicus   . Personal history of radiation therapy     Past Surgical History:  Procedure Laterality Date  . ABDOMINAL HYSTERECTOMY    . APPENDECTOMY    . BREAST BIOPSY Right 11/18/2017   INVASIVE MAMMARY CARCINOMA.   Marland Kitchen BREAST LUMPECTOMY Right 12/06/2017   invasive mammary carcinoma DCIS LCIS  with RAD  . CARPAL TUNNEL RELEASE Right 08/13/2020   Procedure: CARPAL TUNNEL RELEASE ENDOSCOPIC WITH RELEASE OF RIGHT LONG TRIGGER FINGER;  Surgeon: Corky Mull, MD;  Location: [facility] ORS;  Service: Orthopedics;  Laterality: Right;  . COLONOSCOPY    . COLONOSCOPY WITH PROPOFOL N/A 11/15/2017   Procedure: COLONOSCOPY WITH PROPOFOL;  Surgeon: Lollie Sails, MD;  Location: [facility] ENDOSCOPY;  Service: Endoscopy;  Laterality: N/A;  . PARTIAL MASTECTOMY WITH NEEDLE LOCALIZATION Right 12/06/2017   Procedure: PARTIAL MASTECTOMY WITH NEEDLE LOCALIZATION;  Surgeon: Herbert Pun, MD;  Location: [facility] ORS;  Service: General;  Laterality: Right;  . SENTINEL NODE BIOPSY Right 12/06/2017   Procedure: SENTINEL NODE BIOPSY;  Surgeon:  Herbert Pun, MD;  Location: [facility] ORS;  Service: General;  Laterality: Right;  . SEPTOPLASTY    . TUBAL LIGATION      There were no vitals filed for this visit.   Subjective Assessment - 11/01/20 0846    Subjective  I had a lot of appointments yesterday and did not get chance to do my hot and cold and exercises - did a lot of massage and more tender today and tight - more than before    Pertinent History 08/13/2020 had R endoscopy CTR and 3rd digit trigger finger release - done by Dr Roland Rack - refer to OT for eval and tx - cont stiffness, discomfort, stiffness , scar tissue and edema    Patient Stated Goals Want to get my R hand better so I can use it - want to be able to make fist , use it to work out and do things around the house , get numbness better    Currently in Pain? Yes    Pain Score 1     Pain Location Hand    Pain Orientation Right    Pain Descriptors / Indicators Tightness    Pain Type Acute pain;Surgical pain    Pain Onset More than a month ago    Pain Frequency Intermittent              [facility] OT Assessment - 11/01/20 0001      Strength  Right Hand Grip (lbs) 32    Right Hand Lateral Pinch 14 lbs    Right Hand 3 Point Pinch 11 lbs    Left Hand Grip (lbs) 24    Left Hand Lateral Pinch 14 lbs    Left Hand 3 Point Pinch 12 lbs           Pt report increase stiffness this date and soreness over palm - done a lot of scar massage yesterday with sitting at appointments Done contrast only one time Per pt - if she does something - so over do things most of the time        skin check done - no issues and pt to wear patch afterwards for hour    OT Treatments/Exercises (OP) - 11/01/20 0001      Iontophoresis   Type of Iontophoresis Dexamethasone    Location A1pulley of 4th R    Dose small patch, 2.0 current,    Time 19      RUE Fluidotherapy   Number Minutes Fluidotherapy 11 Minutes    RUE Fluidotherapy Location Hand;Wrist    Comments AROM of  digits- to decrease edema and stiffness              Cont to show decrease composite fist in 3rd and 4th - stiffness only in those 2 - but increase stiffness over [facility] 's this date - but increase grip strength this date    Tightness still in composite extention with wrist including  Pt hard time with PROM for wrist with palm on table -wrist extention - 10 reps  Change and review with pt table slides on towel for composite digits and wrist extention - done well -and tolerated well - pt to do 20 reps prior to med N glide But pain free        Contrast 2-3 x dayat home prior to HEP Scar massagedone by OT manual , mini massager and extractoron CT scar- andcontnight time cica scar pad over both scars -and isotoner glove night time and as needed during day - feels good per pt- cont to have  swelling and fibrosis in 3rd digit and edema in 4th Done graston tool nr 2 volar  forearm - brushing and sweeping -hold off on hand because of pt over doing her massage yesterday  Cont Rolling over foam roller for massage on volar digits and scar prior to Extention of digits- tapping5reps each  Tendon glides - stop when feeling pull- able to touch palm this datewith more ease 10 reps  Pain free And Opposition to all digits- 5 reps Table slides for wrist and digits extention prior to med N glide MEd N glide 5 reps  Ice massage over 3rd and 4th A1 pulley's several times during day if needed           OT Education - 11/01/20 0854    Education Details progress and ionto use    Person(s) Educated Patient    Methods Explanation;Demonstration;Tactile cues;Verbal cues;Handout    Comprehension Verbal cues required;Returned demonstration;Verbalized understanding            OT Short Term Goals - 09/30/20 1414      OT SHORT TERM GOAL #1   Title Pt to be independent in HEP to decrease edema , scar tissue and increase AROM to touch palm without increase symptoms    Baseline  decrease MC 70-75 flexion, decrease extention - edema and stiffness - tenderness over  A1pulley of 4th - scar  itssue - triggering of 4th with composite fist    Time 3    Period Weeks    Status New    Target Date 10/21/20             OT Long Term Goals - 09/30/20 1415      OT LONG TERM GOAL #1   Title Pt to show increase AROM in R dominant hand to WNL to have no increase symptoms and use hand to carry more than 5 lbs without increase symptoms    Baseline Pt do not carry or lift objects more than 2 lbs with R hand - decrease flexion , increase scar tissue , decrease extention of 3rd digit- triggering with composite fist on 4th digit    Time 4    Period Weeks    Status New    Target Date 10/28/20      OT LONG TERM GOAL #2   Title R grip and prehension strength increase to in range for her age withou increase symptoms    Baseline NT - pain and edema , stiffness - decrease AROM - 7 wks s/p - Grip to increase more than 33 lbs , lat and 3 point more than 8-9 lbs    Time 6    Period Weeks    Status New    Target Date 11/11/20      OT LONG TERM GOAL #3   Title Pt return to use hand in all ADL's and IADL's prior to onset of CT symptoms    Baseline Not using  hand - report mod to severe difficulty using R dominant hand still -    Time 6    Period Weeks    Status New    Target Date 11/11/20                 Plan - 11/01/20 1223    Clinical Impression Statement Pt is 11 1/2 wks s/p R endoscopy R CTR and 3rd digit trigger finger- pt cont to show increase inflammation and stiffness in 3rd digit and 4th - tender over A1pulley of 4th - done some ionto -and pt to try and use over the weekend Voltaren ointment - and not over do any scar massage    OT Occupational Profile and History Problem Focused Assessment - Including review of records relating to presenting problem    Occupational performance deficits (Please refer to evaluation for details): ADL's;IADL's;Leisure;Play    Body  Structure / Function / Physical Skills Flexibility;UE functional use;Pain;Edema;IADL;ADL    Rehab Potential Fair    Clinical Decision Making Limited treatment options, no task modification necessary    Comorbidities Affecting Occupational Performance: May have comorbidities impacting occupational performance    Modification or Assistance to Complete Evaluation  No modification of tasks or assist necessary to complete eval    OT Frequency 2x / week    OT Duration 2 weeks    OT Treatment/Interventions Self-care/ADL training;Contrast Bath;Manual Therapy;Patient/family education;Therapeutic exercise;Cryotherapy;Iontophoresis;Paraffin;Fluidtherapy;Ultrasound;Passive range of motion;Scar mobilization;Splinting    Plan assess progress with HEP and change HEP as needed    OT Home Exercise Plan see pt instruction    Consulted and Agree with Plan of Care Patient           Patient will benefit from skilled therapeutic intervention in order to improve the following deficits and impairments:   Body Structure / Function / Physical Skills: Flexibility,UE functional use,Pain,Edema,IADL,ADL       Visit Diagnosis: Stiffness of right hand, not elsewhere  classified  Scar tissue  Stiffness of right wrist, not elsewhere classified  Localized edema  Pain in joint of right hand    Problem List Patient Active Problem List   Diagnosis Date Noted  . Hypercalcemia 03/20/2018  . Osteopenia of neck of left femur 12/20/2017  . Breast cancer, right ([place]) 11/18/2017    Rosalyn Gess OTR/L,CLT 11/01/2020, 12:27 PM  [facility] [address] [place], [address] Phone: [phone]   Fax:  [phone]  Name: Brianna Rogers MRN: [id number] Date of Birth: 03-16-1946

## 2020-11-04 ENCOUNTER — Inpatient Hospital Stay: Payer: Medicare Other

## 2020-11-04 ENCOUNTER — Inpatient Hospital Stay: Payer: Medicare Other | Attending: Hematology and Oncology

## 2020-11-04 ENCOUNTER — Encounter: Payer: Self-pay | Admitting: Hematology and Oncology

## 2020-11-04 ENCOUNTER — Other Ambulatory Visit: Payer: Self-pay

## 2020-11-04 ENCOUNTER — Inpatient Hospital Stay: Payer: Medicare Other | Admitting: Hematology and Oncology

## 2020-11-04 VITALS — BP 178/71 | HR 75 | Temp 96.5°F | Resp 18 | Wt 159.6 lb

## 2020-11-04 DIAGNOSIS — M8580 Other specified disorders of bone density and structure, unspecified site: Secondary | ICD-10-CM | POA: Diagnosis present

## 2020-11-04 DIAGNOSIS — C50911 Malignant neoplasm of unspecified site of right female breast: Secondary | ICD-10-CM | POA: Diagnosis present

## 2020-11-04 DIAGNOSIS — M85852 Other specified disorders of bone density and structure, left thigh: Secondary | ICD-10-CM

## 2020-11-04 DIAGNOSIS — Z170 Estrogen receptor positive status [ER+]: Secondary | ICD-10-CM | POA: Diagnosis not present

## 2020-11-04 DIAGNOSIS — C50411 Malignant neoplasm of upper-outer quadrant of right female breast: Secondary | ICD-10-CM

## 2020-11-04 LAB — CBC WITH DIFFERENTIAL/PLATELET
Abs Immature Granulocytes: 0.05 10*3/uL (ref 0.00–0.07)
Basophils Absolute: 0 10*3/uL (ref 0.0–0.1)
Basophils Relative: 1 %
Eosinophils Absolute: 0.3 10*3/uL (ref 0.0–0.5)
Eosinophils Relative: 4 %
HCT: 39.6 % (ref 36.0–46.0)
Hemoglobin: 13.5 g/dL (ref 12.0–15.0)
Immature Granulocytes: 1 %
Lymphocytes Relative: 20 %
Lymphs Abs: 1.5 10*3/uL (ref 0.7–4.0)
MCH: 29.9 pg (ref 26.0–34.0)
MCHC: 34.1 g/dL (ref 30.0–36.0)
MCV: 87.6 fL (ref 80.0–100.0)
Monocytes Absolute: 0.6 10*3/uL (ref 0.1–1.0)
Monocytes Relative: 8 %
Neutro Abs: 5 10*3/uL (ref 1.7–7.7)
Neutrophils Relative %: 66 %
Platelets: 220 10*3/uL (ref 150–400)
RBC: 4.52 MIL/uL (ref 3.87–5.11)
RDW: 12.7 % (ref 11.5–15.5)
WBC: 7.5 10*3/uL (ref 4.0–10.5)
nRBC: 0 % (ref 0.0–0.2)

## 2020-11-04 LAB — COMPREHENSIVE METABOLIC PANEL
ALT: 21 U/L (ref 0–44)
AST: 18 U/L (ref 15–41)
Albumin: 3.8 g/dL (ref 3.5–5.0)
Alkaline Phosphatase: 49 U/L (ref 38–126)
Anion gap: 9 (ref 5–15)
BUN: 28 mg/dL — ABNORMAL HIGH (ref 8–23)
CO2: 28 mmol/L (ref 22–32)
Calcium: 10.1 mg/dL (ref 8.9–10.3)
Chloride: 101 mmol/L (ref 98–111)
Creatinine, Ser: 0.94 mg/dL (ref 0.44–1.00)
GFR, Estimated: >60 mL/min (ref >60)
Glucose, Bld: 145 mg/dL — ABNORMAL HIGH (ref 70–99)
Potassium: 4.2 mmol/L (ref 3.5–5.1)
Sodium: 138 mmol/L (ref 135–145)
Total Bilirubin: 0.8 mg/dL (ref 0.3–1.2)
Total Protein: 7.5 g/dL (ref 6.5–8.1)

## 2020-11-04 MED ORDER — DENOSUMAB 60 MG/ML ~~LOC~~ SOSY
60.0000 mg | PREFILLED_SYRINGE | Freq: Once | SUBCUTANEOUS | Status: AC
  Administered 2020-11-04: 60 mg via SUBCUTANEOUS

## 2020-11-04 NOTE — Progress Notes (Signed)
Patient here for oncology follow-up appointment, expresses concerns of carpel tunnel wrist pain

## 2020-11-05 ENCOUNTER — Other Ambulatory Visit: Payer: Self-pay

## 2020-11-05 ENCOUNTER — Ambulatory Visit: Payer: Medicare Other | Admitting: Occupational Therapy

## 2020-11-05 DIAGNOSIS — L905 Scar conditions and fibrosis of skin: Secondary | ICD-10-CM

## 2020-11-05 DIAGNOSIS — M25641 Stiffness of right hand, not elsewhere classified: Secondary | ICD-10-CM | POA: Diagnosis not present

## 2020-11-05 DIAGNOSIS — M25541 Pain in joints of right hand: Secondary | ICD-10-CM

## 2020-11-05 DIAGNOSIS — M25631 Stiffness of right wrist, not elsewhere classified: Secondary | ICD-10-CM

## 2020-11-05 DIAGNOSIS — R600 Localized edema: Secondary | ICD-10-CM

## 2020-11-05 LAB — CANCER ANTIGEN 27.29: CA 27.29: 22.9 U/mL (ref 0.0–38.6)

## 2020-11-05 NOTE — Therapy (Signed)
[facility] [address], [place], [address] Phone: [phone]   Fax:  [phone]  Occupational Therapy Treatment/progress note 09/30/20 to 11/05/20  Patient Details  Name: Brianna Rogers MRN: [id number] Date of Birth: 1945/10/16 Referring Provider (OT): Dr Roland Rack   Encounter Date: 11/05/2020   OT End of Session - 11/05/20 0817    Visit Number 10    Number of Visits 12    Date for OT Re-Evaluation 11/11/20    OT Start Time 0817    OT Stop Time 0910    OT Time Calculation (min) 53 min    Activity Tolerance Patient tolerated treatment well    Behavior During Therapy [facility] for tasks assessed/performed           Past Medical History:  Diagnosis Date  . Breast cancer ([place])   . Breast cancer, right ([place]) 11/18/2017  . Diabetes mellitus without complication ([place])   . Heart murmur   . Hypertension   . Lichen sclerosus et atrophicus   . Personal history of radiation therapy     Past Surgical History:  Procedure Laterality Date  . ABDOMINAL HYSTERECTOMY    . APPENDECTOMY    . BREAST BIOPSY Right 11/18/2017   INVASIVE MAMMARY CARCINOMA.   Marland Kitchen BREAST LUMPECTOMY Right 12/06/2017   invasive mammary carcinoma DCIS LCIS  with RAD  . CARPAL TUNNEL RELEASE Right 08/13/2020   Procedure: CARPAL TUNNEL RELEASE ENDOSCOPIC WITH RELEASE OF RIGHT LONG TRIGGER FINGER;  Surgeon: Corky Mull, MD;  Location: [facility] ORS;  Service: Orthopedics;  Laterality: Right;  . COLONOSCOPY    . COLONOSCOPY WITH PROPOFOL N/A 11/15/2017   Procedure: COLONOSCOPY WITH PROPOFOL;  Surgeon: Lollie Sails, MD;  [facility] ENDOSCOPY;  Service: Endoscopy;  Laterality: N/A;  . PARTIAL MASTECTOMY WITH NEEDLE LOCALIZATION Right 12/06/2017   Procedure: PARTIAL MASTECTOMY WITH NEEDLE LOCALIZATION;  Surgeon: Herbert Pun, MD;  Location: [facility] ORS;  Service: General;  Laterality: Right;  . SENTINEL NODE BIOPSY Right 12/06/2017    Procedure: SENTINEL NODE BIOPSY;  Surgeon: Herbert Pun, MD;  Location: [facility] ORS;  Service: General;  Laterality: Right;  . SEPTOPLASTY    . TUBAL LIGATION      There were no vitals filed for this visit.   Subjective Assessment - 11/05/20 0817    Subjective  This knot come up this weekend on the thumb side - not tender or pain - and then my ring finger feels better than last week but it stays stiff- I think you are right it slows my middle finger down - wrist feels good    Pertinent History 08/13/2020 had R endoscopy CTR and 3rd digit trigger finger release - done by Dr Roland Rack - refer to OT for eval and tx - cont stiffness, discomfort, stiffness , scar tissue and edema    Patient Stated Goals Want to get my R hand better so I can use it - want to be able to make fist , use it to work out and do things around the house , get numbness better    Currently in Pain? Yes    Pain Score 2     Pain Location Hand    Pain Orientation Right    Pain Descriptors / Indicators Tightness    Pain Type Acute pain;Surgical pain              [facility] OT Assessment - 11/05/20 0001      AROM   Right  Wrist Extension 55 Degrees      Strength   Right Hand Grip (lbs) 32    Right Hand Lateral Pinch 14 lbs    Right Hand 3 Point Pinch 12 lbs    Left Hand Grip (lbs) 24      Right Hand AROM   R Index  MCP 0-90 75 Degrees    R Index PIP 0-100 90 Degrees    R Long  MCP 0-90 80 Degrees    R Long PIP 0-100 90 Degrees    R Ring  MCP 0-90 80 Degrees    R Ring PIP 0-100 90 Degrees    R Little  MCP 0-90 90 Degrees    R Little PIP 0-100 100 Degrees           Pt made great progress with recovery CTS and 3rd digit trigger finger release - pt progress slowed down by 4th digit trigger finger symptoms Burning pain and tenderness - but not triggering          OT Treatments/Exercises (OP) - 11/05/20 0001      Iontophoresis   Type of Iontophoresis Dexamethasone    Location A1pulley of 4th R    Dose  small patch, 2.0 current,    Time 19      RUE Fluidotherapy   Number Minutes Fluidotherapy 8 Minutes    RUE Fluidotherapy Location Hand;Wrist    Comments AROM for digits to decrease triggering           pt AROM increase and able to touch palm - stiffness in 3rd and 4th  Some sensory changes still in 3rd  And tightness and swelling slowing down progress in 3rd  Grip and prehension strength increase to [facility]       Tightness still in composite extention with wrist including Pt hard time withPROM for wrist with palm on table -wristextention - 10 reps Cont table slides on towel for composite digits and wrist extention - done well -and tolerated well - pt to do 20 reps prior to med N glide But pain free      Scar massagedone by OT manual , mini massager and extractoron CT scar- andcontnight time cica scar pad over both scars -and isotoner glove night time and as needed during day - feels good per pt-cont to have  swelling and fibrosis in 3rd digit and edema in 4th Done graston tool nr 2 volar  forearm - brushing and sweeping -hold off on hand because of pt over doing her massage yesterday    Tendon glides - stop when feeling pull- able to touch palm this datewith more ease 10 reps  Pain free And Opposition to all digits- 5 reps Table slides for wrist and digits extentionprior to med N glide MEd N glide 5 reps Pt to see surgeon     OT Education - 11/05/20 0817    Education Details progress and ionto use    Person(s) Educated Patient    Methods Explanation;Demonstration;Tactile cues;Verbal cues;Handout    Comprehension Verbal cues required;Returned demonstration;Verbalized understanding            OT Short Term Goals - 11/05/20 0859      OT SHORT TERM GOAL #1   Title Pt to be independent in HEP to decrease edema , scar tissue and increase AROM to touch palm without increase symptoms    Baseline some burning on 4th digit    Status Achieved  OT Long Term Goals - 11/05/20 0859      OT LONG TERM GOAL #1   Title Pt to show increase AROM in R dominant hand to WNL to have no increase symptoms and use hand to carry more than 5 lbs without increase symptoms    Baseline no issues with carrying - but burning pain in 4th at times -swelling 3rd digit    Status Achieved      OT LONG TERM GOAL #2   Title R grip and prehension strength increase to in range for her age withou increase symptoms    Baseline Grip 32, lat gri and 3 point 14 and 12 lbs    Status Achieved      OT LONG TERM GOAL #3   Title Pt return to use hand in all ADL's and IADL's prior to onset of CT symptoms    Baseline Not using  hand - report min dificulty now because of 4th digit burning pain and stiffness in 3rd and 4th    Status Partially Met                 Plan - 11/05/20 0833    Clinical Impression Statement Pt is 12 wks s/p R endoscopy R CTR and 3rd digit trigger finger release- pt progress slowed down by 4th digit pain and inflammation - do not lock but tender over A1pulley - AROM WFL - but 3rd digit still decrease sensation and swollen/tight - grip and prhension strength increase - wrist extention still decrease - but able to tolerate composite extention stretch - pt to discuss with Dr Roland Rack 4th digit and L hand    OT Occupational Profile and History Problem Focused Assessment - Including review of records relating to presenting problem    Occupational performance deficits (Please refer to evaluation for details): ADL's;IADL's;Leisure;Play    Body Structure / Function / Physical Skills Flexibility;UE functional use;Pain;Edema;IADL;ADL    Rehab Potential Fair    Clinical Decision Making Limited treatment options, no task modification necessary    Comorbidities Affecting Occupational Performance: May have comorbidities impacting occupational performance    Modification or Assistance to Complete Evaluation  No modification of tasks or assist  necessary to complete eval    OT Frequency 2x / week    OT Duration 2 weeks    OT Treatment/Interventions Self-care/ADL training;Contrast Bath;Manual Therapy;Patient/family education;Therapeutic exercise;Cryotherapy;Iontophoresis;Paraffin;Fluidtherapy;Ultrasound;Passive range of motion;Scar mobilization;Splinting    Plan Pt to return to surgeon    OT Home Exercise Plan see pt instruction    Consulted and Agree with Plan of Care Patient           Patient will benefit from skilled therapeutic intervention in order to improve the following deficits and impairments:   Body Structure / Function / Physical Skills: Flexibility,UE functional use,Pain,Edema,IADL,ADL       Visit Diagnosis: Stiffness of right hand, not elsewhere classified  Scar tissue  Stiffness of right wrist, not elsewhere classified  Localized edema  Pain in joint of right hand    Problem List Patient Active Problem List   Diagnosis Date Noted  . Hypercalcemia 03/20/2018  . Osteopenia of neck of left femur 12/20/2017  . Breast cancer, right ([place]) 11/18/2017    Rosalyn Gess OTR/L,CLT 11/05/2020, 9:02 AM  [facility] [address] [place], [address] Phone: [phone]   Fax:  [phone]  Name: Khristian Seals MRN: [id number] Date of Birth: Apr 29, 1946

## 2020-11-07 ENCOUNTER — Ambulatory Visit: Payer: Medicare Other | Admitting: Occupational Therapy

## 2020-11-26 ENCOUNTER — Other Ambulatory Visit: Payer: Self-pay | Admitting: Surgery

## 2020-11-26 ENCOUNTER — Other Ambulatory Visit: Payer: Self-pay

## 2020-11-26 ENCOUNTER — Other Ambulatory Visit
Admission: RE | Admit: 2020-11-26 | Discharge: 2020-11-26 | Disposition: A | Discharge status: Home / Self Care | Payer: Medicare Other | Source: Ambulatory Visit | Attending: Surgery | Admitting: Surgery

## 2020-11-26 HISTORY — DX: Other complications of anesthesia, initial encounter: T88.59XA

## 2020-11-26 HISTORY — DX: Other specified postprocedural states: Z98.890

## 2020-11-26 HISTORY — DX: Chronic kidney disease, unspecified: N18.9

## 2020-11-26 HISTORY — DX: Nausea with vomiting, unspecified: R11.2

## 2020-11-26 NOTE — Patient Instructions (Addendum)
Your procedure is scheduled on: Tuesday December 03, 2020. Report to Day Surgery inside [facility] 2nd floor (stop by Admissions desk first before getting on Elevator). To find out your arrival time please call [phone] between 1PM - 3PM on Monday December 02, 2020.  Remember: Instructions that are not followed completely may result in serious medical risk,  up to and including death, or upon the discretion of your surgeon and anesthesiologist your  surgery may need to be rescheduled.     _X__ 1. Do not eat food after midnight the night before your procedure.                 No chewing gum or hard candies. You may drink clear liquids up to 2 hours                 before you are scheduled to arrive for your surgery- DO not drink clear                 liquids within 2 hours of the start of your surgery.                 Clear Liquids include:  water, apple juice without pulp, clear Gatorade, G2 or                  Gatorade Zero (avoid Red/Purple/Blue), Black Coffee or Tea (Do not add                 anything to coffee or tea).  ___X__2.   Complete the "Ensure Clear Pre-surgery Clear Carbohydrate Drink" provided to you, 2 hours before arrival. **If you are diabetic you will be provided with an alternative drink, Gatorade Zero or G2.  __X__3.  On the morning of surgery brush your teeth with toothpaste and water, you                may rinse your mouth with mouthwash if you wish.  Do not swallow any toothpaste of mouthwash.     _X__ 4.  No Alcohol for 24 hours before or after surgery.   _X__ 5.  Do Not Smoke or use e-cigarettes For 24 Hours Prior to Your Surgery.                 Do not use any chewable tobacco products for at least 6 hours prior to                 Surgery.  _X__  6.  Do not use any recreational drugs (marijuana, cocaine, heroin, ecstasy, MDMA or other)                For at least one week prior to your surgery.  Combination of these drugs with  anesthesia                May have life threatening results.   __X__  7.  Notify your doctor if there is any change in your medical condition      (cold, fever, infections).     Do not wear jewelry, make-up, hairpins, clips or nail polish. Do not wear lotions, powders, or perfumes. You may wear deodorant. Do not shave 48 hours prior to surgery. Men may shave face and neck. Do not bring valuables to the hospital.    [facility] is not responsible for any belongings or valuables.  Contacts, dentures or bridgework may not be worn into surgery. Leave your suitcase in the car.  After surgery it may be brought to your room. For patients admitted to the hospital, discharge time is determined by your treatment team.   Patients discharged the day of surgery will not be allowed to drive home.   Make arrangements for someone to be with you for the first 24 hours of your Same Day Discharge.   __X__ Take these medicines the morning of surgery with A SIP OF WATER:    1. amLODipine (NORVASC) 5 MG   2.   3.   4.  5.  6.  ____ Fleet Enema (as directed)   __X__ Use CHG Soap (or wipes) as directed  ____ Use Benzoyl Peroxide Gel as instructed  ____ Use inhalers on the day of surgery  __X__ Stop metFORMIN (GLUCOPHAGE) 1000 MG  2 days prior to surgery (Saturday March 12 will be last dose)    __X__ Take 1/2 of usual insulin dose the night before surgery. No insulin the morning          of surgery. LANTUS SOLOSTAR 100 UNIT/ML Solostar  __X__ Follow instructions from your cardiologist and or PCP about when stopping aspirin EC 81.  __X__ Stop Anti-inflammatories 1 week prior to your surgery such as meloxicam (MOBIC), Ibuprofen, Aleve, Advil, naproxen and or BC powders.    __X__ Stop supplements until after surgery. Omega-3 Fatty Acids (FISH OIL) 1200 MG    __X__ Do not start any herbal supplements before your procedure.    If you have any questions regarding your pre-procedure  instructions,  Please call Pre-admit Testing at [phone].

## 2020-11-29 ENCOUNTER — Other Ambulatory Visit
Admission: RE | Admit: 2020-11-29 | Discharge: 2020-11-29 | Disposition: A | Discharge status: Home / Self Care | Payer: Medicare Other | Source: Ambulatory Visit | Attending: Surgery | Admitting: Surgery

## 2020-11-29 ENCOUNTER — Other Ambulatory Visit: Payer: Self-pay

## 2020-11-29 DIAGNOSIS — Z20822 Contact with and (suspected) exposure to covid-19: Secondary | ICD-10-CM | POA: Diagnosis not present

## 2020-11-29 DIAGNOSIS — Z01812 Encounter for preprocedural laboratory examination: Secondary | ICD-10-CM | POA: Insufficient documentation

## 2020-11-29 LAB — SARS CORONAVIRUS 2 (TAT 6-24 HRS): SARS Coronavirus 2: NEGATIVE

## 2020-12-02 MED ORDER — FAMOTIDINE 20 MG PO TABS: 20.0000 mg | ORAL_TABLET | Freq: Once | ORAL | Status: AC

## 2020-12-02 MED ORDER — ORAL CARE MOUTH RINSE: 15.0000 mL | Freq: Once | OROMUCOSAL | Status: AC

## 2020-12-02 MED ORDER — SCOPOLAMINE 1 MG/3DAYS TD PT72
1.0000 | MEDICATED_PATCH | TRANSDERMAL | Status: DC
  Administered 2020-12-03: 1.5 mg via TRANSDERMAL

## 2020-12-02 MED ORDER — SODIUM CHLORIDE 0.9 % IV SOLN: INTRAVENOUS | Status: DC

## 2020-12-02 MED ORDER — CHLORHEXIDINE GLUCONATE 0.12 % MT SOLN: 15.0000 mL | Freq: Once | OROMUCOSAL | Status: AC

## 2020-12-02 MED ORDER — CEFAZOLIN SODIUM-DEXTROSE 2-4 GM/100ML-% IV SOLN
2.0000 g | INTRAVENOUS | Status: AC
  Administered 2020-12-03: 2 g via INTRAVENOUS

## 2020-12-03 ENCOUNTER — Ambulatory Visit
Admission: RE | Admit: 2020-12-03 | Discharge: 2020-12-03 | Disposition: A | Discharge status: Home / Self Care | Payer: Medicare Other | Attending: Surgery | Admitting: Surgery

## 2020-12-03 ENCOUNTER — Encounter: Payer: Self-pay | Admitting: Surgery

## 2020-12-03 ENCOUNTER — Ambulatory Visit: Payer: Medicare Other | Admitting: Certified Registered Nurse Anesthetist

## 2020-12-03 ENCOUNTER — Encounter
Admission: RE | Disposition: A | Discharge status: Home / Self Care | Payer: Self-pay | Source: Home / Self Care | Attending: Surgery

## 2020-12-03 DIAGNOSIS — I10 Essential (primary) hypertension: Secondary | ICD-10-CM | POA: Diagnosis not present

## 2020-12-03 DIAGNOSIS — M65341 Trigger finger, right ring finger: Secondary | ICD-10-CM | POA: Diagnosis not present

## 2020-12-03 DIAGNOSIS — Z8249 Family history of ischemic heart disease and other diseases of the circulatory system: Secondary | ICD-10-CM | POA: Insufficient documentation

## 2020-12-03 DIAGNOSIS — Z79899 Other long term (current) drug therapy: Secondary | ICD-10-CM | POA: Diagnosis not present

## 2020-12-03 DIAGNOSIS — Z791 Long term (current) use of non-steroidal anti-inflammatories (NSAID): Secondary | ICD-10-CM | POA: Diagnosis not present

## 2020-12-03 DIAGNOSIS — M65331 Trigger finger, right middle finger: Secondary | ICD-10-CM | POA: Diagnosis present

## 2020-12-03 DIAGNOSIS — Z794 Long term (current) use of insulin: Secondary | ICD-10-CM | POA: Diagnosis not present

## 2020-12-03 DIAGNOSIS — Z7982 Long term (current) use of aspirin: Secondary | ICD-10-CM | POA: Diagnosis not present

## 2020-12-03 DIAGNOSIS — E119 Type 2 diabetes mellitus without complications: Secondary | ICD-10-CM | POA: Diagnosis not present

## 2020-12-03 DIAGNOSIS — Z888 Allergy status to other drugs, medicaments and biological substances status: Secondary | ICD-10-CM | POA: Diagnosis not present

## 2020-12-03 DIAGNOSIS — Z882 Allergy status to sulfonamides status: Secondary | ICD-10-CM | POA: Diagnosis not present

## 2020-12-03 DIAGNOSIS — Z833 Family history of diabetes mellitus: Secondary | ICD-10-CM | POA: Insufficient documentation

## 2020-12-03 DIAGNOSIS — G5601 Carpal tunnel syndrome, right upper limb: Secondary | ICD-10-CM | POA: Insufficient documentation

## 2020-12-03 HISTORY — PX: TRIGGER FINGER RELEASE: SHX641

## 2020-12-03 LAB — GLUCOSE, CAPILLARY
Glucose-Capillary: 262 mg/dL — ABNORMAL HIGH (ref 70–99)
Glucose-Capillary: 227 mg/dL — ABNORMAL HIGH (ref 70–99)

## 2020-12-03 SURGERY — RELEASE, A1 PULLEY, FOR TRIGGER FINGER
Anesthesia: General | Site: Ring Finger | Laterality: Right

## 2020-12-03 MED ORDER — ONDANSETRON HCL 4 MG/2ML IJ SOLN: 4.0000 mg | Freq: Four times a day (QID) | INTRAMUSCULAR | Status: DC | PRN

## 2020-12-03 MED ORDER — BUPIVACAINE HCL (PF) 0.5 % IJ SOLN
INTRAMUSCULAR | Status: DC | PRN
  Administered 2020-12-03: 10 mL

## 2020-12-03 MED ORDER — INSULIN ASPART 100 UNIT/ML ~~LOC~~ SOLN: 7.0000 [IU] | Freq: Once | SUBCUTANEOUS | Status: AC

## 2020-12-03 MED ORDER — SCOPOLAMINE 1 MG/3DAYS TD PT72
MEDICATED_PATCH | TRANSDERMAL | Status: AC
  Filled 2020-12-03: qty 1

## 2020-12-03 MED ORDER — FENTANYL CITRATE (PF) 100 MCG/2ML IJ SOLN
INTRAMUSCULAR | Status: AC
  Filled 2020-12-03: qty 2

## 2020-12-03 MED ORDER — POTASSIUM CHLORIDE IN NACL 20-0.9 MEQ/L-% IV SOLN
INTRAVENOUS | Status: DC
  Filled 2020-12-03 (×3): qty 1000

## 2020-12-03 MED ORDER — FENTANYL CITRATE (PF) 100 MCG/2ML IJ SOLN: 25.0000 ug | INTRAMUSCULAR | Status: DC | PRN

## 2020-12-03 MED ORDER — CEFAZOLIN SODIUM-DEXTROSE 2-4 GM/100ML-% IV SOLN
INTRAVENOUS | Status: AC
  Filled 2020-12-03: qty 100

## 2020-12-03 MED ORDER — INSULIN ASPART 100 UNIT/ML ~~LOC~~ SOLN
SUBCUTANEOUS | Status: AC
  Administered 2020-12-03: 7 [IU] via SUBCUTANEOUS
  Filled 2020-12-03: qty 1

## 2020-12-03 MED ORDER — FENTANYL CITRATE (PF) 100 MCG/2ML IJ SOLN
INTRAMUSCULAR | Status: DC | PRN
  Administered 2020-12-03 (×2): 25 ug via INTRAVENOUS
  Administered 2020-12-03: 50 ug via INTRAVENOUS

## 2020-12-03 MED ORDER — BUPIVACAINE HCL (PF) 0.5 % IJ SOLN
INTRAMUSCULAR | Status: AC
  Filled 2020-12-03: qty 30

## 2020-12-03 MED ORDER — METOCLOPRAMIDE HCL 5 MG/ML IJ SOLN: 5.0000 mg | Freq: Three times a day (TID) | INTRAMUSCULAR | Status: DC | PRN

## 2020-12-03 MED ORDER — ONDANSETRON HCL 4 MG/2ML IJ SOLN: 4.0000 mg | Freq: Once | INTRAMUSCULAR | Status: DC | PRN

## 2020-12-03 MED ORDER — DIPHENHYDRAMINE HCL 50 MG/ML IJ SOLN
INTRAMUSCULAR | Status: DC | PRN
  Administered 2020-12-03: 6.25 mg via INTRAVENOUS

## 2020-12-03 MED ORDER — TRAMADOL HCL 50 MG PO TABS: 50.0000 mg | ORAL_TABLET | Freq: Four times a day (QID) | ORAL | Status: DC | PRN

## 2020-12-03 MED ORDER — FAMOTIDINE 20 MG PO TABS
ORAL_TABLET | ORAL | Status: AC
  Administered 2020-12-03: 20 mg via ORAL
  Filled 2020-12-03: qty 1

## 2020-12-03 MED ORDER — CHLORHEXIDINE GLUCONATE 0.12 % MT SOLN
OROMUCOSAL | Status: AC
  Administered 2020-12-03: 15 mL via OROMUCOSAL
  Filled 2020-12-03: qty 15

## 2020-12-03 MED ORDER — LIDOCAINE HCL (CARDIAC) PF 100 MG/5ML IV SOSY
PREFILLED_SYRINGE | INTRAVENOUS | Status: DC | PRN
  Administered 2020-12-03: 80 mg via INTRAVENOUS

## 2020-12-03 MED ORDER — PHENYLEPHRINE HCL (PRESSORS) 10 MG/ML IV SOLN
INTRAVENOUS | Status: DC | PRN
  Administered 2020-12-03 (×3): 100 ug via INTRAVENOUS

## 2020-12-03 MED ORDER — PROPOFOL 10 MG/ML IV BOLUS
INTRAVENOUS | Status: DC | PRN
  Administered 2020-12-03: 160 mg via INTRAVENOUS

## 2020-12-03 MED ORDER — ONDANSETRON HCL 4 MG PO TABS: 4.0000 mg | ORAL_TABLET | Freq: Four times a day (QID) | ORAL | Status: DC | PRN

## 2020-12-03 MED ORDER — ONDANSETRON HCL 4 MG/2ML IJ SOLN
INTRAMUSCULAR | Status: DC | PRN
  Administered 2020-12-03: 4 mg via INTRAVENOUS

## 2020-12-03 MED ORDER — METOCLOPRAMIDE HCL 10 MG PO TABS
5.0000 mg | ORAL_TABLET | Freq: Three times a day (TID) | ORAL | Status: DC | PRN
Start: 2020-12-03 — End: 2020-12-03

## 2020-12-03 SURGICAL SUPPLY — 25 items
APL PRP STRL LF DISP 70% ISPRP (MISCELLANEOUS) ×1
BNDG CMPR STD VLCR NS LF 5.8X2 (GAUZE/BANDAGES/DRESSINGS) ×1
BNDG ELASTIC 2X5.8 VLCR NS LF (GAUZE/BANDAGES/DRESSINGS) ×2 IMPLANT
BNDG ESMARK 4X12 TAN STRL LF (GAUZE/BANDAGES/DRESSINGS) ×2 IMPLANT
CHLORAPREP W/TINT 26 (MISCELLANEOUS) ×2 IMPLANT
CORD BIP STRL DISP 12FT (MISCELLANEOUS) ×2 IMPLANT
COVER WAND RF STERILE (DRAPES) IMPLANT
CUFF TOURN SGL QUICK 18X4 (TOURNIQUET CUFF) ×2 IMPLANT
DRAPE SURG 17X11 SM STRL (DRAPES) ×4 IMPLANT
FORCEPS JEWEL BIP 4-3/4 STR (INSTRUMENTS) ×2 IMPLANT
GAUZE SPONGE 4X4 12PLY STRL (GAUZE/BANDAGES/DRESSINGS) ×2 IMPLANT
GAUZE XEROFORM 1X8 LF (GAUZE/BANDAGES/DRESSINGS) ×2 IMPLANT
GLOVE INDICATOR 8.0 STRL GRN (GLOVE) ×2 IMPLANT
GLOVE SURG ENC MOIS LTX SZ8 (GLOVE) IMPLANT
GOWN STRL REUS W/ TWL LRG LVL3 (GOWN DISPOSABLE) ×1 IMPLANT
GOWN STRL REUS W/ TWL XL LVL3 (GOWN DISPOSABLE) ×1 IMPLANT
GOWN STRL REUS W/TWL LRG LVL3 (GOWN DISPOSABLE) ×2
GOWN STRL REUS W/TWL XL LVL3 (GOWN DISPOSABLE) ×2
KIT TURNOVER KIT A (KITS) ×2 IMPLANT
MANIFOLD NEPTUNE II (INSTRUMENTS) ×2 IMPLANT
NEEDLE HYPO 25X1 1.5 SAFETY (NEEDLE) ×2 IMPLANT
NS IRRIG 500ML POUR BTL (IV SOLUTION) ×2 IMPLANT
PACK EXTREMITY ARMC (MISCELLANEOUS) ×2 IMPLANT
STOCKINETTE IMPERVIOUS 9X36 MD (GAUZE/BANDAGES/DRESSINGS) ×2 IMPLANT
SUT PROLENE 4 0 PS 2 18 (SUTURE) ×2 IMPLANT

## 2020-12-03 NOTE — Discharge Instructions (Addendum)
Orthopedic discharge instructions: Keep dressing dry and intact. Keep hand elevated above heart level. May shower after dressing removed on postop day 4 (Saturday). Cover sutures with Band-Aids after drying off. Apply ice to affected area frequently. Take Mobic 15 mg daily OR ibuprofen 600 mg TID with meals for 5-7 days, then as necessary. Take ES Tylenol or pain medication as prescribed when needed.  Return for follow-up in 10-14 days or as scheduled.   AMBULATORY SURGERY  DISCHARGE INSTRUCTIONS   1) The drugs that you were given will stay in your system until tomorrow so for the next 24 hours you should not:  A) Drive an automobile B) Make any legal decisions C) Drink any alcoholic beverage   2) You may resume regular meals tomorrow.  Today it is better to start with liquids and gradually work up to solid foods.  You may eat anything you prefer, but it is better to start with liquids, then soup and crackers, and gradually work up to solid foods.   3) Please notify your doctor immediately if you have any unusual bleeding, trouble breathing, redness and pain at the surgery site, drainage, fever, or pain not relieved by medication.    4) Additional Instructions:        Please contact your physician with any problems or Same Day Surgery at [phone], Monday through Friday 6 am to 4 pm, or [place] at [facility] number at [phone].

## 2020-12-03 NOTE — Op Note (Signed)
12/03/2020  12:02 PM  Patient:   Brianna Rogers  Pre-Op Diagnosis:   Right ring trigger finger.  Post-Op Diagnosis:   Same  Procedure:   Release right ring trigger finger.  Surgeon:   Pascal Lux, MD  Assistant:   Jaynie Bream, PA-S  Anesthesia:   General LMA  Findings:   As above.  Complications:   None  EBL:   0 cc  Fluids:   500 cc crystalloid  TT:   14 minutes at 250 mmHg  Drains:   None  Closure:   4-0 Prolene  Implants:   None  Brief Clinical Note:   The patient is a 75 year old female with a history of progressively worsening painful catching of her right ring finger. These symptoms have progressed despite medications, activity modification, etc. The patient's history and examination were consistent with a right ring trigger finger. The patient presents at this time for a right ring trigger finger release.  Procedure:   The patient was brought into the operating room and lain in the supine position. After adequate general laryngeal mask anesthesia was achieved, the right hand and upper extremity were prepped with ChloraPrep solution before being draped sterilely. Preoperative antibiotics were administered.  A timeout was performed to verify the appropriate surgical site before the limb was exsanguinated with an Esmarch and the tourniquet inflated to 250 mmHg.    An approximately 1.5-2.0 cm incision was made over the volar aspect of the right ring finger at the level of the metacarpal head centered over the flexor sheath. The incision was carried down through the subcutaneous tissues with care taken to identify and protect the digital neurovascular structures. The flexor sheath was entered just proximal to the A1 pulley. The sheath was released proximally for several centimeters under direct visualization. Distally, a clamp was placed beneath the A1 pulley and used to release any adhesions. The clamp was repositioned so that one jaw was superficial to and the other  jaw deep to the A1 pulley. The A1 pulley was incised on either side of the clamp to remove a 2 mm strip of tissue. Metzenbaum scissors were used to ensure complete release of the A1 pulley more distally. The underlying tendons were carefully inspected and found to be intact.   The wound was copiously irrigated with sterile saline solution before the wound was closed using 4-0 Prolene interrupted sutures. A total of 10 cc of 0.5% plain Sensorcaine was injected in and around the incision to help with postoperative analgesia before a sterile bulky dressing was applied to the hand. The patient was then awakened and returned to the recovery room in satisfactory condition after tolerating the procedure well.

## 2020-12-03 NOTE — Anesthesia Preprocedure Evaluation (Signed)
Anesthesia Evaluation  Patient identified by MRN, date of birth, ID band Patient awake    Reviewed: Allergy & Precautions, NPO status , Patient's Chart, lab work & pertinent test results  History of Anesthesia Complications (+) PONV and history of anesthetic complications  Airway Mallampati: III       Dental   Pulmonary neg sleep apnea, neg COPD, Not current smoker,           Cardiovascular hypertension, Pt. on medications (-) Past MI and (-) CHF (-) dysrhythmias + Valvular Problems/Murmurs (murmur, no tx)      Neuro/Psych neg Seizures    GI/Hepatic Neg liver ROS, neg GERD  ,  Endo/Other  diabetes, Type 2, Oral Hypoglycemic Agents, Insulin Dependent  Renal/GU Renal InsufficiencyRenal disease     Musculoskeletal   Abdominal   Peds  Hematology   Anesthesia Other Findings   Reproductive/Obstetrics                             Anesthesia Physical Anesthesia Plan  ASA: II  Anesthesia Plan: General   Post-op Pain Management:    Induction: Intravenous  PONV Risk Score and Plan: 4 or greater and Ondansetron, Dexamethasone, Propofol infusion and TIVA  Airway Management Planned: LMA  Additional Equipment:   Intra-op Plan:   Post-operative Plan:   Informed Consent: I have reviewed the patients History and Physical, chart, labs and discussed the procedure including the risks, benefits and alternatives for the proposed anesthesia with the patient or authorized representative who has indicated his/her understanding and acceptance.       Plan Discussed with:   Anesthesia Plan Comments:         Anesthesia Quick Evaluation

## 2020-12-03 NOTE — Transfer of Care (Signed)
Immediate Anesthesia Transfer of Care Note  Patient: Brianna Rogers  Procedure(s) Performed: RELEASE TRIGGER FINGER/A-1 PULLEY (Right Ring Finger)  Patient Location: PACU  Anesthesia Type:General  Level of Consciousness: awake, alert  and oriented  Airway & Oxygen Therapy: Patient Spontanous Breathing and Patient connected to face mask oxygen  Post-op Assessment: Report given to RN and Post -op Vital signs reviewed and stable  Post vital signs: Reviewed and stable  Last Vitals:  Vitals Value Taken Time  BP 114/58 12/03/20 1201  Temp 36 C 12/03/20 1200  Pulse 62 12/03/20 1204  Resp 11 12/03/20 1204  SpO2 100 % 12/03/20 1204  Vitals shown include unvalidated device data.  Last Pain:  Vitals:   12/03/20 1105  TempSrc: Temporal  PainSc:          Complications: No complications documented.

## 2020-12-03 NOTE — H&P (Signed)
History of Present Illness:  Brianna Rogers is a 75 y.o. female who presents for follow-up now 3 months status post an endoscopic right carpal tunnel release with release of a right long trigger finger. Overall, the patient feels that she is making excellent progress. She has been attending occupational therapy as well as doing exercises on her own at home, feels that she has made great progress with her range of motion, strength, and overall function. She denies any reinjury to the hand, and denies any fevers or chills. She also denies any numbness or paresthesias to her fingers.  Her primary concern today is increased pain and stiffness of the right ring finger. She wonders if this may have been present all along, but because of the pain and stiffness she was experiencing in her hand from the carpal tunnel and right long trigger finger that she just did not recognize similar symptoms are present in the ring finger. She wonders if these right trigger finger symptoms might be limiting some of her ability to regain all of her right middle finger motion. She is quite frustrated by the symptoms and would like to discuss alternative treatment options.  Current Outpatient Medications: . amLODIPine (NORVASC) 5 MG tablet Take 1 tablet (5 mg total) by mouth once daily 90 tablet 1  . aspirin 81 MG EC tablet Take 81 mg by mouth once daily.  . flash glucose scanning reader (FREESTYLE LIBRE 14 DAY READER) Misc Use 1 Device as directed 1 each 0  . FREESTYLE LIBRE 14 DAY SENSOR kit USE AS DIRECTED EVERY 14 DAYS 12 kit 1  . imiquimod (ALDARA) 5 % cream Apply topically as needed  . insulin GLARGINE (LANTUS SOLOSTAR U-100 INSULIN) pen injector (concentration 100 units/mL) Inject 40 Units subcutaneously once daily 30 mL 1  . insulin LISPRO (HUMALOG KWIKPEN INSULIN) pen injector (concentration 100 units/mL) ADMINISTER 14 UNITS UNDER THE SKIN WITH BREAKFAST AND SUPPER AS DIRECTED 20 mL 1  . letrozole (FEMARA) 2.5 mg  tablet Take 2.5 mg by mouth once daily  . meloxicam (MOBIC) 15 MG tablet TAKE 1 TABLET(15 MG) BY MOUTH EVERY DAY 90 tablet 1  . metFORMIN (GLUCOPHAGE) 1000 MG tablet TAKE 1 TABLET(1000 MG) BY MOUTH TWICE DAILY WITH MEALS 180 tablet 1  . multivitamin tablet Take 1 tablet by mouth once daily.  Marland Kitchen nystatin (MYCOSTATIN) 100,000 unit/gram powder Apply topically 3 (three) times daily as needed  . omega-3 fatty acids-fish oil 360-1,200 mg Cap Take 2,400 mg by mouth 2 (two) times daily  . pen needle, diabetic (BD ULTRA-FINE SHORT PEN NEEDLE) 31 gauge x 5/16" needle Apply to insulin pen once daily for administering. 100 each 12  . PREVIDENT 5000 DRY MOUTH 1.1 % gel Place onto teeth nightly  . quinapriL (ACCUPRIL) 20 MG tablet Take 1 tablet (20 mg total) by mouth 2 (two) times daily 180 tablet 1  . rosuvastatin (CRESTOR) 5 MG tablet Take 1 tablet (5 mg total) by mouth every other day 45 tablet 1   Allergies:  . Fosamax [Alendronate] Itching  . Jardiance [Empagliflozin] Itching  . Nitrofurantoin Macrocrystal Other (Flu like symptoms)  . Sulfa (Sulfonamide Antibiotics) Other (Flu like symptoms)   Past Medical History:  . Allergy 2000  . Cardiac murmur 3903 (2/6 systolic best heard at 2nd Lt ICS)  . Carpal tunnel syndrome, bilateral 04/05/2020  bilateral moderate (grade III) carpal tunnel syndrome on EMG  . Cherry angioma  . Deviated septum (s/p "scraping" procedure yrs ago, but sx returned)  .  Diabetes mellitus, type 2 (CMS-HCC) ~2003  . Diverticula of colon  . Hyperlipidemia, mixed ~2003  . Hypertension ~2003  . Lichen sclerosus 8242 (abdomen. Tx'd by derm)  . Malignant neoplasm of upper-outer quadrant of right breast in female, estrogen receptor positive (CMS-HCC) 12/21/2017 (s/p lumpectomy & XRT. On letrizole therapy)  . Microalbuminuric diabetic nephropathy (CMS-HCC)  . Osteopenia (Based on Dexa from 12/09/15 & again in 2019 (fem neck only))  . Seborrheic keratoses  . Shingles 2008  . Vitamin D  deficiency 2014 (Tx'd & resolved)  . Wart (Tx'd w/ cryo x1 by Dermatology)   Past Surgical History:  . 1. Endoscopic right carpal tunnel release. 2. Release right long trigger finger. Right 08/13/2020 (Dr. Roland Rack)  . APPENDECTOMY ~1991 (w/ TAH)  . COLONOSCOPY 11/29/2009 - @ [facility] - Diverticulosis, FHPolyps(m), 5 yr rpt per provider  . COLONOSCOPY 11/15/2017 - Tubular adenoma of the colon/Repeat 54yr/MUS  . HYSTERECTOMY ~1991 (TAH w/ BSO 2/2 benign tumor on Lt ovary)  . MASTECTOMY, PARTIAL Right 12/06/2017 (Dr. CPeyton Najjar- w/ sentinel node)  . PERCUTANEOUS BIOPSY BREAST W/NEEDLE LOCALIZATION Right 11/18/2017 w/ marker chip placement  . TUBAL LIGATION ~1986   Family History:  . Diabetes type II Mother  . Hypothyroidism Mother  . High blood pressure (Hypertension) Mother  . Heart disease Mother  . Breast cancer Mother (s/p Lt mastectomy)  . Melanoma Mother  . Hyperlipidemia (Elevated cholesterol) Mother  . Colon polyps Mother  . Stroke Mother  . Diabetes Mother  . Thyroid disease Mother  . Heart disease Father  . High blood pressure (Hypertension) Father  . Hyperlipidemia (Elevated cholesterol) Father  . Stroke Father  . Sudden cardiac death Father ((16   Social History:   Socioeconomic History:  .Marland KitchenMarital status: Single  Spouse name: Not on file  . Number of children: 0  . Years of education: Not on file  . Highest education level: Not on file  Occupational History  . Not on file  Tobacco Use  . Smoking status: Never Smoker  . Smokeless tobacco: Never Used  Vaping Use  . Vaping Use: Never used  Substance and Sexual Activity  . Alcohol use: Never  . Drug use: Never  . Sexual activity: Not Currently  Partners: Male  Birth control/protection: None  Other Topics Concern  . Not on file  Social History Narrative  [place], [place] mother's health requires increased care giving & night time assistance. [place]idence [place] Plans to sell her house & move into mother's house full-time when mother passes.  [place]   Social Determinants of Health:   FEmergency planning/management officerStrain: Not on file  Food Insecurity: Not on file  Transportation Needs: Not on file   Review of Systems:  A comprehensive 14 point ROS was performed, reviewed, and the pertinent orthopaedic findings are documented in the HPI.  Physical Exam: Vitals:  11/06/20 0916  BP: (!) 142/86  Weight: 73.4 kg (161 lb 12.8 oz)  Height: 149.9 cm (_0 )  PainSc: 0-No pain  PainLoc: Hand   General/Constitutional: The patient appears to be well-nourished, well-developed, and in no acute distress. Neuro/Psych: Normal mood and affect, oriented to person, place and time.  Eyes: Non-icteric. Pupils are equal, round, and reactive to light, and exhibit synchronous movement. ENT: Unremarkable. Lymphatic: No palpable adenopathy. Respiratory: Lungs clear to auscultation, Normal chest excursion, No wheezes and Non-labored  breathing Cardiovascular: Regular rate and rhythm. No murmurs. and No edema, swelling or tenderness, except as noted in detailed exam. Integumentary: No impressive skin lesions present, except as noted in detailed exam. Musculoskeletal: Unremarkable, except as noted in detailed exam.  Right wrist/hand exam: Skin inspection of the right wrist and hand again is notable for well-healed surgical incisions over the volar aspect of the wrist as well as over the volar aspect of the right long finger in the area of the long metacarpal head which are without evidence for infection. No swelling, erythema, ecchymosis, abrasions, or other skin abnormalities are identified. There is a small raised area in the palmar aspect of her hand, consistent with some residual scar tissue, but this area is nontender. There is also mild-moderate tenderness to palpation over the palmar aspect of her right ring  finger at the level of the metacarpal head. Otherwise, there is no tenderness to palpation around her wrist or hand. Actively, she is able to extend her wrist to 65 degrees and flex her wrist to 60 degrees without any discomfort. She is able to actively flex and extend all digits much more easily and is able to nearly make a full fist. However, she is now having pain with flexion of the ring finger, as well as mild triggering of this finger with active range of motion. There is no pain or triggering with range of motion of any of the other digits. She remains neurovascularly intact to all digits.  Assessment: 1. Carpal tunnel syndrome, right.  2. Trigger ring finger of right hand.   Plan: The treatment options were discussed with the patient. In addition, patient educational materials were provided regarding the diagnosis and treatment options. Overall, the patient is delighted with her symptomatic and functional improvement as a pertains to her recovery following her carpal tunnel release and her right long trigger finger release. However, she becoming increasingly frustrated by the symptoms that she is now experiencing in the right ring finger despite extensive occupational therapy. She is offered but declines a steroid injection to the ring flexor sheath. Therefore, I have recommended a surgical procedure, specifically a release of the right ring trigger finger. The procedure was discussed with the patient, as were the potential risks (including bleeding, infection, nerve and/or blood vessel injury, persistent or recurrent pain/triggering, weakness of grip, stiffness of the finger, need for further surgery, blood clots, strokes, heart attacks and/or arhythmias, pneumonia, etc.) and benefits. The patient states his/her understanding and wishes to proceed. All of the patient's questions and concerns were answered. She can call any time with further concerns. She will follow up post-surgery, routine.   H&P  reviewed and patient re-examined. No changes.

## 2020-12-03 NOTE — Anesthesia Postprocedure Evaluation (Signed)
Anesthesia Post Note  Patient: Brianna Rogers  Procedure(s) Performed: RELEASE TRIGGER FINGER/A-1 PULLEY (Right Ring Finger)  Patient location during evaluation: PACU Anesthesia Type: General Level of consciousness: awake and alert Pain management: pain level controlled Vital Signs Assessment: post-procedure vital signs reviewed and stable Respiratory status: spontaneous breathing and respiratory function stable Cardiovascular status: stable Anesthetic complications: no   No complications documented.   Last Vitals:  Vitals:   12/03/20 1313 12/03/20 1328  BP: (!) 176/69 (!) 160/74  Pulse: 71 68  Resp: 17   Temp: 36.8 C   SpO2: 98%     Last Pain:  Vitals:   12/03/20 1313  TempSrc: Temporal  PainSc:                  Soleia Badolato K

## 2020-12-03 NOTE — Anesthesia Procedure Notes (Signed)
Procedure Name: LMA Insertion Date/Time: 12/03/2020 11:25 AM Performed by: Willette Alma, CRNA Pre-anesthesia Checklist: Patient identified, Patient being monitored, Timeout performed, Emergency Drugs available and Suction available Patient Re-evaluated:Patient Re-evaluated prior to induction Oxygen Delivery Method: Circle system utilized Preoxygenation: Pre-oxygenation with 100% oxygen Induction Type: IV induction Ventilation: Mask ventilation without difficulty LMA: LMA inserted LMA Size: 3.0 Tube type: Oral Number of attempts: 1 Placement Confirmation: positive ETCO2 and breath sounds checked- equal and bilateral Tube secured with: Tape Dental Injury: Teeth and Oropharynx as per pre-operative assessment

## 2020-12-04 ENCOUNTER — Encounter: Payer: Self-pay | Admitting: Surgery

## 2020-12-26 ENCOUNTER — Other Ambulatory Visit: Payer: Self-pay | Admitting: Hematology and Oncology

## 2021-01-31 ENCOUNTER — Other Ambulatory Visit: Payer: Self-pay | Admitting: Surgery

## 2021-02-07 ENCOUNTER — Other Ambulatory Visit: Payer: Self-pay

## 2021-02-07 ENCOUNTER — Encounter
Admission: RE | Admit: 2021-02-07 | Discharge: 2021-02-07 | Disposition: A | Discharge status: Home / Self Care | Payer: Medicare Other | Source: Ambulatory Visit | Attending: Surgery | Admitting: Surgery

## 2021-02-07 NOTE — Patient Instructions (Signed)
Your procedure is scheduled on:02-11-21  Report to the Registration Desk on the 1st floor of the [facility]. To find out your arrival time, please call [phone] between 1PM - 3PM on:02-10-21  REMEMBER: Instructions that are not followed completely may result in serious medical risk, up to and including death; or upon the discretion of your surgeon and anesthesiologist your surgery may need to be rescheduled.  Do not eat food after midnight the night before surgery.  No gum chewing, lozengers or hard candies.  You may however, drink water up to 2 hours before you are scheduled to arrive for your surgery. Do not drink anything within 2 hours of your scheduled arrival time.  Type 1 and Type 2 diabetics should only drink water.  TAKE THESE MEDICATIONS THE MORNING OF SURGERY WITH A SIP OF WATER: -AMLODIPINE (NORVASC)  Stop Metformin 2 days prior to surgery-LAST DOSE ON 02-08-21 SATURDAY  Take 1/2 of usual insulin dose the night before surgery and none on the morning of surgery  -LAST DOSE OF 81 MG ASPIRIN ON 02-06-21  One week prior to surgery: Stop Anti-inflammatories (NSAIDS) such as MOBIC (MELOXICAM) Advil, Aleve, Ibuprofen, Motrin, Naproxen, Naprosyn and Aspirin based products such as Excedrin, Goodys Powder, BC Powder-OK TO TAKE TYLENOL IF NEEDED  Stop ANY OVER THE COUNTER supplements/vitamins until after surgery-last dose of vitamins/supplements was on 02-04-21  No Alcohol for 24 hours before or after surgery.  No Smoking including e-cigarettes for 24 hours prior to surgery.  No chewable tobacco products for at least 6 hours prior to surgery.  No nicotine patches on the day of surgery.  Do not use any "recreational" drugs for at least a week prior to your surgery.  Please be advised that the combination of cocaine and anesthesia may have negative outcomes, up to and including death. If you test positive for cocaine, your surgery will be cancelled.  On the morning of surgery  brush your teeth with toothpaste and water, you may rinse your mouth with mouthwash if you wish. Do not swallow any toothpaste or mouthwash.  Do not wear jewelry, make-up, hairpins, clips or nail polish.  Do not wear lotions, powders, or perfumes.   Do not shave body from the neck down 48 hours prior to surgery just in case you cut yourself which could leave a site for infection.  Also, freshly shaved skin may become irritated if using the CHG soap.  Contact lenses, hearing aids and dentures may not be worn into surgery.  Do not bring valuables to the hospital. [facility] is not responsible for any missing/lost belongings or valuables.   Use CHG Soap as directed on instruction sheet  Notify your doctor if there is any change in your medical condition (cold, fever, infection).  Wear comfortable clothing (specific to your surgery type) to the hospital.  Plan for stool softeners for home use; pain medications have a tendency to cause constipation. You can also help prevent constipation by eating foods high in fiber such as fruits and vegetables and drinking plenty of fluids as your diet allows.  After surgery, you can help prevent lung complications by doing breathing exercises.  Take deep breaths and cough every 1-2 hours. Your doctor may order a device called an Incentive Spirometer to help you take deep breaths. When coughing or sneezing, hold a pillow firmly against your incision with both hands. This is called "splinting." Doing this helps protect your incision. It also decreases belly discomfort.  If you are being  admitted to the hospital overnight, leave your suitcase in the car. After surgery it may be brought to your room.  If you are being discharged the day of surgery, you will not be allowed to drive home. You will need a responsible adult (18 years or older) to drive you home and stay with you that night.   If you are taking public transportation, you will need to have a  responsible adult (18 years or older) with you. Please confirm with your physician that it is acceptable to use public transportation.   Please call the [facility] at [phone] if you have any questions about these instructions.  Surgery Visitation Policy:  Patients undergoing a surgery or procedure may have one family member or support person with them as long as that person is not COVID-19 positive or experiencing its symptoms.  That person may remain in the waiting area during the procedure.  Inpatient Visitation:    Visiting hours are 7 a.m. to 8 p.m. Inpatients will be allowed two visitors daily. The visitors may change each day during the patient's stay. No visitors under the age of 54. Any visitor under the age of 34 must be accompanied by an adult. The visitor must pass COVID-19 screenings, use hand sanitizer when entering and exiting the patient's room and wear a mask at all times, including in the patient's room. Patients must also wear a mask when staff or their visitor are in the room. Masking is required regardless of vaccination status.

## 2021-02-11 ENCOUNTER — Other Ambulatory Visit: Payer: Self-pay

## 2021-02-11 ENCOUNTER — Encounter
Admission: RE | Disposition: A | Discharge status: Home / Self Care | Payer: Self-pay | Source: Ambulatory Visit | Attending: Surgery

## 2021-02-11 ENCOUNTER — Ambulatory Visit
Admission: RE | Admit: 2021-02-11 | Discharge: 2021-02-11 | Disposition: A | Discharge status: Home / Self Care | Payer: Medicare Other | Source: Ambulatory Visit | Attending: Surgery | Admitting: Surgery

## 2021-02-11 ENCOUNTER — Ambulatory Visit: Payer: Medicare Other | Admitting: Anesthesiology

## 2021-02-11 DIAGNOSIS — Z79899 Other long term (current) drug therapy: Secondary | ICD-10-CM | POA: Insufficient documentation

## 2021-02-11 DIAGNOSIS — Z881 Allergy status to other antibiotic agents status: Secondary | ICD-10-CM | POA: Insufficient documentation

## 2021-02-11 DIAGNOSIS — M65322 Trigger finger, left index finger: Secondary | ICD-10-CM | POA: Insufficient documentation

## 2021-02-11 DIAGNOSIS — Z7984 Long term (current) use of oral hypoglycemic drugs: Secondary | ICD-10-CM | POA: Diagnosis not present

## 2021-02-11 DIAGNOSIS — Z79811 Long term (current) use of aromatase inhibitors: Secondary | ICD-10-CM | POA: Insufficient documentation

## 2021-02-11 DIAGNOSIS — G5602 Carpal tunnel syndrome, left upper limb: Secondary | ICD-10-CM | POA: Diagnosis not present

## 2021-02-11 DIAGNOSIS — M65342 Trigger finger, left ring finger: Secondary | ICD-10-CM | POA: Insufficient documentation

## 2021-02-11 DIAGNOSIS — Z888 Allergy status to other drugs, medicaments and biological substances status: Secondary | ICD-10-CM | POA: Diagnosis not present

## 2021-02-11 DIAGNOSIS — Z7982 Long term (current) use of aspirin: Secondary | ICD-10-CM | POA: Insufficient documentation

## 2021-02-11 DIAGNOSIS — Z794 Long term (current) use of insulin: Secondary | ICD-10-CM | POA: Diagnosis not present

## 2021-02-11 DIAGNOSIS — Z882 Allergy status to sulfonamides status: Secondary | ICD-10-CM | POA: Diagnosis not present

## 2021-02-11 HISTORY — PX: TRIGGER FINGER RELEASE: SHX641

## 2021-02-11 LAB — GLUCOSE, CAPILLARY
Glucose-Capillary: 246 mg/dL — ABNORMAL HIGH (ref 70–99)
Glucose-Capillary: 204 mg/dL — ABNORMAL HIGH (ref 70–99)

## 2021-02-11 SURGERY — RELEASE, A1 PULLEY, FOR TRIGGER FINGER
Anesthesia: General | Site: Finger | Laterality: Left

## 2021-02-11 MED ORDER — CEFAZOLIN SODIUM-DEXTROSE 2-4 GM/100ML-% IV SOLN
INTRAVENOUS | Status: AC
  Filled 2021-02-11: qty 100

## 2021-02-11 MED ORDER — PROPOFOL 10 MG/ML IV BOLUS
INTRAVENOUS | Status: DC | PRN
  Administered 2021-02-11: 40 mg via INTRAVENOUS
  Administered 2021-02-11: 160 mg via INTRAVENOUS

## 2021-02-11 MED ORDER — METOCLOPRAMIDE HCL 5 MG/ML IJ SOLN: 5.0000 mg | Freq: Three times a day (TID) | INTRAMUSCULAR | Status: DC | PRN

## 2021-02-11 MED ORDER — BUPIVACAINE HCL (PF) 0.5 % IJ SOLN
INTRAMUSCULAR | Status: AC
  Filled 2021-02-11: qty 30

## 2021-02-11 MED ORDER — SODIUM CHLORIDE 0.9 % IV SOLN: INTRAVENOUS | Status: DC

## 2021-02-11 MED ORDER — FENTANYL CITRATE (PF) 100 MCG/2ML IJ SOLN
INTRAMUSCULAR | Status: AC
  Filled 2021-02-11: qty 2

## 2021-02-11 MED ORDER — EPHEDRINE 5 MG/ML INJ
INTRAVENOUS | Status: AC
  Filled 2021-02-11: qty 10

## 2021-02-11 MED ORDER — DEXAMETHASONE SODIUM PHOSPHATE 10 MG/ML IJ SOLN
INTRAMUSCULAR | Status: AC
  Filled 2021-02-11: qty 1

## 2021-02-11 MED ORDER — ONDANSETRON HCL 4 MG/2ML IJ SOLN: 4.0000 mg | Freq: Four times a day (QID) | INTRAMUSCULAR | Status: DC | PRN

## 2021-02-11 MED ORDER — APREPITANT 40 MG PO CAPS
ORAL_CAPSULE | ORAL | Status: AC
  Filled 2021-02-11: qty 1

## 2021-02-11 MED ORDER — BUPIVACAINE HCL (PF) 0.5 % IJ SOLN
INTRAMUSCULAR | Status: DC | PRN
  Administered 2021-02-11: 20 mL

## 2021-02-11 MED ORDER — CEFAZOLIN SODIUM-DEXTROSE 2-4 GM/100ML-% IV SOLN
2.0000 g | INTRAVENOUS | Status: AC
  Administered 2021-02-11: 2 g via INTRAVENOUS

## 2021-02-11 MED ORDER — OXYCODONE HCL 5 MG/5ML PO SOLN: 5.0000 mg | Freq: Once | ORAL | Status: DC | PRN

## 2021-02-11 MED ORDER — FENTANYL CITRATE (PF) 100 MCG/2ML IJ SOLN: 25.0000 ug | INTRAMUSCULAR | Status: DC | PRN

## 2021-02-11 MED ORDER — FENTANYL CITRATE (PF) 100 MCG/2ML IJ SOLN
INTRAMUSCULAR | Status: DC | PRN
  Administered 2021-02-11 (×4): 25 ug via INTRAVENOUS

## 2021-02-11 MED ORDER — PROPOFOL 10 MG/ML IV BOLUS
INTRAVENOUS | Status: AC
  Filled 2021-02-11: qty 20

## 2021-02-11 MED ORDER — POTASSIUM CHLORIDE IN NACL 20-0.9 MEQ/L-% IV SOLN
INTRAVENOUS | Status: DC
  Filled 2021-02-11 (×5): qty 1000

## 2021-02-11 MED ORDER — LACTATED RINGERS IV SOLN: INTRAVENOUS | Status: DC | PRN

## 2021-02-11 MED ORDER — ONDANSETRON HCL 4 MG/2ML IJ SOLN
INTRAMUSCULAR | Status: AC
  Filled 2021-02-11: qty 2

## 2021-02-11 MED ORDER — SEVOFLURANE IN SOLN
RESPIRATORY_TRACT | Status: AC
  Filled 2021-02-11: qty 250

## 2021-02-11 MED ORDER — OXYCODONE HCL 5 MG PO TABS
5.0000 mg | ORAL_TABLET | Freq: Once | ORAL | Status: DC | PRN
Start: 2021-02-11 — End: 2021-02-11

## 2021-02-11 MED ORDER — MEPERIDINE HCL 25 MG/ML IJ SOLN: 6.2500 mg | INTRAMUSCULAR | Status: DC | PRN

## 2021-02-11 MED ORDER — TRAMADOL HCL 50 MG PO TABS: 50.0000 mg | ORAL_TABLET | Freq: Four times a day (QID) | ORAL | Status: DC | PRN

## 2021-02-11 MED ORDER — LIDOCAINE HCL (CARDIAC) PF 100 MG/5ML IV SOSY
PREFILLED_SYRINGE | INTRAVENOUS | Status: DC | PRN
  Administered 2021-02-11: 80 mg via INTRAVENOUS

## 2021-02-11 MED ORDER — FAMOTIDINE 20 MG PO TABS
ORAL_TABLET | ORAL | Status: AC
  Filled 2021-02-11: qty 1

## 2021-02-11 MED ORDER — EPHEDRINE SULFATE 50 MG/ML IJ SOLN
INTRAMUSCULAR | Status: DC | PRN
  Administered 2021-02-11: 5 mg via INTRAVENOUS

## 2021-02-11 MED ORDER — ONDANSETRON HCL 4 MG PO TABS: 4.0000 mg | ORAL_TABLET | Freq: Four times a day (QID) | ORAL | Status: DC | PRN

## 2021-02-11 MED ORDER — CHLORHEXIDINE GLUCONATE 0.12 % MT SOLN
15.0000 mL | Freq: Once | OROMUCOSAL | Status: AC
  Administered 2021-02-11: 15 mL via OROMUCOSAL

## 2021-02-11 MED ORDER — DEXAMETHASONE SODIUM PHOSPHATE 10 MG/ML IJ SOLN
INTRAMUSCULAR | Status: DC | PRN
  Administered 2021-02-11: 10 mg via INTRAVENOUS

## 2021-02-11 MED ORDER — METOCLOPRAMIDE HCL 10 MG PO TABS: 5.0000 mg | ORAL_TABLET | Freq: Three times a day (TID) | ORAL | Status: DC | PRN

## 2021-02-11 MED ORDER — ONDANSETRON HCL 4 MG/2ML IJ SOLN
INTRAMUSCULAR | Status: DC | PRN
  Administered 2021-02-11: 4 mg via INTRAVENOUS

## 2021-02-11 MED ORDER — FAMOTIDINE 20 MG PO TABS
20.0000 mg | ORAL_TABLET | Freq: Once | ORAL | Status: AC
  Administered 2021-02-11: 20 mg via ORAL

## 2021-02-11 MED ORDER — ACETAMINOPHEN 10 MG/ML IV SOLN
INTRAVENOUS | Status: DC | PRN
  Administered 2021-02-11: 1000 mg via INTRAVENOUS

## 2021-02-11 MED ORDER — ORAL CARE MOUTH RINSE: 15.0000 mL | Freq: Once | OROMUCOSAL | Status: AC

## 2021-02-11 MED ORDER — ACETAMINOPHEN 10 MG/ML IV SOLN
INTRAVENOUS | Status: AC
  Filled 2021-02-11: qty 100

## 2021-02-11 MED ORDER — CHLORHEXIDINE GLUCONATE 0.12 % MT SOLN
OROMUCOSAL | Status: AC
  Filled 2021-02-11: qty 15

## 2021-02-11 MED ORDER — PROMETHAZINE HCL 25 MG/ML IJ SOLN: 6.2500 mg | INTRAMUSCULAR | Status: DC | PRN

## 2021-02-11 MED ORDER — LIDOCAINE HCL (PF) 2 % IJ SOLN
INTRAMUSCULAR | Status: AC
  Filled 2021-02-11: qty 4

## 2021-02-11 MED ORDER — APREPITANT 40 MG PO CAPS
40.0000 mg | ORAL_CAPSULE | Freq: Once | ORAL | Status: AC
  Administered 2021-02-11: 40 mg via ORAL

## 2021-02-11 SURGICAL SUPPLY — 26 items
APL PRP STRL LF DISP 70% ISPRP (MISCELLANEOUS) ×1
BNDG CMPR STD VLCR NS LF 5.8X2 (GAUZE/BANDAGES/DRESSINGS) ×1
BNDG ELASTIC 2X5.8 VLCR NS LF (GAUZE/BANDAGES/DRESSINGS) ×2 IMPLANT
BNDG ESMARK 4X12 TAN STRL LF (GAUZE/BANDAGES/DRESSINGS) ×2 IMPLANT
CHLORAPREP W/TINT 26 (MISCELLANEOUS) ×2 IMPLANT
CORD BIP STRL DISP 12FT (MISCELLANEOUS) ×2 IMPLANT
COVER WAND RF STERILE (DRAPES) ×2 IMPLANT
CUFF TOURN SGL QUICK 18X4 (TOURNIQUET CUFF) ×2 IMPLANT
DRAPE SURG 17X11 SM STRL (DRAPES) ×4 IMPLANT
FORCEPS JEWEL BIP 4-3/4 STR (INSTRUMENTS) ×2 IMPLANT
GAUZE SPONGE 4X4 12PLY STRL (GAUZE/BANDAGES/DRESSINGS) ×2 IMPLANT
GAUZE XEROFORM 1X8 LF (GAUZE/BANDAGES/DRESSINGS) ×2 IMPLANT
GLOVE SURG ENC MOIS LTX SZ8 (GLOVE) ×4 IMPLANT
GLOVE SURG UNDER LTX SZ8 (GLOVE) ×2 IMPLANT
GOWN STRL REUS W/ TWL LRG LVL3 (GOWN DISPOSABLE) ×1 IMPLANT
GOWN STRL REUS W/ TWL XL LVL3 (GOWN DISPOSABLE) ×1 IMPLANT
GOWN STRL REUS W/TWL LRG LVL3 (GOWN DISPOSABLE) ×2
GOWN STRL REUS W/TWL XL LVL3 (GOWN DISPOSABLE) ×2
KIT TURNOVER KIT A (KITS) ×2 IMPLANT
MANIFOLD NEPTUNE II (INSTRUMENTS) ×2 IMPLANT
NEEDLE HYPO 25X1 1.5 SAFETY (NEEDLE) ×2 IMPLANT
NS IRRIG 500ML POUR BTL (IV SOLUTION) ×2 IMPLANT
PACK EXTREMITY ARMC (MISCELLANEOUS) ×2 IMPLANT
SPLINT WRIST LG LT TX990309 (SOFTGOODS) ×2 IMPLANT
STOCKINETTE IMPERVIOUS 9X36 MD (GAUZE/BANDAGES/DRESSINGS) ×2 IMPLANT
SUT PROLENE 4 0 PS 2 18 (SUTURE) ×2 IMPLANT

## 2021-02-11 NOTE — H&P (Addendum)
History of Present Illness:  Brianna Rogers is a 75 y.o. female who presents for evaluation and treatment of persistent catching of her left ring finger and, to a lesser extent, her left index finger. She notes that the symptoms interfere with her ability to perform repetitive activities with the left hand. She also notes intermittent numbness and paresthesias to the thumb, index, and long fingers of her left hand. The symptoms have persisted despite medications, activity modification, splinting, etc. She has an EMG documented carpal tunnel syndrome. She presents at this time for an endoscopic left carpal tunnel release with release of the left index and ring trigger fingers.  Current Outpatient Medications: . amLODIPine (NORVASC) 5 MG tablet Take 1 tablet (5 mg total) by mouth once daily 90 tablet 1  . aspirin 81 MG EC tablet Take 81 mg by mouth once daily.  . DENOSUMAB SUBQ Inject subcutaneously every 6 (six) months  . flash glucose scanning reader (FREESTYLE LIBRE 14 DAY READER) Misc Use 1 Device as directed 1 each 0  . FREESTYLE LIBRE 14 DAY SENSOR kit USE AS DIRECTED EVERY 14 DAYS 12 kit 1  . imiquimod (ALDARA) 5 % cream Apply topically as needed  . insulin GLARGINE (LANTUS SOLOSTAR U-100 INSULIN) pen injector (concentration 100 units/mL) Inject 40 Units subcutaneously once daily 40 mL 1  . insulin LISPRO (HUMALOG KWIKPEN INSULIN) pen injector (concentration 100 units/mL) ADMINISTER 14 UNITS UNDER THE SKIN WITH BREAKFAST AND SUPPER AS DIRECTED 20 mL 1  . letrozole (FEMARA) 2.5 mg tablet Take 2.5 mg by mouth once daily  . metFORMIN (GLUCOPHAGE) 1000 MG tablet TAKE 1 TABLET(1000 MG) BY MOUTH TWICE DAILY WITH MEALS 180 tablet 1  . multivitamin tablet Take 1 tablet by mouth once daily.  Marland Kitchen nystatin (MYCOSTATIN) 100,000 unit/gram powder Apply topically 3 (three) times daily as needed  . omega-3 fatty acids-fish oil 360-1,200 mg Cap Take 2,400 mg by mouth 2 (two) times daily  . pen needle, diabetic  (BD ULTRA-FINE SHORT PEN NEEDLE) 31 gauge x 5/16" needle Apply to insulin pen once daily for administering. 100 each 12  . PREVIDENT 5000 DRY MOUTH 1.1 % gel Place onto teeth nightly  . quinapriL (ACCUPRIL) 20 MG tablet Take 1 tablet (20 mg total) by mouth 2 (two) times daily 180 tablet 1  . rosuvastatin (CRESTOR) 5 MG tablet Take 1 tablet (5 mg total) by mouth every other day 45 tablet 1   Allergies:  . Fosamax [Alendronate] Itching  . Jardiance [Empagliflozin] Itching  . Nitrofurantoin Macrocrystal Other (Flu like symptoms)  . Sulfa (Sulfonamide Antibiotics) Other (Flu like symptoms)   Past Medical History:  . Allergy 2000  . Cardiac murmur 2542 - 2/6 systolic best heard at 2nd Lt ICS  . Carpal tunnel syndrome, bilateral 04/05/2020 - bilateral moderate (grade III) carpal tunnel syndrome on EMG  . Cherry angioma  . Deviated septum - s/p "scraping" procedure yrs ago, but sx returned  . Diabetes mellitus, type 2 (CMS-HCC) ~2003  . Diverticula of colon  . Hyperlipidemia, mixed ~2003  . Hypertension ~2003  . Lichen sclerosus 7062 - abdomen. Tx'd by derm.  . Malignant neoplasm of upper-outer quadrant of right breast in female, estrogen receptor positive (CMS-HCC) 12/21/2017 (s/p lumpectomy & XRT. On letrizole therapy)  . Microalbuminuric diabetic nephropathy (CMS-HCC)  . Osteopenia - Based on Dexa from 12/09/15 & again in 2019 (fem neck only)  . Seborrheic keratoses  . Shingles 2008  . Vitamin D deficiency 2014 - Tx'd & resolved  .  Wart - Tx'd w/ cryo x1 by Dermatology   Past Surgical History:  . Release right ring trigger finger Right 12/03/2020 (Dr. Roland Rack)  . Endoscopic right CTR and release right long TF (08/13/2020) - Dr. Roland Rack  . APPENDECTOMY ~1991 (w/ TAH)  . COLONOSCOPY 11/29/2009 (@ [facility] - Diverticulosis, FHPolyps(m), 5 yr rpt per provider)  . COLONOSCOPY 11/15/2017 (Tubular adenoma of the colon/Repeat 27yr/MUS)  . HYSTERECTOMY ~1991 (TAH w/ BSO 2/2 benign tumor on Lt  ovary)  . MASTECTOMY, PARTIAL Right 12/06/2017 - Dr. CPeyton Najjarw/ sentinel node  . PERCUTANEOUS BIOPSY BREAST W/NEEDLE LOCALIZATION Right (11/18/2017) - w/ marker chip placement  . TUBAL LIGATION ~1986   Family History:  . Diabetes type II Mother  . Hypothyroidism Mother  . High blood pressure (Hypertension) Mother  . Heart disease Mother  . Breast cancer Mother (s/p Lt mastectomy)  . Melanoma Mother  . Hyperlipidemia (Elevated cholesterol) Mother  . Colon polyps Mother  . Stroke Mother  . Diabetes Mother  . Thyroid disease Mother  . Heart disease Father  . High blood pressure (Hypertension) Father  . Hyperlipidemia (Elevated cholesterol) Father  . Stroke Father  . Sudden cardiac death Father - 152  Social History:   Socioeconomic History:  .Marland KitchenMarital status: Single  . Number of children: 0  Tobacco Use  . Smoking status: Never Smoker  . Smokeless tobacco: Never Used  Vaping Use  . Vaping Use: Never used  Substance and Sexual Activity  . Alcohol use: Never  . Drug use: Never  . Sexual activity: Not Currently  Partners: Male  Birth control/protection: None  Social History Narrative  [place], as mother's health requires increased care giving & night time assistance. Still has her residence in [place] Plans to sell her house & move into mother's house full-time when mother passes.   [place]   Review of Systems:  A comprehensive 14 point ROS was performed, reviewed, and the pertinent orthopaedic findings are documented in the HPI.  Physical Exam: Vitals:  01/22/21 0954  BP: 128/86  Weight: 74.9 kg (165 lb 3.2 oz)  Height: 149.9 cm ('4\' 11"' )  PainSc: 0-No pain  PainLoc: Finger   General/Constitutional: The patient appears to be well-nourished, well-developed, and in no acute distress. Neuro/Psych: Normal mood and affect, oriented to person, place and time.  Eyes:  Non-icteric. Pupils are equal, round, and reactive to light, and exhibit synchronous movement. ENT: Unremarkable. Lymphatic: No palpable adenopathy. Respiratory: Lungs clear to auscultation, Normal chest excursion, No wheezes and Non-labored breathing Cardiovascular: Regular rate and rhythm. No murmurs. and No edema, swelling or tenderness, except as noted in detailed exam. Integumentary: No impressive skin lesions present, except as noted in detailed exam. Musculoskeletal: Unremarkable, except as noted in detailed exam.  Left hand exam: Skin inspection of the left hand is unremarkable. No erythema, ecchymosis, abrasions, or other skin abnormalities are identified. She experiences mild-moderate tenderness to palpation over the volar aspects of the index and ring MCP joints. She is able to actively flex extend all digits fully without any pain. However, she exhibits obvious triggering with ring finger extension. She has a small area of fullness palpable over the index and ring metacarpal heads. This area moves as she flexes and extends each finger, consistent with a trigger finger. She remains neurovascularly intact to all digits. She has a positive Phalen's test and a negative Tinel's over the  carpal tunnel.  Assessment: 1. Left carpal tunnel syndrome 2. Trigger index finger of left hand  3. Trigger finger, left ring finger   Plan: The treatment options were discussed with the patient. In addition, patient educational materials were provided regarding the diagnosis and treatment options. Regarding her left hand, the patient is frustrated by her persistent painful catching involving the ring and index fingers. She would like to consider more aggressive treatment of these issues. Therefore, I have recommended a surgical procedure, specifically release of the left index and ring fingers. The procedure was discussed with the patient, as were the potential risks (including bleeding, infection, nerve  and/or blood vessel injury, persistent or recurrent pain/catching, stiffness of the finger, need for further surgery, blood clots, strokes, heart attacks and/or arhythmias, pneumonia, etc.) and benefits. The patient states his/her understanding and wishes to proceed. All of the patient's questions and concerns were answered. She can call any time with further concerns. She will follow up post-surgery, routine.   H&P reviewed and patient re-examined. No changes.

## 2021-02-11 NOTE — Anesthesia Preprocedure Evaluation (Signed)
Anesthesia Evaluation  Patient identified by MRN, date of birth, ID band Patient awake    Reviewed: Allergy & Precautions, NPO status , Patient's Chart, lab work & pertinent test results  History of Anesthesia Complications (+) PONV and history of anesthetic complications  Airway Mallampati: III  TM Distance: >3 FB Neck ROM: Full    Dental no notable dental hx.    Pulmonary neg pulmonary ROS, neg sleep apnea, neg COPD,    breath sounds clear to auscultation- rhonchi (-) wheezing      Cardiovascular hypertension, Pt. on medications (-) CAD, (-) Past MI, (-) Cardiac Stents and (-) CABG  Rhythm:Regular Rate:Normal - Systolic murmurs and - Diastolic murmurs    Neuro/Psych neg Seizures negative neurological ROS  negative psych ROS   GI/Hepatic negative GI ROS, Neg liver ROS,   Endo/Other  diabetes, Insulin Dependent  Renal/GU negative Renal ROS     Musculoskeletal negative musculoskeletal ROS (+)   Abdominal (+) + obese,   Peds  Hematology negative hematology ROS (+)   Anesthesia Other Findings Past Medical History: No date: Breast cancer ([place]) 11/18/2017: Breast cancer, right ([place]) No date: Chronic kidney disease No date: Complication of anesthesia No date: Diabetes mellitus without complication (HCC) No date: Heart murmur No date: Hypertension No date: Lichen sclerosus et atrophicus No date: Personal history of radiation therapy No date: PONV (postoperative nausea and vomiting)   Reproductive/Obstetrics                             Anesthesia Physical Anesthesia Plan  ASA: II  Anesthesia Plan: General   Post-op Pain Management:    Induction: Intravenous  PONV Risk Score and Plan: 3 and Ondansetron, Aprepitant and Dexamethasone  Airway Management Planned: LMA  Additional Equipment:   Intra-op Plan:   Post-operative Plan:   Informed Consent: I have reviewed the patients  History and Physical, chart, labs and discussed the procedure including the risks, benefits and alternatives for the proposed anesthesia with the patient or authorized representative who has indicated his/her understanding and acceptance.     Dental advisory given  Plan Discussed with: CRNA and Anesthesiologist  Anesthesia Plan Comments:         Anesthesia Quick Evaluation

## 2021-02-11 NOTE — Discharge Instructions (Addendum)
Orthopedic discharge instructions: Keep dressing dry and intact. Keep hand elevated above heart level. May shower after dressing removed on postop day 4 (Saturday). Cover sutures with Band-Aids after drying off. Apply ice to affected area frequently. Take Meloxicam 15 mg daily OR ibuprofen 600-800 mg TID with meals for 7-10 days, then as necessary. Take ES Tylenol or Tramadol as prescribed when needed.  Return for follow-up in 10-14 days or as scheduled.  AMBULATORY SURGERY  DISCHARGE INSTRUCTIONS   1) The drugs that you were given will stay in your system until tomorrow so for the next 24 hours you should not:  A) Drive an automobile B) Make any legal decisions C) Drink any alcoholic beverage   2) You may resume regular meals tomorrow.  Today it is better to start with liquids and gradually work up to solid foods.  You may eat anything you prefer, but it is better to start with liquids, then soup and crackers, and gradually work up to solid foods.   3) Please notify your doctor immediately if you have any unusual bleeding, trouble breathing, redness and pain at the surgery site, drainage, fever, or pain not relieved by medication.    4) Additional Instructions:        Please contact your physician with any problems or Same Day Surgery at [phone], Monday through Friday 6 am to 4 pm, or [place] at [facility] number at [phone].

## 2021-02-11 NOTE — Op Note (Signed)
02/11/2021  3:15 PM  Patient:   Brianna Rogers  Pre-Op Diagnosis:   1.  Left carpal tunnel syndrome.  2.  Left index and ring trigger fingers.  Post-Op Diagnosis:   Same  Procedure:   1.  Endoscopic left carpal tunnel release.  2.  Release of left index and ring trigger fingers.  Surgeon:   Pascal Lux, MD  Anesthesia:   General LMA  Findings:   As above.  Complications:   None  EBL:   0 cc  Fluids:   500 cc crystalloid  TT:   32 minutes at 250 mmHg  Drains:   None  Closure:   4-0 Prolene interrupted sutures  Brief Clinical Note:   The patient is a 75 year old female with a history of progressively worsening numbness and paresthesias to her left hand. Her symptoms have persisted despite medications, activity modification, etc. Her history and examination are consistent with left carpal tunnel syndrome, confirmed by EMG. The patient presents at this time for an endoscopic left carpal tunnel release.  The patient also notes a history of painful catching of her left ring more so than left index fingers. The symptoms have persisted despite activity modification, medications, etc. Her history and examination are consistent with left ring and index trigger fingers. She presents at this time for release of the left index and ring trigger fingers.  Procedure:   The patient was brought into the operating room and lain in the supine position. After adequate general laryngeal mask anesthesia was obtained, the left hand and upper extremity were prepped with ChloraPrep solution before being draped sterilely. Preoperative antibiotics were administered. A timeout was performed to verify the appropriate surgical site before the limb was exsanguinated with an Esmarch and the tourniquet inflated to 250 mmHg.   An approximately 1.5-2 cm incision was made over the volar wrist flexion crease, centered over the palmaris longus tendon. The incision was carried down through the subcutaneous tissues  with care taken to identify and protect any neurovascular structures. The distal forearm fascia was penetrated just proximal to the transverse carpal ligament. The soft tissues were released off the superficial and deep surfaces of the distal forearm fascia and this was released proximally for 3-4 cm under direct visualization.  Attention was directed distally. The Soil scientist was passed beneath the transverse carpal ligament along the ulnar aspect of the carpal tunnel and used to release any adhesions as well as to remove any adherent synovial tissue before first the smaller then the larger of the two dilators were passed beneath the transverse carpal ligament along the ulnar margin of the carpal tunnel. The slotted cannula was introduced and the endoscope was placed into the slotted cannula and the undersurface of the transverse carpal ligament visualized. The distal margin of the transverse carpal ligament was marked by placing a 25-gauge needle percutaneously at [place] cardinal point so that it entered the distal portion of the slotted cannula. Under endoscopic visualization, the transverse carpal ligament was released from proximal to distal using the end-cutting blade. A second pass was performed to ensure complete release of the ligament. The adequacy of release was verified both endoscopically and by palpation using the freer elevator.  Next, the left index finger was addressed. An approximately 1.5-2.0 cm incision was made over the volar aspect of the left index finger at the level of the metacarpal head centered over the flexor sheath. The incision was carried down through the subcutaneous tissues with care taken to identify  and protect the digital neurovascular structures. The flexor sheath was entered just proximal to the A1 pulley. The sheath was released proximally for several centimeters under direct visualization. Distally, a clamp was placed beneath the A1 pulley and used to release any  adhesions. The clamp was repositioned so that one jaw was superficial to and the other jaw deep to the A1 pulley. The A1 pulley was incised on either side of the clamp to remove a 2 mm strip of tissue. Metzenbaum scissors were used to ensure complete release of the A1 pulley more distally. The underlying tendons were carefully inspected and found to be intact.  Finally, the left ring finger was addressed. An approximately 1.5-2.0 cm incision was made over the volar aspect of the left ring finger at the level of the metacarpal head centered over the flexor sheath. The incision was carried down through the subcutaneous tissues with care taken to identify and protect the digital neurovascular structures. The flexor sheath was entered just proximal to the A1 pulley. The sheath was released proximally for several centimeters under direct visualization. Distally, a clamp was placed beneath the A1 pulley and used to release any adhesions. The clamp was repositioned so that one jaw was superficial to and the other jaw deep to the A1 pulley. The A1 pulley was incised on either side of the clamp to remove a 2 mm strip of tissue. Metzenbaum scissors were used to ensure complete release of the A1 pulley more distally. The underlying tendons were carefully inspected and found to be intact.  Each wound was irrigated thoroughly with sterile saline solution before being closed using 4-0 Prolene interrupted sutures. A total of 20 cc of 0.5% plain Sensorcaine was injected in and around the incisions before a sterile bulky dressing was applied to the hand. The patient was placed into a volar wrist splint before being awakened, extubated, and returned to the recovery room in satisfactory condition after tolerating the procedure well.

## 2021-02-11 NOTE — Transfer of Care (Signed)
Immediate Anesthesia Transfer of Care Note  Patient: Brianna Rogers  Procedure(s) Performed: RELEASE OF LEFT INDEX AND RING TRIGGER FINGERS AND ENDOSCOPIC LEFT CARPAL TUNNEL RELEASE (Left Finger)  Patient Location: PACU  Anesthesia Type:General  Level of Consciousness: awake, alert  and oriented  Airway & Oxygen Therapy: Patient Spontanous Breathing and Patient connected to face mask oxygen  Post-op Assessment: Report given to RN and Post -op Vital signs reviewed and stable  Post vital signs: stable  Last Vitals:  Vitals Value Taken Time  BP 162/64 02/11/21 1500  Temp    Pulse 75 02/11/21 1503  Resp 15 02/11/21 1503  SpO2 99 % 02/11/21 1503  Vitals shown include unvalidated device data.  Last Pain:  Vitals:   02/11/21 1231  TempSrc:   PainSc: 0-No pain         Complications: No complications documented.

## 2021-02-12 ENCOUNTER — Encounter: Payer: Self-pay | Admitting: Surgery

## 2021-02-12 NOTE — Anesthesia Postprocedure Evaluation (Signed)
Anesthesia Post Note  Patient: Kolbee Stallman  Procedure(s) Performed: RELEASE OF LEFT INDEX AND RING TRIGGER FINGERS AND ENDOSCOPIC LEFT CARPAL TUNNEL RELEASE (Left Finger)  Patient location during evaluation: PACU Anesthesia Type: General Level of consciousness: awake and alert and oriented Pain management: pain level controlled Vital Signs Assessment: post-procedure vital signs reviewed and stable Respiratory status: spontaneous breathing, nonlabored ventilation and respiratory function stable Cardiovascular status: blood pressure returned to baseline and stable Postop Assessment: no signs of nausea or vomiting Anesthetic complications: no   No complications documented.   Last Vitals:  Vitals:   02/11/21 1544 02/11/21 1623  BP: (!) 163/64 (!) 163/57  Pulse: 68 64  Resp: 16 16  Temp: 36.5 C   SpO2: 95% 97%    Last Pain:  Vitals:   02/11/21 1623  TempSrc:   PainSc: 0-No pain                 Fines Kimberlin

## 2021-05-02 ENCOUNTER — Other Ambulatory Visit: Payer: Self-pay

## 2021-05-02 DIAGNOSIS — C50911 Malignant neoplasm of unspecified site of right female breast: Secondary | ICD-10-CM

## 2021-05-02 DIAGNOSIS — Z170 Estrogen receptor positive status [ER+]: Secondary | ICD-10-CM

## 2021-05-05 ENCOUNTER — Ambulatory Visit: Payer: Medicare Other | Admitting: Hematology and Oncology

## 2021-05-05 ENCOUNTER — Ambulatory Visit: Payer: Medicare Other

## 2021-05-05 ENCOUNTER — Other Ambulatory Visit: Payer: Medicare Other

## 2021-05-09 ENCOUNTER — Inpatient Hospital Stay: Payer: Medicare Other | Attending: Nurse Practitioner

## 2021-05-09 ENCOUNTER — Encounter: Payer: Self-pay | Admitting: Nurse Practitioner

## 2021-05-09 ENCOUNTER — Other Ambulatory Visit: Payer: Self-pay

## 2021-05-09 ENCOUNTER — Inpatient Hospital Stay (HOSPITAL_BASED_OUTPATIENT_CLINIC_OR_DEPARTMENT_OTHER): Payer: Medicare Other | Admitting: Nurse Practitioner

## 2021-05-09 ENCOUNTER — Inpatient Hospital Stay: Payer: Medicare Other

## 2021-05-09 VITALS — BP 161/73 | HR 73 | Temp 98.1°F | Resp 16 | Wt 163.6 lb

## 2021-05-09 DIAGNOSIS — C50911 Malignant neoplasm of unspecified site of right female breast: Secondary | ICD-10-CM | POA: Insufficient documentation

## 2021-05-09 DIAGNOSIS — Z170 Estrogen receptor positive status [ER+]: Secondary | ICD-10-CM | POA: Diagnosis not present

## 2021-05-09 DIAGNOSIS — Z79811 Long term (current) use of aromatase inhibitors: Secondary | ICD-10-CM | POA: Insufficient documentation

## 2021-05-09 DIAGNOSIS — M85852 Other specified disorders of bone density and structure, left thigh: Secondary | ICD-10-CM

## 2021-05-09 DIAGNOSIS — Z853 Personal history of malignant neoplasm of breast: Secondary | ICD-10-CM | POA: Insufficient documentation

## 2021-05-09 DIAGNOSIS — Z08 Encounter for follow-up examination after completed treatment for malignant neoplasm: Secondary | ICD-10-CM

## 2021-05-09 DIAGNOSIS — M85859 Other specified disorders of bone density and structure, unspecified thigh: Secondary | ICD-10-CM | POA: Diagnosis not present

## 2021-05-09 DIAGNOSIS — M8580 Other specified disorders of bone density and structure, unspecified site: Secondary | ICD-10-CM | POA: Diagnosis not present

## 2021-05-09 LAB — COMPREHENSIVE METABOLIC PANEL
ALT: 23 U/L (ref 0–44)
AST: 21 U/L (ref 15–41)
Albumin: 3.8 g/dL (ref 3.5–5.0)
Alkaline Phosphatase: 52 U/L (ref 38–126)
Anion gap: 8 (ref 5–15)
BUN: 37 mg/dL — ABNORMAL HIGH (ref 8–23)
CO2: 24 mmol/L (ref 22–32)
Calcium: 10 mg/dL (ref 8.9–10.3)
Chloride: 103 mmol/L (ref 98–111)
Creatinine, Ser: 1.03 mg/dL — ABNORMAL HIGH (ref 0.44–1.00)
GFR, Estimated: 58 mL/min — ABNORMAL LOW (ref >60)
Glucose, Bld: 275 mg/dL — ABNORMAL HIGH (ref 70–99)
Potassium: 4.4 mmol/L (ref 3.5–5.1)
Sodium: 135 mmol/L (ref 135–145)
Total Bilirubin: 0.7 mg/dL (ref 0.3–1.2)
Total Protein: 7.3 g/dL (ref 6.5–8.1)

## 2021-05-09 LAB — CBC WITH DIFFERENTIAL/PLATELET
Abs Immature Granulocytes: 0.06 10*3/uL (ref 0.00–0.07)
Basophils Absolute: 0 10*3/uL (ref 0.0–0.1)
Basophils Relative: 0 %
Eosinophils Absolute: 0.3 10*3/uL (ref 0.0–0.5)
Eosinophils Relative: 3 %
HCT: 36.4 % (ref 36.0–46.0)
Hemoglobin: 12.8 g/dL (ref 12.0–15.0)
Immature Granulocytes: 1 %
Lymphocytes Relative: 19 %
Lymphs Abs: 1.6 10*3/uL (ref 0.7–4.0)
MCH: 30.5 pg (ref 26.0–34.0)
MCHC: 35.2 g/dL (ref 30.0–36.0)
MCV: 86.7 fL (ref 80.0–100.0)
Monocytes Absolute: 0.7 10*3/uL (ref 0.1–1.0)
Monocytes Relative: 8 %
Neutro Abs: 5.7 10*3/uL (ref 1.7–7.7)
Neutrophils Relative %: 69 %
Platelets: 230 10*3/uL (ref 150–400)
RBC: 4.2 MIL/uL (ref 3.87–5.11)
RDW: 12.5 % (ref 11.5–15.5)
WBC: 8.3 10*3/uL (ref 4.0–10.5)
nRBC: 0 % (ref 0.0–0.2)

## 2021-05-09 MED ORDER — DENOSUMAB 60 MG/ML ~~LOC~~ SOSY
60.0000 mg | PREFILLED_SYRINGE | Freq: Once | SUBCUTANEOUS | Status: AC
  Administered 2021-05-09: 60 mg via SUBCUTANEOUS

## 2021-05-09 NOTE — Progress Notes (Signed)
[facility]  [address] [place], [place] [address] Phone: [phone]  Fax: [phone]   Clinic Day:  05/09/2021  Referring physician: Sallee Lange, *  Chief Complaint: Brianna Rogers is a 75 y.o. female with stage IA right breast cancer and osteopenia who is seen for 6 month assessment.  History of Presenting Illness: Brianna Rogers is a 75 y.o. female with stage IA right breast cancer s/p lumpectomy on 12/06/2017.  Pathology revealed a 1.7 cm grade III invasive mammary carcinoma of no special type.  There were biopsy site changes and a clip present.  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  Caudal margin re-excision revealed ductal and lobular carcinoma in situ.  In situ carcinoma was < 0.5 mm from the margin.  Thermal artifact limited interpretation.  New skin margin revealed in situ carcinoma < 0.5 mm from the surgical margin.  There was lymphovascular invasion.  One sentinel lymph node was negative.  Tumor was ER + (> 90%), PR + (> 90%) and Her2/neu 2+ (FISH negative).  Pathologic stage was pT1cN0.   Oncotype DX revealed a recurrence score of 9 (low risk).  Distant recurrencce risk at 9 years with hormonal therapy was 3%.  Absolute benefit of chemotherapy was < 1%.   Diagnostic right mammogram and ultrasound on 11/11/2017 revealed a suspicious mass in the 9 o'clock position of the right breast.  Targeted ultrasound revealed a 1.5 x 2.2 x 1.9 cm mass with posterior acoustic shadowing 10 cm from the nipple.  The right axilla was negative for adenopathy.  Bilateral mammogram on 10/27/2018 revealed no evidence of malignancy in either breast.  Bilateral diagnostic mammogram on 10/30/2019 revealed no evidence of malignancy with expected postsurgical and post radiation changes in the right breast.  Bilateral diagnostic mammogram on 10/31/2020 revealed no mammographic evidence of malignancy in either breast.    She received radiation from  01/27/2018 - 03/03/2018.  She began Femara on 03/15/2018.  She switched to Aromasin on 06/09/2019 and has been back on Femara.  She was off Femara from 02/13/2020 - 03/07/2020.   CA27.29 has been followed: 24.7 on 11/24/2017, 22.5 on 03/15/2018, 16.0 on 10/31/2018, 22.6 on 04/05/2019, 17.4 on 08/01/2019, 21.7 on 11/02/2019, 25.6 on 11/29/2019 and 15.7 on 05/01/2020.   Bone density on 12/09/2015 revealed osteopenia with a T-score of -1.3 in the left hip.  Bone density on 12/14/2017 revealed osteopenia with a T-score of -1.4 in the left femoral neck.  Bone density on 12/18/2019 revealed osteopenia with a T-score of -1.4 at the left femur neck.  She began Prolia on 04/15/2018 (last 08/13/2020).   She has a family history of breast cancer x 4 (mother and 3 paternal aunts) and "female cancer" (maternal great grandmother).  [facility] genetic testing was negative for BRCA1/2.  Interval History: Patient returns to clinic for labs, routine follow-up, and consideration of Prolia. She feels well. She continues to perform self breast exams. No new lumps or bumps. No new aches or pains. No fevers or chills. Appetite is good and she denies abnormal weight loss. Has decreased her blood sugars by working with her pcp. Denies any neurologic complaints. Denies chest pain. Denies any nausea, vomiting, constipation, or diarrhea. Denies urinary complaints. Patient offers no further specific complaints today.  Past Medical History:  Diagnosis Date   Breast cancer [facility])    Breast cancer, right ([place]) 11/18/2017   Chronic kidney disease    Complication of anesthesia    Diabetes  mellitus without complication (HCC)    Heart murmur    Hypertension    Lichen sclerosus et atrophicus    Personal history of radiation therapy    PONV (postoperative nausea and vomiting)     Past Surgical History:  Procedure Laterality Date   ABDOMINAL HYSTERECTOMY     APPENDECTOMY     BREAST BIOPSY Right 11/18/2017   INVASIVE MAMMARY  CARCINOMA.    BREAST LUMPECTOMY Right 12/06/2017   invasive mammary carcinoma DCIS LCIS  with RAD   CARPAL TUNNEL RELEASE Right 08/13/2020   Procedure: CARPAL TUNNEL RELEASE ENDOSCOPIC WITH RELEASE OF RIGHT LONG TRIGGER FINGER;  Surgeon: Corky Mull, MD;  Location: [facility] ORS;  Service: Orthopedics;  Laterality: Right;   COLONOSCOPY     COLONOSCOPY WITH PROPOFOL N/A 11/15/2017   Procedure: COLONOSCOPY WITH PROPOFOL;  Surgeon: Lollie Sails, MD;  Location: [facility] ENDOSCOPY;  Service: Endoscopy;  Laterality: N/A;   PARTIAL MASTECTOMY WITH NEEDLE LOCALIZATION Right 12/06/2017   Procedure: PARTIAL MASTECTOMY WITH NEEDLE LOCALIZATION;  Surgeon: Herbert Pun, MD;  Location: [facility] ORS;  Service: General;  Laterality: Right;   SENTINEL NODE BIOPSY Right 12/06/2017   Procedure: SENTINEL NODE BIOPSY;  Surgeon: Herbert Pun, MD;  Location: [facility] ORS;  Service: General;  Laterality: Right;   SEPTOPLASTY     TRIGGER FINGER RELEASE Right 12/03/2020   Procedure: RELEASE TRIGGER FINGER/A-1 PULLEY;  Surgeon: Corky Mull, MD;  Location: [facility] ORS;  Service: Orthopedics;  Laterality: Right;   TRIGGER FINGER RELEASE Left 02/11/2021   Procedure: RELEASE OF LEFT INDEX AND RING TRIGGER FINGERS AND ENDOSCOPIC LEFT CARPAL TUNNEL RELEASE;  Surgeon: Corky Mull, MD;  Location: [facility] ORS;  Service: Orthopedics;  Laterality: Left;   TUBAL LIGATION      Family History  Problem Relation Age of Onset   Breast cancer Mother 78   Cancer Mother    Cancer Maternal Aunt    Cancer Maternal Grandmother     Social History:  reports that she has never smoked. She has never used smokeless tobacco. She reports current alcohol use. She reports that she does not use drugs.  She is an only child. Her mother passed away in Jan 20, 2019. She lives in [place]. Patient is retired from [employer]. The patient is alone today.  Allergies:  Allergies  Allergen Reactions   Alendronate Itching   Bactrim  [Sulfamethoxazole-Trimethoprim] Nausea And Vomiting   Empagliflozin Itching    And constant UTIs   Macrodantin [Nitrofurantoin Macrocrystal] Other (See Comments)    Flu like symptoms    Sulfa Antibiotics Other (See Comments)    Flu like symptoms   Latex Itching    Current Medications: Current Outpatient Medications  Medication Sig Dispense Refill   acetaminophen (TYLENOL) 500 MG tablet Take 500 mg by mouth every 6 (six) hours as needed for moderate pain.     amLODipine (NORVASC) 5 MG tablet Take 5 mg by mouth every morning.     aspirin EC 81 MG tablet Take 81 mg by mouth every evening.      B-D ULTRAFINE III SHORT PEN 31G X 8 MM MISC U UTD     Continuous Blood Gluc Sensor (FREESTYLE LIBRE SENSOR SYSTEM) MISC Use 3 each every 10 (ten) days     denosumab (PROLIA) 60 MG/ML SOSY injection Inject 60 mg into the skin every 6 (six) months.     imiquimod (ALDARA) 5 % cream Apply 1 application topically daily as needed (warts).     insulin lispro (HUMALOG)  100 UNIT/ML injection Inject 14 Units into the skin 2 (two) times daily before a meal.     LANTUS SOLOSTAR 100 UNIT/ML Solostar Pen Inject 40 Units into the skin every morning.     letrozole (FEMARA) 2.5 MG tablet TAKE 1 TABLET BY MOUTH EVERY DAY (Patient taking differently: Take 2.5 mg by mouth every evening.) 90 tablet 0   meloxicam (MOBIC) 15 MG tablet Take 15 mg by mouth daily.     metFORMIN (GLUCOPHAGE) 1000 MG tablet Take 1,000 mg by mouth 2 (two) times daily with a meal.     Multiple Vitamin (MULTIVITAMIN) tablet Take 1 tablet by mouth daily.     neomycin-bacitracin-polymyxin (NEOSPORIN) ointment Apply 1 application topically as needed for wound care.     nystatin (MYCOSTATIN/NYSTOP) powder Apply 1 application topically daily as needed (yeast).     Omega-3 Fatty Acids (FISH OIL) 1200 MG CAPS Take 2,400 mg by mouth 2 (two) times daily.      PREVIDENT 5000 DRY MOUTH 1.1 % GEL dental gel Place 1 application onto teeth at bedtime.      PREVIDENT 5000 SENSITIVE 1.1-5 % GEL Place onto teeth.     quinapril (ACCUPRIL) 20 MG tablet Take 20 mg by mouth 2 (two) times daily.     rosuvastatin (CRESTOR) 5 MG tablet Take 5 mg by mouth every other day. In the evening     No current facility-administered medications for this visit.   Review of Systems  Constitutional:  Negative for chills, fever, malaise/fatigue and weight loss.  HENT:  Negative for hearing loss, nosebleeds, sore throat and tinnitus.   Eyes:  Negative for blurred vision and double vision.  Respiratory:  Negative for cough, hemoptysis, shortness of breath and wheezing.   Cardiovascular:  Negative for chest pain, palpitations and leg swelling.  Gastrointestinal:  Negative for abdominal pain, blood in stool, constipation, diarrhea, melena, nausea and vomiting.  Genitourinary:  Negative for dysuria and urgency.  Musculoskeletal:  Negative for back pain, falls, joint pain and myalgias.  Skin:  Negative for itching and rash.  Neurological:  Negative for dizziness, tingling, sensory change, loss of consciousness, weakness and headaches.  Endo/Heme/Allergies:  Negative for environmental allergies. Does not bruise/bleed easily.  Psychiatric/Behavioral:  Negative for depression. The patient is not nervous/anxious and does not have insomnia.     Performance status (ECOG): 0  Vitals Weight 163 lb 9.3 oz (74.2 kg).  Physical Exam Nursing note reviewed.  Constitutional:      Appearance: She is not ill-appearing.  HENT:     Head: Normocephalic.  Eyes:     General: No scleral icterus.    Conjunctiva/sclera: Conjunctivae normal.  Cardiovascular:     Rate and Rhythm: Normal rate and regular rhythm.     Pulses: Normal pulses.  Pulmonary:     Effort: Pulmonary effort is normal. No respiratory distress.     Breath sounds: Normal breath sounds.  Abdominal:     General: There is no distension.     Tenderness: There is no abdominal tenderness. There is no guarding.   Musculoskeletal:        General: No swelling or deformity.     Cervical back: Neck supple.  Lymphadenopathy:     Cervical: No cervical adenopathy.  Skin:    General: Skin is warm and dry.     Coloration: Skin is not pale.     Findings: No rash.  Neurological:     Mental Status: She is alert and oriented to person, place,  and time.  Psychiatric:        Behavior: Behavior normal.        Thought Content: Thought content normal.        Judgment: Judgment normal.  Breast exam was performed in seated and lying down position. Patient is status post right lumpectomy with a well-healed surgical scar. No evidence of any palpable masses. No evidence of axillary adenopathy. No evidence of any palpable masses or lumps in the left breast. No evidence of left axillary adenopathy   No visits with results within 3 Day(s) from this visit.  Latest known visit with results is:  Admission on 02/11/2021, Discharged on 02/11/2021  Component Date Value Ref Range Status   Glucose-Capillary 02/11/2021 246 (A) 70 - 99 mg/dL Final   Glucose reference range applies only to samples taken after fasting for at least 8 hours.   Glucose-Capillary 02/11/2021 204 (A) 70 - 99 mg/dL Final   Glucose reference range applies only to samples taken after fasting for at least 8 hours.    Assessment & Plan:   1.   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 Pathology revealed 1.7 cm grade III invasive mammary carcinoma of now special type. 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 She underwent re-excision of margin. Persistent positive margin on re-excision with in situ carcinoma < 0.5 mm from margin. There was lymphovascular invasion. One SLN was negative. No chemotherapy based on oncotype. She received radiation completed 03/03/18. Started AI therapy 03/15/18. Mammogram from 10/31/20 was reviewed today, no radiographic evidence of disease. Clinically asymptomatic. No  palpable masses on exam today. Continue femara daily given er/pr positivity of her cancer for at least 5 years and may consider extended adjuvant therapy. She is now 3.5 years from her diagnosis. Continue 6 month surveillance.   2.   Osteopenia- osteopenia present on dexa scan prior to cancer diagnosis or AI treatment. She receives prolia every 6 months. Last bone density scan was 12/18/2019. She continues weight bearing exercise. Labs reviewed and ok for prolia today.   Disposition: Prolia today Mammogram around 10/31/2021 6 months (labs- cbc, cmp, ca27.29), MD to establish care (patient requests Dr. Rogue Bussing), prolia- la  I discussed the assessment and treatment plan with the patient.  The patient was provided an opportunity to ask questions and all were answered.  The patient agreed with the plan and demonstrated an understanding of the instructions.  The patient was advised to call back if the symptoms worsen or if the condition fails to improve as anticipated.  Beckey Rutter, DNP, AGNP-C [place] at [facility] [phone] (clinic)

## 2021-05-10 LAB — CANCER ANTIGEN 27.29: CA 27.29: 18.2 U/mL (ref 0.0–38.6)

## 2021-05-17 ENCOUNTER — Emergency Department: Payer: Medicare Other

## 2021-05-17 ENCOUNTER — Ambulatory Visit
Admission: EM | Admit: 2021-05-17 | Discharge: 2021-05-17 | Disposition: A | Discharge status: Other Acute Inpatient Hospital | Payer: Medicare Other | Attending: Emergency Medicine | Admitting: Emergency Medicine

## 2021-05-17 ENCOUNTER — Emergency Department
Admission: EM | Admit: 2021-05-17 | Discharge: 2021-05-17 | Disposition: A | Discharge status: Home / Self Care | Payer: Medicare Other | Attending: Emergency Medicine | Admitting: Emergency Medicine

## 2021-05-17 ENCOUNTER — Other Ambulatory Visit: Payer: Self-pay

## 2021-05-17 DIAGNOSIS — Z79899 Other long term (current) drug therapy: Secondary | ICD-10-CM | POA: Diagnosis not present

## 2021-05-17 DIAGNOSIS — Z853 Personal history of malignant neoplasm of breast: Secondary | ICD-10-CM | POA: Insufficient documentation

## 2021-05-17 DIAGNOSIS — Z794 Long term (current) use of insulin: Secondary | ICD-10-CM | POA: Insufficient documentation

## 2021-05-17 DIAGNOSIS — R55 Syncope and collapse: Secondary | ICD-10-CM | POA: Insufficient documentation

## 2021-05-17 DIAGNOSIS — I160 Hypertensive urgency: Secondary | ICD-10-CM

## 2021-05-17 DIAGNOSIS — Z7982 Long term (current) use of aspirin: Secondary | ICD-10-CM | POA: Diagnosis not present

## 2021-05-17 DIAGNOSIS — I129 Hypertensive chronic kidney disease with stage 1 through stage 4 chronic kidney disease, or unspecified chronic kidney disease: Secondary | ICD-10-CM | POA: Insufficient documentation

## 2021-05-17 DIAGNOSIS — N189 Chronic kidney disease, unspecified: Secondary | ICD-10-CM | POA: Insufficient documentation

## 2021-05-17 DIAGNOSIS — R42 Dizziness and giddiness: Secondary | ICD-10-CM | POA: Diagnosis not present

## 2021-05-17 DIAGNOSIS — Z7984 Long term (current) use of oral hypoglycemic drugs: Secondary | ICD-10-CM | POA: Insufficient documentation

## 2021-05-17 DIAGNOSIS — I10 Essential (primary) hypertension: Secondary | ICD-10-CM

## 2021-05-17 DIAGNOSIS — R9431 Abnormal electrocardiogram [ECG] [EKG]: Secondary | ICD-10-CM

## 2021-05-17 DIAGNOSIS — E1122 Type 2 diabetes mellitus with diabetic chronic kidney disease: Secondary | ICD-10-CM | POA: Diagnosis not present

## 2021-05-17 DIAGNOSIS — Z91040 Latex allergy status: Secondary | ICD-10-CM | POA: Diagnosis not present

## 2021-05-17 LAB — CBC WITH DIFFERENTIAL/PLATELET
Abs Immature Granulocytes: 0.08 10*3/uL — ABNORMAL HIGH (ref 0.00–0.07)
Basophils Absolute: 0.1 10*3/uL (ref 0.0–0.1)
Basophils Relative: 1 %
Eosinophils Absolute: 0.2 10*3/uL (ref 0.0–0.5)
Eosinophils Relative: 2 %
HCT: 36.3 % (ref 36.0–46.0)
Hemoglobin: 12.8 g/dL (ref 12.0–15.0)
Immature Granulocytes: 1 %
Lymphocytes Relative: 13 %
Lymphs Abs: 1.5 10*3/uL (ref 0.7–4.0)
MCH: 31.6 pg (ref 26.0–34.0)
MCHC: 35.3 g/dL (ref 30.0–36.0)
MCV: 89.6 fL (ref 80.0–100.0)
Monocytes Absolute: 0.8 10*3/uL (ref 0.1–1.0)
Monocytes Relative: 7 %
Neutro Abs: 8.5 10*3/uL — ABNORMAL HIGH (ref 1.7–7.7)
Neutrophils Relative %: 76 %
Platelets: 214 10*3/uL (ref 150–400)
RBC: 4.05 MIL/uL (ref 3.87–5.11)
RDW: 12.7 % (ref 11.5–15.5)
WBC: 11.1 10*3/uL — ABNORMAL HIGH (ref 4.0–10.5)
nRBC: 0 % (ref 0.0–0.2)

## 2021-05-17 LAB — BASIC METABOLIC PANEL
Anion gap: 11 (ref 5–15)
BUN: 25 mg/dL — ABNORMAL HIGH (ref 8–23)
CO2: 22 mmol/L (ref 22–32)
Calcium: 9.4 mg/dL (ref 8.9–10.3)
Chloride: 103 mmol/L (ref 98–111)
Creatinine, Ser: 0.86 mg/dL (ref 0.44–1.00)
GFR, Estimated: >60 mL/min (ref >60)
Glucose, Bld: 188 mg/dL — ABNORMAL HIGH (ref 70–99)
Potassium: 4.4 mmol/L (ref 3.5–5.1)
Sodium: 136 mmol/L (ref 135–145)

## 2021-05-17 NOTE — ED Provider Notes (Signed)
[facility] Emergency Department Provider Note ____________________________________________   Event Date/Time   First MD Initiated Contact with Patient 05/17/21 1230     (approximate)  I have reviewed the triage vital signs and the nursing notes.  HISTORY  Chief Complaint Hypertension   HPI Brianna Rogers is a 75 y.o. femalewho presents to the ED for evaluation of HTN and presyncope.   Chart review indicates hx HTN, CKD, and DM.  Amlodipine 5 and quinapril 20.  Patient reports her amlodipine was increased from [id number] due to systolics in the Q000111Q a couple weeks ago by her PCP NP.  She reports developing a "swimmy headed" sensation over the past 1 week that she was concerned was due to this increased antihypertensive regimen.  She does report having systolics in the Q000111Q coinciding with this change.  She therefore dropped her amlodipine back down to 5 over the past 1 week.  Despite this, she has had intermittent dizziness and swimmy headed sensations.   She reports checking her blood pressure this morning, for the first time in about 1 week, and noted systolics in the [id number] and so she went to a local urgent care to get evaluated.  They noted similar hypertension and directed her to the ED.  She denies any falls, syncopal episodes, fevers, gait changes.  She reports that she feels fine now and is asymptomatic.  Denies ever having had chest pain, shortness of breath, increased lower extremity swelling.  Past Medical History:  Diagnosis Date   Breast cancer ([place])    Breast cancer, right ([place]) 11/18/2017   Chronic kidney disease    Complication of anesthesia    Diabetes mellitus without complication ([place])    Heart murmur    Hypertension    Lichen sclerosus et atrophicus    Personal history of radiation therapy    PONV (postoperative nausea and vomiting)     Patient Active Problem List   Diagnosis Date Noted   History of breast cancer 05/09/2021   Long  term (current) use of aromatase inhibitors 05/09/2021   Hypercalcemia 03/20/2018   Osteopenia of neck of left femur 12/20/2017   Breast cancer, right ([place]) 11/18/2017    Past Surgical History:  Procedure Laterality Date   ABDOMINAL HYSTERECTOMY     APPENDECTOMY     BREAST BIOPSY Right 11/18/2017   INVASIVE MAMMARY CARCINOMA.    BREAST LUMPECTOMY Right 12/06/2017   invasive mammary carcinoma DCIS LCIS  with RAD   CARPAL TUNNEL RELEASE Right 08/13/2020   Procedure: CARPAL TUNNEL RELEASE ENDOSCOPIC WITH RELEASE OF RIGHT LONG TRIGGER FINGER;  Surgeon: Corky Mull, MD;  Location: [facility] ORS;  Service: Orthopedics;  Laterality: Right;   COLONOSCOPY     COLONOSCOPY WITH PROPOFOL N/A 11/15/2017   Procedure: COLONOSCOPY WITH PROPOFOL;  Surgeon: Lollie Sails, MD;  Location: [facility] ENDOSCOPY;  Service: Endoscopy;  Laterality: N/A;   PARTIAL MASTECTOMY WITH NEEDLE LOCALIZATION Right 12/06/2017   Procedure: PARTIAL MASTECTOMY WITH NEEDLE LOCALIZATION;  Surgeon: Herbert Pun, MD;  Location: [facility] ORS;  Service: General;  Laterality: Right;   SENTINEL NODE BIOPSY Right 12/06/2017   Procedure: SENTINEL NODE BIOPSY;  Surgeon: Herbert Pun, MD;  Location: [facility] ORS;  Service: General;  Laterality: Right;   SEPTOPLASTY     TRIGGER FINGER RELEASE Right 12/03/2020   Procedure: RELEASE TRIGGER FINGER/A-1 PULLEY;  Surgeon: Corky Mull, MD;  Location: [facility] ORS;  Service: Orthopedics;  Laterality: Right;   TRIGGER FINGER RELEASE Left 02/11/2021   Procedure:  RELEASE OF LEFT INDEX AND RING TRIGGER FINGERS AND ENDOSCOPIC LEFT CARPAL TUNNEL RELEASE;  Surgeon: Corky Mull, MD;  Location: [facility] ORS;  Service: Orthopedics;  Laterality: Left;   TUBAL LIGATION      Prior to Admission medications   Medication Sig Start Date End Date Taking? Authorizing Provider  acetaminophen (TYLENOL) 500 MG tablet Take 500 mg by mouth every 6 (six) hours as needed for moderate pain.    [provider]   amLODipine (NORVASC) 5 MG tablet Take 10 mg by mouth every morning. 10/01/20   [provider]  aspirin EC 81 MG tablet Take 81 mg by mouth every evening.     [provider]  B-D ULTRAFINE III SHORT PEN 31G X 8 MM MISC U UTD 03/20/19   [provider]  Continuous Blood Gluc Sensor ([place]) MISC Use 3 each every 10 (ten) days 02/22/18   [provider]  denosumab (PROLIA) 60 MG/ML SOSY injection Inject 60 mg into the skin every 6 (six) months.    [provider]  imiquimod (ALDARA) 5 % cream Apply 1 application topically daily as needed (warts).    [provider]  insulin lispro (HUMALOG) 100 UNIT/ML injection Inject 14 Units into the skin 2 (two) times daily before a meal.    [provider]  LANTUS SOLOSTAR 100 UNIT/ML Solostar Pen Inject 40 Units into the skin every morning. 10/22/18   [provider]  letrozole ([place]) 2.5 MG tablet TAKE 1 TABLET BY MOUTH EVERY DAY Patient taking differently: Take 2.5 mg by mouth every evening. 12/26/20   Lequita Asal, MD  meloxicam (MOBIC) 15 MG tablet Take 15 mg by mouth daily.    [provider]  metFORMIN (GLUCOPHAGE) 1000 MG tablet Take 1,000 mg by mouth 2 (two) times daily with a meal.    [provider]  Multiple Vitamin (MULTIVITAMIN) tablet Take 1 tablet by mouth daily.    [provider]  neomycin-bacitracin-polymyxin (NEOSPORIN) ointment Apply 1 application topically as needed for wound care.    [provider]  nystatin (MYCOSTATIN/NYSTOP) powder Apply 1 application topically daily as needed (yeast).    [provider]  Omega-3 Fatty Acids (FISH OIL) 1200 MG CAPS Take 2,400 mg by mouth 2 (two) times daily.     [provider]  PREVIDENT 5000 DRY MOUTH 1.1 % GEL dental gel Place 1 application onto teeth at bedtime. 11/26/20   [provider]  PREVIDENT 5000 SENSITIVE 1.1-5 % GEL Place onto  teeth. 05/01/21   [provider]  quinapril (ACCUPRIL) 20 MG tablet Take 20 mg by mouth 2 (two) times daily.    [provider]  rosuvastatin (CRESTOR) 5 MG tablet Take 5 mg by mouth every other day. In the evening    [provider]    Allergies Alendronate, Bactrim [sulfamethoxazole-trimethoprim], Empagliflozin, Macrodantin [nitrofurantoin macrocrystal], Sulfa antibiotics, and Latex  Family History  Problem Relation Age of Onset   Breast cancer Mother 91   Cancer Mother    Cancer Maternal Aunt    Cancer Maternal Grandmother     Social History Social History   Tobacco Use   Smoking status: Never   Smokeless tobacco: Never  Vaping Use   Vaping Use: Never used  Substance Use Topics   Alcohol use: Yes    Comment: RAREly   Drug use: No    Review of Systems  Constitutional: No fever/chills..  Positive for dizziness and vague swimmy  headed sensations. Eyes: No visual changes. ENT: No sore throat. Cardiovascular: Denies chest pain. Respiratory: Denies shortness of breath. Gastrointestinal: No abdominal pain.  No nausea, no vomiting.  No diarrhea.  No constipation. Genitourinary: Negative for dysuria. Musculoskeletal: Negative for back pain. Skin: Negative for rash. Neurological: Negative for headaches, focal weakness or numbness.  ____________________________________________   PHYSICAL EXAM:  VITAL SIGNS: Vitals:   05/17/21 1300 05/17/21 1330  BP: (!) 168/79 (!) 156/65  Pulse: 85 77  Resp: (!) 21 (!) 21  Temp:    SpO2: 98% 98%     Constitutional: Alert and oriented. Well appearing and in no acute distress. Eyes: Conjunctivae are normal. PERRL. EOMI. Head: Atraumatic. Nose: No congestion/rhinnorhea. Mouth/Throat: Mucous membranes are moist.  Oropharynx non-erythematous. Neck: No stridor. No cervical spine tenderness to palpation. Cardiovascular: Normal rate, regular rhythm. Grossly normal heart sounds.  Good peripheral  circulation. Respiratory: Normal respiratory effort.  No retractions. Lungs CTAB. Gastrointestinal: Soft , nondistended, nontender to palpation. No CVA tenderness. Musculoskeletal: No lower extremity tenderness nor edema.  No joint effusions. No signs of acute trauma. Neurologic:  Normal speech and language. No gross focal neurologic deficits are appreciated. No gait instability noted. Cranial nerves II through XII intact 5/5 strength and sensation in all 4 extremities Skin:  Skin is warm, dry and intact. No rash noted. Psychiatric: Mood and affect are normal. Speech and behavior are normal.  ____________________________________________   LABS (all labs ordered are listed, but only abnormal results are displayed)  Labs Reviewed  CBC WITH DIFFERENTIAL/PLATELET - Abnormal; Notable for the following components:      Result Value   WBC 11.1 (*)    Neutro Abs 8.5 (*)    Abs Immature Granulocytes 0.08 (*)    All other components within normal limits  BASIC METABOLIC PANEL - Abnormal; Notable for the following components:   Glucose, Bld 188 (*)    BUN 25 (*)    All other components within normal limits   ____________________________________________  12 Lead EKG  Sinus rhythm the rate of 85 bpm.  Normal axis and intervals.  No evidence of acute ischemia.  Similar to EKG from 07/2020. ____________________________________________  RADIOLOGY  ED MD interpretation:  CT head reviewed by me without evidence of acute intracranial pathology  Official radiology report(s): CT HEAD WO CONTRAST (5MM)  Result Date: 05/17/2021 CLINICAL DATA:  Headache.  Evaluate for intracranial hemorrhage. EXAM: CT HEAD WITHOUT CONTRAST TECHNIQUE: Contiguous axial images were obtained from the base of the skull through the vertex without intravenous contrast. COMPARISON:  None. FINDINGS: Brain: No evidence of acute infarction, hemorrhage, hydrocephalus, extra-axial collection or mass lesion/mass effect. Vascular:  Calcified atherosclerosis in the intracranial carotids. Skull: Normal. Negative for fracture or focal lesion. Sinuses/Orbits: No acute finding. Other: None. IMPRESSION: No acute intracranial abnormalities. Electronically Signed   By: Dorise Bullion III M.D.   On: 05/17/2021 15:00    ____________________________________________   PROCEDURES and INTERVENTIONS  Procedure(s) performed (including Critical Care):  .1-3 Lead EKG Interpretation  Date/Time: 05/17/2021 3:30 PM Performed by: Vladimir Crofts, MD Authorized by: Vladimir Crofts, MD     Interpretation: normal     ECG rate:  70   ECG rate assessment: normal     Rhythm: sinus rhythm     Ectopy: none     Conduction: normal    Medications - No data to display  ____________________________________________   MDM / ED COURSE   75 year old woman presents to the ED with asymptomatic hypertension, after resolving vague swimmy  headed sensations, without evidence of acute pathology, and amenable to outpatient management.  Decreasing hypertension without intervention and otherwise normal vitals on room air.  Work-up is benign without evidence of endorgan damage such as ACS, ICH or other acute derangements.  She looks well and is asymptomatic here in the ED.  We will discharge with return precautions and recommendations to continue her antihypertensive regimen and see her PCP.     ____________________________________________   FINAL CLINICAL IMPRESSION(S) / ED DIAGNOSES  Final diagnoses:  Primary hypertension     ED Discharge Orders     None        Ryaan Vanwagoner   Note:  This document was prepared using Dragon voice recognition software and may include unintentional dictation errors.    Vladimir Crofts, MD 05/17/21 [id number]

## 2021-05-17 NOTE — ED Triage Notes (Signed)
Pt c/o elevated blood pressure this morning, 213/150. Pt reports she did call EMS and they came and evaluated her. They recorded her BP 208/98 and HR 97. Pt states they suggested she be evaluated in the ED. Pt chose to come to UC. Pt takes Quinapril '20mg'$  BID and Amlodipine '10mg'$ . Pt states she had recently had the Amlodipine increased to 10, she felt this was too much and reduced her dose back to '5mg'$ . Pt has not contacted her PCP.

## 2021-05-17 NOTE — ED Notes (Signed)
Patient is being discharged from the Urgent Care and sent to the [facility] Emergency Department via EMS . Per Barkley Boards, NP, patient is in need of higher level of care due to elevated blood pressure. Patient is aware and verbalizes understanding of plan of care.  Vitals:   05/17/21 1113  BP: (!) 193/100  Pulse: 90  Resp: 18  Temp: 98.4 F (36.9 C)  SpO2: 100%

## 2021-05-17 NOTE — ED Notes (Signed)
Patient is being discharged from the Urgent Care and sent to the Emergency Department via POV, driven by her cousin . Per Barkley Boards, NP, patient is in need of higher level of care due to elevated BP. Patient is aware and verbalizes understanding of plan of care.  Vitals:   05/17/21 1113  BP: (!) 193/100  Pulse: 90  Resp: 18  Temp: 98.4 F (36.9 C)  SpO2: 100%

## 2021-05-17 NOTE — Discharge Instructions (Addendum)
Keep taking your blood pressure medications as prescribed, including the 10 mg of amlodipine.  Return to the ED with any severe headaches, passing out or fevers with your symptoms.

## 2021-05-17 NOTE — ED Provider Notes (Signed)
[facility]    CSN: [id number] Arrival date & time: 05/17/21  1058      History   Chief Complaint Chief Complaint  Patient presents with   Hypertension    HPI Brianna Rogers is a 75 y.o. female.  Patient presents with lightheadedness since this morning.  She took her blood pressure at home and it was 213/150.  She called EMS and states her blood pressure was 208/98 with EMS.  She was not transported but was instructed to go to the ED but came here instead.  She denies room spinning dizziness but reports the sensation of lightheadedness feels like being off balance.  She denies chest pain, shortness of breath, weakness, numbness, lower extremity edema, or other symptoms.  Patient reports her PCP recently increased her dose of amlodipine but she decreased it back down 5 days ago without notifying her PCP.  Her medical history includes hypertension, diabetes, CKD, breast cancer.  The history is provided by the patient and medical records.   Past Medical History:  Diagnosis Date   Breast cancer ([place])    Breast cancer, right ([place]) 11/18/2017   Chronic kidney disease    Complication of anesthesia    Diabetes mellitus without complication ([place])    Heart murmur    Hypertension    Lichen sclerosus et atrophicus    Personal history of radiation therapy    PONV (postoperative nausea and vomiting)     Patient Active Problem List   Diagnosis Date Noted   History of breast cancer 05/09/2021   Long term (current) use of aromatase inhibitors 05/09/2021   Hypercalcemia 03/20/2018   Osteopenia of neck of left femur 12/20/2017   Breast cancer, right ([place]) 11/18/2017    Past Surgical History:  Procedure Laterality Date   ABDOMINAL HYSTERECTOMY     APPENDECTOMY     BREAST BIOPSY Right 11/18/2017   INVASIVE MAMMARY CARCINOMA.    BREAST LUMPECTOMY Right 12/06/2017   invasive mammary carcinoma DCIS LCIS  with RAD   CARPAL TUNNEL RELEASE Right 08/13/2020   Procedure: CARPAL  TUNNEL RELEASE ENDOSCOPIC WITH RELEASE OF RIGHT LONG TRIGGER FINGER;  Surgeon: Corky Mull, MD;  Location: [facility] ORS;  Service: Orthopedics;  Laterality: Right;   COLONOSCOPY     COLONOSCOPY WITH PROPOFOL N/A 11/15/2017   Procedure: COLONOSCOPY WITH PROPOFOL;  Surgeon: Lollie Sails, MD;  Location: [facility] ENDOSCOPY;  Service: Endoscopy;  Laterality: N/A;   PARTIAL MASTECTOMY WITH NEEDLE LOCALIZATION Right 12/06/2017   Procedure: PARTIAL MASTECTOMY WITH NEEDLE LOCALIZATION;  Surgeon: Herbert Pun, MD;  Location: [facility] ORS;  Service: General;  Laterality: Right;   SENTINEL NODE BIOPSY Right 12/06/2017   Procedure: SENTINEL NODE BIOPSY;  Surgeon: Herbert Pun, MD;  Location: [facility] ORS;  Service: General;  Laterality: Right;   SEPTOPLASTY     TRIGGER FINGER RELEASE Right 12/03/2020   Procedure: RELEASE TRIGGER FINGER/A-1 PULLEY;  Surgeon: Corky Mull, MD;  Location: [facility] ORS;  Service: Orthopedics;  Laterality: Right;   TRIGGER FINGER RELEASE Left 02/11/2021   Procedure: RELEASE OF LEFT INDEX AND RING TRIGGER FINGERS AND ENDOSCOPIC LEFT CARPAL TUNNEL RELEASE;  Surgeon: Corky Mull, MD;  Location: [facility] ORS;  Service: Orthopedics;  Laterality: Left;   TUBAL LIGATION      OB History   No obstetric history on file.      Home Medications    Prior to Admission medications   Medication Sig Start Date End Date Taking? Authorizing Provider  acetaminophen (TYLENOL) 500 MG  tablet Take 500 mg by mouth every 6 (six) hours as needed for moderate pain.   Yes [provider]  amLODipine (NORVASC) 5 MG tablet Take 10 mg by mouth every morning. 10/01/20  Yes [provider]  aspirin EC 81 MG tablet Take 81 mg by mouth every evening.    Yes [provider]  B-D ULTRAFINE III SHORT PEN 31G X 8 MM MISC U UTD 03/20/19  Yes [provider]  Continuous Blood Gluc Sensor ([place]) MISC Use 3 each every 10 (ten) days 02/22/18  Yes [provider]  denosumab (PROLIA) 60 MG/ML SOSY injection Inject 60 mg into the skin every 6 (six) months.   Yes [provider]  imiquimod (ALDARA) 5 % cream Apply 1 application topically daily as needed ([place]).   Yes [provider]  insulin lispro (HUMALOG) 100 UNIT/ML injection Inject 14 Units into the skin 2 (two) times daily before a meal.   Yes [provider]  LANTUS SOLOSTAR 100 UNIT/ML Solostar Pen Inject 40 Units into the skin every morning. 10/22/18  Yes [provider]  letrozole ([place]) 2.5 MG tablet TAKE 1 TABLET BY MOUTH EVERY DAY Patient taking differently: Take 2.5 mg by mouth every evening. 12/26/20  Yes Corcoran, Drue Second, MD  meloxicam (MOBIC) 15 MG tablet Take 15 mg by mouth daily.   Yes [provider]  metFORMIN (GLUCOPHAGE) 1000 MG tablet Take 1,000 mg by mouth 2 (two) times daily with a meal.   Yes [provider]  Multiple Vitamin (MULTIVITAMIN) tablet Take 1 tablet by mouth daily.   Yes [provider]  neomycin-bacitracin-polymyxin (NEOSPORIN) ointment Apply 1 application topically as needed for wound care.   Yes [provider]  nystatin (MYCOSTATIN/NYSTOP) powder Apply 1 application topically daily as needed (yeast).   Yes [provider]  Omega-3 Fatty Acids (FISH OIL) 1200 MG CAPS Take 2,400 mg by mouth 2 (two) times daily.    Yes [provider]  PREVIDENT 5000 DRY MOUTH 1.1 % GEL dental gel Place 1 application onto teeth at bedtime. 11/26/20  Yes [provider]  PREVIDENT 5000 SENSITIVE 1.1-5 % GEL Place onto teeth. 05/01/21  Yes [provider]  quinapril (ACCUPRIL) 20 MG tablet Take 20 mg by mouth 2 (two) times daily.   Yes [provider]  rosuvastatin (CRESTOR) 5 MG tablet Take 5 mg by mouth every other day. In the evening   Yes [provider]    Family History Family History  Problem Relation Age of Onset   Breast cancer Mother 10    Cancer Mother    Cancer Maternal Aunt    Cancer Maternal Grandmother     Social History Social History   Tobacco Use   Smoking status: Never   Smokeless tobacco: Never  Vaping Use   Vaping Use: Never used  Substance Use Topics   Alcohol use: Yes    Comment: RAREly   Drug use: No     Allergies   Alendronate, Bactrim [sulfamethoxazole-trimethoprim], Empagliflozin, Macrodantin [nitrofurantoin macrocrystal], Sulfa antibiotics, and Latex   Review of Systems Review of Systems  Constitutional:  Negative for chills and fever.  Respiratory:  Negative for cough and shortness of breath.   Cardiovascular:  Negative for chest pain and palpitations.  Gastrointestinal:  Negative for abdominal pain and vomiting.  Skin:  Negative for color change and rash.  Neurological:  Positive for light-headedness. Negative for dizziness, seizures, syncope, facial asymmetry, speech difficulty,  weakness and numbness.  All other systems reviewed and are negative.   Physical Exam Triage Vital Signs ED Triage Vitals  Enc Vitals Group     BP 05/17/21 1113 (!) 193/100     Pulse Rate 05/17/21 1113 90     Resp 05/17/21 1113 18     Temp 05/17/21 1113 98.4 F (36.9 C)     Temp Source 05/17/21 1113 Oral     SpO2 05/17/21 1113 100 %     Weight 05/17/21 1110 162 lb (73.5 kg)     Height 05/17/21 1110 4' 11.75" (1.518 m)     Head Circumference --      Peak Flow --      Pain Score 05/17/21 1110 0     Pain Loc --      Pain Edu? --      Excl. in [place]? --    No data found.  Updated Vital Signs BP (!) 193/100 (BP Location: Left Arm)   Pulse 90   Temp 98.4 F (36.9 C) (Oral)   Resp 18   Ht 4' 11.75" (1.518 m)   Wt 162 lb (73.5 kg)   SpO2 100%   BMI 31.90 kg/m   Visual Acuity Right Eye Distance:   Left Eye Distance:   Bilateral Distance:    Right Eye Near:   Left Eye Near:    Bilateral Near:     Physical Exam Vitals and nursing note reviewed.  Constitutional:      General: She is not in  acute distress.    Appearance: She is well-developed. She is not ill-appearing.  HENT:     Head: Normocephalic and atraumatic.     Mouth/Throat:     Mouth: Mucous membranes are moist.  Eyes:     Conjunctiva/sclera: Conjunctivae normal.  Cardiovascular:     Rate and Rhythm: Normal rate and regular rhythm.     Heart sounds: Normal heart sounds.  Pulmonary:     Effort: Pulmonary effort is normal. No respiratory distress.     Breath sounds: Normal breath sounds.  Abdominal:     Palpations: Abdomen is soft.     Tenderness: There is no abdominal tenderness.  Musculoskeletal:     Cervical back: Neck supple.  Skin:    General: Skin is warm and dry.  Neurological:     General: No focal deficit present.     Mental Status: She is alert and oriented to person, place, and time.     Sensory: No sensory deficit.     Motor: No weakness.     Gait: Gait normal.  Psychiatric:        Mood and Affect: Mood normal.        Behavior: Behavior normal.     UC Treatments / Results  Labs (all labs ordered are listed, but only abnormal results are displayed) Labs Reviewed - No data to display  EKG   Radiology No results found.  Procedures Procedures (including critical care time)  Medications Ordered in UC Medications - No data to display  Initial Impression / Assessment and Plan / UC Course  I have reviewed the triage vital signs and the nursing notes.  Pertinent labs & imaging results that were available during my care of the patient were reviewed by me and considered in my medical decision making (see chart for details).  Hypertensive urgency, lightheadedness, abnormal EKG.  EKG shows sinus rhythm, rate 92, no ST elevation, inverted T wave in V2 which is new from  previous in November 2021.  Based on the patient's current lightheadedness, EKG change, and blood pressure readings at home and here, I feel that she needs a higher level of care.  Sending her to the ED for evaluation.  Patient  prefers to be transferred to the ED via EMS.   Final Clinical Impressions(s) / UC Diagnoses   Final diagnoses:  Hypertensive urgency  Lightheadedness  Abnormal EKG     Discharge Instructions              ED Prescriptions   None    PDMP not reviewed this encounter.   Sharion Balloon, NP 05/17/21 1144

## 2021-05-17 NOTE — ED Triage Notes (Signed)
Pt presents to the ED via EMS from [facility] with c/o hypertension. Pt reports hx of hypertension and currently taking Quinapril. Pt's PCP placed pt on Amlodipine '5mg'$  last week then increased to '10mg'$ . Pt states that she began having episodes of lightheadedness and decreased dose back to '5mg'$ .

## 2021-06-12 ENCOUNTER — Ambulatory Visit: Payer: Medicare Other | Admitting: Radiation Oncology

## 2021-06-20 DIAGNOSIS — G4733 Obstructive sleep apnea (adult) (pediatric): Secondary | ICD-10-CM | POA: Insufficient documentation

## 2021-06-23 ENCOUNTER — Other Ambulatory Visit: Payer: Self-pay | Admitting: *Deleted

## 2021-06-23 MED ORDER — LETROZOLE 2.5 MG PO TABS: 2.5000 mg | ORAL_TABLET | Freq: Every day | ORAL | 1 refills | Status: DC

## 2021-07-03 ENCOUNTER — Ambulatory Visit
Admission: RE | Admit: 2021-07-03 | Discharge: 2021-07-03 | Disposition: A | Discharge status: Home / Self Care | Payer: Medicare Other | Source: Ambulatory Visit | Attending: Radiation Oncology | Admitting: Radiation Oncology

## 2021-07-03 ENCOUNTER — Other Ambulatory Visit: Payer: Self-pay

## 2021-07-03 ENCOUNTER — Encounter: Payer: Self-pay | Admitting: Radiation Oncology

## 2021-07-03 DIAGNOSIS — Z170 Estrogen receptor positive status [ER+]: Secondary | ICD-10-CM

## 2021-07-03 DIAGNOSIS — C50411 Malignant neoplasm of upper-outer quadrant of right female breast: Secondary | ICD-10-CM

## 2021-07-03 NOTE — Progress Notes (Signed)
Radiation Oncology Follow up Note  Name: Brianna Rogers   Date:   07/03/2021 MRN:  [id number] DOB: 1946-02-01    This 75 y.o. female presents to the clinic today for 3-year follow-up status post whole breast radiation to her right breast for stage I ER/PR positive invasive mammary carcinoma.  REFERRING PROVIDER: Sallee Lange, *  HPI: Patient is a 75 year old female now out 3 years having completed whole breast radiation to her right breast for stage I ER/PR positive invasive mammary carcinoma.  Seen today in routine follow-up she is doing well.  She specifically denies breast tenderness cough or bone pain..  She had mammograms back in February which I have reviewed were BI-RADS 2 benign.  She also had a CT of scan of her brain back in August showing no acute intracranial abnormalities.  She was having headaches.  She is currently on letrozole tolerating that well without side effect.  COMPLICATIONS OF TREATMENT: none  FOLLOW UP COMPLIANCE: keeps appointments   PHYSICAL EXAM:  BP (!) (P) 156/78 (BP Location: Left Arm, Patient Position: Sitting)   Pulse (P) 85   Temp (P) 97.9 F (36.6 C) (Tympanic)   Resp (P) 16   Wt (P) 160 lb (72.6 kg)   BMI (P) 32.32 kg/m  Lungs are clear to A&P cardiac examination essentially unremarkable with regular rate and rhythm. No dominant mass or nodularity is noted in either breast in 2 positions examined. Incision is well-healed. No axillary or supraclavicular adenopathy is appreciated. Cosmetic result is excellent.  Well-developed well-nourished patient in NAD. HEENT reveals PERLA, EOMI, discs not visualized.  Oral cavity is clear. No oral mucosal lesions are identified. Neck is clear without evidence of cervical or supraclavicular adenopathy. Lungs are clear to A&P. Cardiac examination is essentially unremarkable with regular rate and rhythm without murmur rub or thrill. Abdomen is benign with no organomegaly or masses noted. Motor sensory and DTR  levels are equal and symmetric in the upper and lower extremities. Cranial nerves II through XII are grossly intact. Proprioception is intact. No peripheral adenopathy or edema is identified. No motor or sensory levels are noted. Crude visual fields are within normal range.  RADIOLOGY RESULTS: Mammogram and head CT reviewed compatible with above-stated findings  PLAN: Present time patient is now 3 years with no evidence of disease.  And pleased with her overall progress.  I have asked to see her back in 1 year for follow-up.  Patient knows to call with any concerns.   I would like to take this opportunity to thank you for allowing me to participate in the care of your patient.Noreene Filbert, MD

## 2021-11-03 ENCOUNTER — Ambulatory Visit
Admission: RE | Admit: 2021-11-03 | Discharge: 2021-11-03 | Disposition: A | Discharge status: Home / Self Care | Payer: Medicare Other | Source: Ambulatory Visit | Attending: Nurse Practitioner | Admitting: Nurse Practitioner

## 2021-11-03 ENCOUNTER — Other Ambulatory Visit: Payer: Self-pay

## 2021-11-03 ENCOUNTER — Ambulatory Visit: Payer: Medicare Other

## 2021-11-03 DIAGNOSIS — Z1231 Encounter for screening mammogram for malignant neoplasm of breast: Secondary | ICD-10-CM | POA: Insufficient documentation

## 2021-11-03 DIAGNOSIS — Z853 Personal history of malignant neoplasm of breast: Secondary | ICD-10-CM | POA: Insufficient documentation

## 2021-11-03 DIAGNOSIS — Z08 Encounter for follow-up examination after completed treatment for malignant neoplasm: Secondary | ICD-10-CM | POA: Diagnosis present

## 2021-11-11 ENCOUNTER — Encounter: Payer: Self-pay | Admitting: Internal Medicine

## 2021-11-11 ENCOUNTER — Inpatient Hospital Stay: Payer: Medicare Other | Attending: Internal Medicine

## 2021-11-11 ENCOUNTER — Other Ambulatory Visit: Payer: Self-pay

## 2021-11-11 ENCOUNTER — Inpatient Hospital Stay: Payer: Medicare Other

## 2021-11-11 ENCOUNTER — Inpatient Hospital Stay (HOSPITAL_BASED_OUTPATIENT_CLINIC_OR_DEPARTMENT_OTHER): Payer: Medicare Other | Admitting: Internal Medicine

## 2021-11-11 DIAGNOSIS — Z79811 Long term (current) use of aromatase inhibitors: Secondary | ICD-10-CM | POA: Diagnosis not present

## 2021-11-11 DIAGNOSIS — E782 Mixed hyperlipidemia: Secondary | ICD-10-CM | POA: Insufficient documentation

## 2021-11-11 DIAGNOSIS — M8580 Other specified disorders of bone density and structure, unspecified site: Secondary | ICD-10-CM | POA: Insufficient documentation

## 2021-11-11 DIAGNOSIS — M85852 Other specified disorders of bone density and structure, left thigh: Secondary | ICD-10-CM

## 2021-11-11 DIAGNOSIS — C50811 Malignant neoplasm of overlapping sites of right female breast: Secondary | ICD-10-CM

## 2021-11-11 DIAGNOSIS — Z170 Estrogen receptor positive status [ER+]: Secondary | ICD-10-CM | POA: Diagnosis not present

## 2021-11-11 DIAGNOSIS — E1121 Type 2 diabetes mellitus with diabetic nephropathy: Secondary | ICD-10-CM | POA: Insufficient documentation

## 2021-11-11 DIAGNOSIS — Z08 Encounter for follow-up examination after completed treatment for malignant neoplasm: Secondary | ICD-10-CM

## 2021-11-11 LAB — COMPREHENSIVE METABOLIC PANEL
ALT: 18 U/L (ref 0–44)
AST: 19 U/L (ref 15–41)
Albumin: 3.9 g/dL (ref 3.5–5.0)
Alkaline Phosphatase: 45 U/L (ref 38–126)
Anion gap: 11 (ref 5–15)
BUN: 28 mg/dL — ABNORMAL HIGH (ref 8–23)
CO2: 23 mmol/L (ref 22–32)
Calcium: 9.9 mg/dL (ref 8.9–10.3)
Chloride: 102 mmol/L (ref 98–111)
Creatinine, Ser: 1.08 mg/dL — ABNORMAL HIGH (ref 0.44–1.00)
GFR, Estimated: 54 mL/min — ABNORMAL LOW (ref >60)
Glucose, Bld: 197 mg/dL — ABNORMAL HIGH (ref 70–99)
Potassium: 4.1 mmol/L (ref 3.5–5.1)
Sodium: 136 mmol/L (ref 135–145)
Total Bilirubin: 0.6 mg/dL (ref 0.3–1.2)
Total Protein: 7.2 g/dL (ref 6.5–8.1)

## 2021-11-11 LAB — CBC WITH DIFFERENTIAL/PLATELET
Abs Immature Granulocytes: 0.06 10*3/uL (ref 0.00–0.07)
Basophils Absolute: 0.1 10*3/uL (ref 0.0–0.1)
Basophils Relative: 1 %
Eosinophils Absolute: 0.6 10*3/uL — ABNORMAL HIGH (ref 0.0–0.5)
Eosinophils Relative: 6 %
HCT: 37 % (ref 36.0–46.0)
Hemoglobin: 12.7 g/dL (ref 12.0–15.0)
Immature Granulocytes: 1 %
Lymphocytes Relative: 18 %
Lymphs Abs: 1.9 10*3/uL (ref 0.7–4.0)
MCH: 30.2 pg (ref 26.0–34.0)
MCHC: 34.3 g/dL (ref 30.0–36.0)
MCV: 87.9 fL (ref 80.0–100.0)
Monocytes Absolute: 0.8 10*3/uL (ref 0.1–1.0)
Monocytes Relative: 8 %
Neutro Abs: 7.1 10*3/uL (ref 1.7–7.7)
Neutrophils Relative %: 66 %
Platelets: 201 10*3/uL (ref 150–400)
RBC: 4.21 MIL/uL (ref 3.87–5.11)
RDW: 13.2 % (ref 11.5–15.5)
WBC: 10.5 10*3/uL (ref 4.0–10.5)
nRBC: 0 % (ref 0.0–0.2)

## 2021-11-11 MED ORDER — DENOSUMAB 60 MG/ML ~~LOC~~ SOSY
60.0000 mg | PREFILLED_SYRINGE | Freq: Once | SUBCUTANEOUS | Status: AC
  Administered 2021-11-11: 60 mg via SUBCUTANEOUS
  Filled 2021-11-11: qty 1

## 2021-11-11 NOTE — Assessment & Plan Note (Addendum)
1.Stage IA ER/PR positive, HER2/neu negative right breast cancer - s/p lumpectomy on 12/06/17. Pathology revealed 1.7 cm grade III invasive mammary carcinoma of now special type. 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 She underwent re-excision of margin. Persistent positive margin on re-excision with in situ carcinoma < 0.5 mm from margin. There was lymphovascular invasion. One SLN was negative.  No chemotherapy based on oncotype. She received radiation completed 03/03/18.  Stable.  # Started AI therapy 03/15/18. Mammogram from 10/31/21 was reviewed today, no radiographic evidence of disease. Clinically asymptomatic.  Breast exam deferred today as patient had exam with Dr. Peyton Najjar this morning.  # Continue femara daily given er/pr positivity of her cancer for at least 5 years [until summer June, 2024].  Reviewed the Oncotype-low risk.  Continue 6 month surveillance.   2. Osteopenia- osteopenia present on dexa scan prior to cancer diagnosis or AI treatment. She receives prolia every 6 months. Last bone density scan was 12/18/2019. She continues weight bearing exercise. Labs reviewed and ok for prolia today. vit D levels; will BMD with next mammo in Feb 2024.   #Slightly elevated creatinine-GFR 54; intermittent-question recent steroids elevated blood sugars [DM]-monitor closely with PCP.  Disposition: # Prolia today # 6 months (labs- cbc, cmp, ca27.29; 25-OH vit D leves), MD- prolia-Dr.B

## 2021-11-11 NOTE — Progress Notes (Signed)
Pt in for follow up, former pt of Dr Mike Gip.  Pt saw Beckey Rutter NP 6 months ago and saw surgeon earlier today who performed breast exam.

## 2021-11-11 NOTE — Progress Notes (Signed)
[facility] OFF[facility] PROGRESS NOTE  Patient Care Team: Gauger, Victoriano Lain, NP as PCP - General (Internal Medicine) Ezequiel Kayser, MD (Inactive) as Consulting Physician (Internal Medicine) Cammie Sickle, MD as Consulting Physician (Internal Medicine)  SUMMARY OF ONCOLOGIC HISTORY: Oncology History Overview Note  1.   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 Pathology revealed 1.7 cm grade III invasive mammary carcinoma of now special type. 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 She underwent re-excision of margin. Persistent positive margin on re-excision with in situ carcinoma < 0.5 mm from margin. There was lymphovascular invasion. One SLN was negative. No chemotherapy based on oncotype. She received radiation completed 03/03/18. Started AI therapy 03/15/18.    2.   Osteopenia- osteopenia present on dexa scan prior to cancer diagnosis or AI treatment. She receives prolia every 6 months. Last bone density scan was 12/18/2019.       Carcinoma of overlapping sites of right breast in female, estrogen receptor positive ([facility])  11/11/2021 Initial Diagnosis   Carcinoma of overlapping sites of right breast in female, estrogen receptor positive ([facility])     INTERVAL HISTORY: Alone.  Ambulating independently.  76 year old female patient with a history of stage I ER/PR positive breast cancer HER2 negative currently on letrozole is here for follow-up.  Patient denies any new lumps or bumps.  Denies any chest pain or shortness of breath or cough.  Patient had a recent episode of neck pain for which she was prescribed prednisone.  She is currently off prednisone.  The neck pain is improved.   Review of Systems  Constitutional:  Negative for chills, diaphoresis, fever, malaise/fatigue and weight loss.  HENT:  Negative for nosebleeds and sore throat.   Eyes:  Negative for double vision.  Respiratory:  Negative for  cough, hemoptysis, sputum production, shortness of breath and wheezing.   Cardiovascular:  Negative for chest pain, palpitations, orthopnea and leg swelling.  Gastrointestinal:  Negative for abdominal pain, blood in stool, constipation, diarrhea, heartburn, melena, nausea and vomiting.  Genitourinary:  Negative for dysuria, frequency and urgency.  Musculoskeletal:  Positive for joint pain and neck pain. Negative for back pain.  Skin: Negative.  Negative for itching and rash.  Neurological:  Negative for dizziness, tingling, focal weakness, weakness and headaches.  Endo/Heme/Allergies:  Does not bruise/bleed easily.  Psychiatric/Behavioral:  Negative for depression. The patient is not nervous/anxious and does not have insomnia.     ALLERGIES:  is allergic to alendronate, bactrim [sulfamethoxazole-trimethoprim], empagliflozin, macrodantin [nitrofurantoin macrocrystal], sulfa antibiotics, and latex.  MEDICATIONS:  Current Outpatient Medications  Medication Sig Dispense Refill   acetaminophen (TYLENOL) 500 MG tablet Take 500 mg by mouth every 6 (six) hours as needed for moderate pain.     amLODipine (NORVASC) 5 MG tablet Take 10 mg by mouth every morning.     aspirin EC 81 MG tablet Take 81 mg by mouth every evening.      B-D ULTRAFINE III SHORT PEN 31G X 8 MM MISC U UTD     Continuous Blood Gluc Sensor (FREESTYLE LIBRE SENSOR SYSTEM) MISC Use 3 each every 10 (ten) days     denosumab (PROLIA) 60 MG/ML SOSY injection Inject 60 mg into the skin every 6 (six) months.     fluorometholone (FML) 0.1 % ophthalmic suspension Place 1 drop into the left eye 4 (four) times daily.     hydrochlorothiazide (MICROZIDE) 12.5 MG capsule Take 12.5 mg by mouth daily.  insulin lispro (HUMALOG) 100 UNIT/ML injection Inject 14 Units into the skin 2 (two) times daily before a meal.     LANTUS SOLOSTAR 100 UNIT/ML Solostar Pen Inject 40 Units into the skin every morning.     letrozole (FEMARA) 2.5 MG tablet Take 1  tablet (2.5 mg total) by mouth daily. 90 tablet 1   losartan (COZAAR) 100 MG tablet      meloxicam (MOBIC) 15 MG tablet Take 15 mg by mouth daily.     metFORMIN (GLUCOPHAGE) 1000 MG tablet Take 1,000 mg by mouth 2 (two) times daily with a meal.     methocarbamol (ROBAXIN) 500 MG tablet Take 500 mg by mouth 3 (three) times daily.     Multiple Vitamin (MULTIVITAMIN) tablet Take 1 tablet by mouth daily.     Omega-3 Fatty Acids (FISH OIL) 1200 MG CAPS Take 2,400 mg by mouth 2 (two) times daily.      PREVIDENT 5000 DRY MOUTH 1.1 % GEL dental gel Place 1 application onto teeth at bedtime.     rosuvastatin (CRESTOR) 5 MG tablet Take 5 mg by mouth every other day. In the evening     No current facility-administered medications for this visit.    PHYSICAL EXAMINATION: ECOG PERFORMANCE STATUS: 0 - Asymptomatic  Vitals:   11/11/21 1030  BP: (!) 158/85  Pulse: 79  Resp: 16  Temp: 97.7 F (36.5 C)  SpO2: 99%   Filed Weights   11/11/21 1030  Weight: 160 lb (72.6 kg)    Physical Exam Vitals and nursing note reviewed.  HENT:     Head: Normocephalic and atraumatic.     Mouth/Throat:     Pharynx: Oropharynx is clear.  Eyes:     Extraocular Movements: Extraocular movements intact.     Pupils: Pupils are equal, round, and reactive to light.  Cardiovascular:     Rate and Rhythm: Normal rate and regular rhythm.  Pulmonary:     Comments: Decreased breath sounds bilaterally.  Abdominal:     Palpations: Abdomen is soft.  Musculoskeletal:        General: Normal range of motion.     Cervical back: Normal range of motion.  Skin:    General: Skin is warm.  Neurological:     General: No focal deficit present.     Mental Status: She is alert and oriented to person, place, and time.  Psychiatric:        Behavior: Behavior normal.        Judgment: Judgment normal.    LABORATORY DATA:  I have reviewed the data as listed    Component Value Date/Time   NA 136 11/11/2021 0947   K 4.1  11/11/2021 0947   CL 102 11/11/2021 0947   CO2 23 11/11/2021 0947   GLUCOSE 197 (H) 11/11/2021 0947   BUN 28 (H) 11/11/2021 0947   CREATININE 1.08 (H) 11/11/2021 0947   CALCIUM 9.9 11/11/2021 0947   CALCIUM 10.1 03/15/2018 1151   PROT 7.2 11/11/2021 0947   ALBUMIN 3.9 11/11/2021 0947   AST 19 11/11/2021 0947   ALT 18 11/11/2021 0947   ALKPHOS 45 11/11/2021 0947   BILITOT 0.6 11/11/2021 0947   GFRNONAA 54 (L) 11/11/2021 0947   GFRAA >60 05/01/2020 1339    No results found for: SPEP, UPEP  Lab Results  Component Value Date   WBC 10.5 11/11/2021   NEUTROABS 7.1 11/11/2021   HGB 12.7 11/11/2021   HCT 37.0 11/11/2021   MCV 87.9 11/11/2021  PLT 201 11/11/2021      Chemistry      Component Value Date/Time   NA 136 11/11/2021 0947   K 4.1 11/11/2021 0947   CL 102 11/11/2021 0947   CO2 23 11/11/2021 0947   BUN 28 (H) 11/11/2021 0947   CREATININE 1.08 (H) 11/11/2021 0947      Component Value Date/Time   CALCIUM 9.9 11/11/2021 0947   CALCIUM 10.1 03/15/2018 1151   ALKPHOS 45 11/11/2021 0947   AST 19 11/11/2021 0947   ALT 18 11/11/2021 0947   BILITOT 0.6 11/11/2021 0947       Lab Results  Component Value Date   CA2729 18.2 05/09/2021   CA2729 22.9 11/04/2020   CA2729 15.7 05/01/2020   CA2729 25.6 11/29/2019   CA2729 21.7 11/02/2019   CA2729 17.4 08/01/2019     RADIOGRAPHIC STUDIES: I have personally reviewed the radiological images as listed and agreed with the findings in the report. No results found.   ASSESSMENT & PLAN:  Carcinoma of overlapping sites of right breast in female, estrogen receptor positive ([facility]) 1.   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 Pathology revealed 1.7 cm grade III invasive mammary carcinoma of now special type. 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 She underwent re-excision of margin. Persistent positive margin on re-excision with in situ carcinoma < 0.5 mm from  margin. There was lymphovascular invasion. One SLN was negative.  No chemotherapy based on oncotype. She received radiation completed 03/03/18.   # Started AI therapy 03/15/18. Mammogram from 10/31/21 was reviewed today, no radiographic evidence of disease. Clinically asymptomatic.  Breast exam deferred today as patient had exam with Dr. Peyton Najjar this morning.  # Continue femara daily given er/pr positivity of her cancer for at least 5 years [until summer June, 2024].  Reviewed the Oncotype-low risk.  Continue 6 month surveillance.    2.   Osteopenia- osteopenia present on dexa scan prior to cancer diagnosis or AI treatment. She receives prolia every 6 months. Last bone density scan was 12/18/2019. She continues weight bearing exercise. Labs reviewed and ok for prolia today. vit D levels; will BMD with next mammo in Feb 2024.   #Slightly elevated creatinine-GFR 54; intermittent-question recent steroids elevated blood sugars [DM]-monitor closely with PCP.   Disposition: # Prolia today # 6 months (labs- cbc, cmp, ca27.29; 25-OH vit D leves), MD- prolia-Dr.B     Orders Placed This Encounter  Procedures   Cancer antigen 27.29    Standing Status:   Future    Standing Expiration Date:   11/11/2022   CBC with Differential/Platelet    Standing Status:   Future    Standing Expiration Date:   11/11/2022   Comprehensive metabolic panel    Standing Status:   Future    Standing Expiration Date:   11/11/2022   VITAMIN D 25 Hydroxy (Vit-D Deficiency, Fractures)    Standing Status:   Future    Standing Expiration Date:   11/11/2022   Calcitriol (1,25 di-OH Vit D)    Standing Status:   Future    Standing Expiration Date:   11/11/2022       Cammie Sickle, MD 11/11/2021 12:08 PM

## 2021-11-12 LAB — CANCER ANTIGEN 27.29: CA 27.29: 17.5 U/mL (ref 0.0–38.6)

## 2021-11-13 ENCOUNTER — Ambulatory Visit: Payer: Medicare Other | Admitting: Oncology

## 2021-11-13 ENCOUNTER — Other Ambulatory Visit: Payer: Medicare Other

## 2021-12-04 ENCOUNTER — Ambulatory Visit (INDEPENDENT_AMBULATORY_CARE_PROVIDER_SITE_OTHER): Payer: Medicare Other

## 2021-12-04 ENCOUNTER — Ambulatory Visit
Admission: EM | Admit: 2021-12-04 | Discharge: 2021-12-04 | Disposition: A | Discharge status: Home / Self Care | Payer: Medicare Other | Attending: Emergency Medicine | Admitting: Emergency Medicine

## 2021-12-04 ENCOUNTER — Other Ambulatory Visit: Payer: Self-pay

## 2021-12-04 DIAGNOSIS — S51831A Puncture wound without foreign body of right forearm, initial encounter: Secondary | ICD-10-CM

## 2021-12-04 DIAGNOSIS — Z23 Encounter for immunization: Secondary | ICD-10-CM | POA: Diagnosis not present

## 2021-12-04 DIAGNOSIS — W5501XA Bitten by cat, initial encounter: Secondary | ICD-10-CM | POA: Diagnosis not present

## 2021-12-04 DIAGNOSIS — I10 Essential (primary) hypertension: Secondary | ICD-10-CM

## 2021-12-04 MED ORDER — AMOXICILLIN-POT CLAVULANATE 875-125 MG PO TABS: 1.0000 | ORAL_TABLET | Freq: Two times a day (BID) | ORAL | 0 refills | Status: AC

## 2021-12-04 MED ORDER — TETANUS-DIPHTH-ACELL PERTUSSIS 5-2.5-18.5 LF-MCG/0.5 IM SUSY
0.5000 mL | PREFILLED_SYRINGE | Freq: Once | INTRAMUSCULAR | Status: AC
  Administered 2021-12-04: 0.5 mL via INTRAMUSCULAR

## 2021-12-04 NOTE — Discharge Instructions (Addendum)
We are updating your tetanus today.  Finish the Augmentin, even if you feel better.  Your x-ray was negative for a retained foreign body.  May take Tylenol and meloxicam as needed for pain.   ? ?Follow-up with your doctor regarding your blood pressure. ? ?Decrease your salt intake. diet and exercise will lower your blood pressure significantly. It is important to keep your blood pressure under good control, as having a elevated blood pressure for prolonged periods of time significantly increases your risk of stroke, heart attacks, kidney damage, eye damage, and other problems. Measure your blood pressure once a day, preferably at the same time every day.  Return immediately to the ER if you start having chest pain, headache, problems seeing, problems talking, problems walking, if you feel like you're about to pass out, if you do pass out, if you have a seizure, or for any other concerns. ? ?Go to www.goodrx.com  or www.costplusdrugs.com to look up your medications. This will give you a list of where you can find your prescriptions at the most affordable prices. Or ask the pharmacist what the cash price is, or if they have any other discount programs available to help make your medication more affordable. This can be less expensive than what you would pay with insurance.   ?

## 2021-12-04 NOTE — ED Provider Notes (Signed)
HPI ? ?SUBJECTIVE: ? ?Jenaya Saar is a 76 y.o. female who presents with right forearm erythema, swelling after being bitten by her pet cat 5 days ago.  States that the erythema appeared 3 days ago and is getting bigger.  She reports very mild pain, rates it as a 1 out of 10, it is tender to palpation only..  She states that the bite is scabbing over.  She reports some forearm swelling.  No fevers, bites, foreign body sensation, purulent drainage coming from the bite.  No pain with wrist or hand movement, use, distal numbness or tingling.  No antipyretic in the past 6 hours.  She washed the wound out with water and applied Neosporin.  No alleviating factors.  Symptoms are worse with palpation.  She has a past medical history of diabetes, hypertension, took her medications today.  She denies a history of chronic kidney disease.  Her tetanus is not up-to-date.  PCP: [facility]. ? ? ? ?Past Medical History:  ?Diagnosis Date  ? Breast cancer ([place])   ? Breast cancer, right ([place]) 11/18/2017  ? Chronic kidney disease   ? Complication of anesthesia   ? Diabetes mellitus without complication ([place])   ? Heart murmur   ? Hypertension   ? Lichen sclerosus et atrophicus   ? Personal history of radiation therapy   ? PONV (postoperative nausea and vomiting)   ? ? ?Past Surgical History:  ?Procedure Laterality Date  ? ABDOMINAL HYSTERECTOMY    ? APPENDECTOMY    ? BREAST BIOPSY Right 11/18/2017  ? INVASIVE MAMMARY CARCINOMA.   ? BREAST LUMPECTOMY Right 12/06/2017  ? invasive mammary carcinoma DCIS LCIS  with RAD  ? CARPAL TUNNEL RELEASE Right 08/13/2020  ? Procedure: CARPAL TUNNEL RELEASE ENDOSCOPIC WITH RELEASE OF RIGHT LONG TRIGGER FINGER;  Surgeon: Corky Mull, MD;  Location: [facility] ORS;  Service: Orthopedics;  Laterality: Right;  ? COLONOSCOPY    ? COLONOSCOPY WITH PROPOFOL N/A 11/15/2017  ? Procedure: COLONOSCOPY WITH PROPOFOL;  Surgeon: Lollie Sails, MD;  Location: [facility] ENDOSCOPY;  Service: Endoscopy;   Laterality: N/A;  ? PARTIAL MASTECTOMY WITH NEEDLE LOCALIZATION Right 12/06/2017  ? Procedure: PARTIAL MASTECTOMY WITH NEEDLE LOCALIZATION;  Surgeon: Herbert Pun, MD;  Location: [facility] ORS;  Service: General;  Laterality: Right;  ? SENTINEL NODE BIOPSY Right 12/06/2017  ? Procedure: SENTINEL NODE BIOPSY;  Surgeon: Herbert Pun, MD;  Location: [facility] ORS;  Service: General;  Laterality: Right;  ? SEPTOPLASTY    ? TRIGGER FINGER RELEASE Right 12/03/2020  ? Procedure: RELEASE TRIGGER FINGER/A-1 PULLEY;  Surgeon: Corky Mull, MD;  Location: [facility] ORS;  Service: Orthopedics;  Laterality: Right;  ? TRIGGER FINGER RELEASE Left 02/11/2021  ? Procedure: RELEASE OF LEFT INDEX AND RING TRIGGER FINGERS AND ENDOSCOPIC LEFT CARPAL TUNNEL RELEASE;  Surgeon: Corky Mull, MD;  Location: [facility] ORS;  Service: Orthopedics;  Laterality: Left;  ? TUBAL LIGATION    ? ? ?Family History  ?Problem Relation Age of Onset  ? Breast cancer Mother 8  ? Cancer Mother   ? Cancer Maternal Aunt   ? Cancer Maternal Grandmother   ? ? ?Social History  ? ?Tobacco Use  ? Smoking status: Never  ? Smokeless tobacco: Never  ?Vaping Use  ? Vaping Use: Never used  ?Substance Use Topics  ? Alcohol use: Not Currently  ?  Comment: RAREly  ? Drug use: No  ? ? ?No current facility-administered medications for this encounter. ? ?Current Outpatient Medications:  ?  amLODipine (NORVASC) 5 MG tablet, Take 10 mg by mouth every morning., Disp: , Rfl:  ?  amoxicillin-clavulanate (AUGMENTIN) 875-125 MG tablet, Take 1 tablet by mouth 2 (two) times daily for 10 days., Disp: 20 tablet, Rfl: 0 ?  aspirin EC 81 MG tablet, Take 81 mg by mouth every evening. , Disp: , Rfl:  ?  B-D ULTRAFINE III SHORT PEN 31G X 8 MM MISC, U UTD, Disp: , Rfl:  ?  Continuous Blood Gluc Sensor (FREESTYLE LIBRE SENSOR SYSTEM) MISC, Use 3 each every 10 (ten) days, Disp: , Rfl:  ?  denosumab (PROLIA) 60 MG/ML SOSY injection, Inject 60 mg into the skin every 6 (six) months., Disp: , Rfl:  ?   fluorometholone (FML) 0.1 % ophthalmic suspension, Place 1 drop into the left eye 4 (four) times daily., Disp: , Rfl:  ?  hydrochlorothiazide (MICROZIDE) 12.5 MG capsule, Take 12.5 mg by mouth daily., Disp: , Rfl:  ?  insulin lispro (HUMALOG) 100 UNIT/ML injection, Inject 14 Units into the skin 2 (two) times daily before a meal., Disp: , Rfl:  ?  LANTUS SOLOSTAR 100 UNIT/ML Solostar Pen, Inject 40 Units into the skin every morning., Disp: , Rfl:  ?  letrozole (FEMARA) 2.5 MG tablet, Take 1 tablet (2.5 mg total) by mouth daily., Disp: 90 tablet, Rfl: 1 ?  losartan (COZAAR) 100 MG tablet, , Disp: , Rfl:  ?  meloxicam (MOBIC) 15 MG tablet, Take 15 mg by mouth daily., Disp: , Rfl:  ?  metFORMIN (GLUCOPHAGE) 1000 MG tablet, Take 1,000 mg by mouth 2 (two) times daily with a meal., Disp: , Rfl:  ?  Multiple Vitamin (MULTIVITAMIN) tablet, Take 1 tablet by mouth daily., Disp: , Rfl:  ?  Omega-3 Fatty Acids (FISH OIL) 1200 MG CAPS, Take 2,400 mg by mouth 2 (two) times daily. , Disp: , Rfl:  ?  PREVIDENT 5000 DRY MOUTH 1.1 % GEL dental gel, Place 1 application onto teeth at bedtime., Disp: , Rfl:  ?  rosuvastatin (CRESTOR) 5 MG tablet, Take 5 mg by mouth every other day. In the evening, Disp: , Rfl:  ?  acetaminophen (TYLENOL) 500 MG tablet, Take 500 mg by mouth every 6 (six) hours as needed for moderate pain., Disp: , Rfl:  ? ?Allergies  ?Allergen Reactions  ? Alendronate Itching  ? Bactrim [Sulfamethoxazole-Trimethoprim] Nausea And Vomiting  ? Empagliflozin Itching  ?  And constant UTIs  ? Macrodantin [Nitrofurantoin Macrocrystal] Other (See Comments)  ?  Flu like symptoms   ? Sulfa Antibiotics Other (See Comments)  ?  Flu like symptoms  ? Latex Itching  ? ? ? ?ROS ? ?As noted in HPI.  ? ?Physical Exam ? ?BP (!) 181/77 (BP Location: Left Arm)   Pulse 82   Temp 98.6 ?F (37 ?C) (Oral)   Resp 18   Ht 5' (1.524 m)   Wt 72.6 kg   SpO2 100%   BMI 31.25 kg/m?  ? ?Constitutional: Well developed, well nourished, no acute  distress ?Eyes:  EOMI, conjunctiva normal bilaterally ?HENT: Normocephalic, atraumatic,mucus membranes moist ?Respiratory: Normal inspiratory effort ?Cardiovascular: Normal rate ?GI: nondistended ?skin: Mild swelling right forearm.  Healed puncture wound right forearm.  Mildly tender 8 x 9 cm area of erythema dorsal right forearm.  No expressible purulent drainage, no surrounding induration, edema.  RP 2+.  Sensation distally grossly intact to light touch and temperature.  No pain with wrist range of motion, supination, pronation.  Marked area of erythema with a marker  for reference. ? ? ? ? ? ?Musculoskeletal: no deformities ?Neurologic: Alert & oriented x 3, no focal neuro deficits ?Psychiatric: Speech and behavior appropriate ? ? ?ED Course ? ? ?Medications  ?Tdap (BOOSTRIX) injection 0.5 mL (0.5 mLs Intramuscular Given 12/04/21 1128)  ? ? ?Orders Placed This Encounter  ?Procedures  ? DG Forearm Right  ?  Standing Status:   Standing  ?  Number of Occurrences:   1  ?  Order Specific Question:   Reason for Exam (SYMPTOM  OR DIAGNOSIS REQUIRED)  ?  Answer:   Cat bite 5 days ago rule out retained foreign body  ? ? ?No results found for this or any previous visit (from the past 24 hour(s)). ?DG Forearm Right ? ?Result Date: 12/04/2021 ?CLINICAL DATA:  Cat bite 5 days ago.  Evaluate for foreign body. EXAM: RIGHT FOREARM - 2 VIEW COMPARISON:  None. FINDINGS: There is no evidence of fracture or other focal bone lesions. Soft tissues are unremarkable. No radiopaque foreign bodies identified. IMPRESSION: 1. No foreign bodies identified 2. No acute osseous findings. Electronically Signed   By: Kerby Moors M.D.   On: 12/04/2021 11:44   ? ?ED Clinical Impression ? ?1. Cat bite, initial encounter   ?2. Elevated blood pressure reading in office with diagnosis of hypertension   ?  ? ?ED Assessment/Plan ? ?1.  Cat bite-updating tetanus.  Checking forearm x-ray to rule out retained foreign body.  Home with Augmentin.  May  continue Mobic and Tylenol as needed for pain.  Follow-up with PCP as needed.  ER return precautions given. ? ?Reviewed imaging independently.  No foreign bodies identified.  See radiology report for full details. ? ?Patient is

## 2021-12-04 NOTE — ED Triage Notes (Addendum)
Pt has a cat and the cat bit her on her right forearm x5days ago. ? ?Pt states that the wound has scabbed over and in the last 3 days, her right forearm began to turn red and is now itching. ?

## 2021-12-15 ENCOUNTER — Other Ambulatory Visit: Payer: Self-pay | Admitting: Internal Medicine

## 2021-12-18 ENCOUNTER — Other Ambulatory Visit: Payer: Self-pay

## 2021-12-18 ENCOUNTER — Emergency Department
Admission: EM | Admit: 2021-12-18 | Discharge: 2021-12-18 | Disposition: A | Discharge status: Home / Self Care | Payer: Medicare Other | Attending: Emergency Medicine | Admitting: Emergency Medicine

## 2021-12-18 DIAGNOSIS — E1122 Type 2 diabetes mellitus with diabetic chronic kidney disease: Secondary | ICD-10-CM | POA: Insufficient documentation

## 2021-12-18 DIAGNOSIS — R5383 Other fatigue: Secondary | ICD-10-CM | POA: Diagnosis not present

## 2021-12-18 DIAGNOSIS — Z853 Personal history of malignant neoplasm of breast: Secondary | ICD-10-CM | POA: Diagnosis not present

## 2021-12-18 DIAGNOSIS — N189 Chronic kidney disease, unspecified: Secondary | ICD-10-CM | POA: Diagnosis not present

## 2021-12-18 DIAGNOSIS — R197 Diarrhea, unspecified: Secondary | ICD-10-CM | POA: Diagnosis not present

## 2021-12-18 DIAGNOSIS — I129 Hypertensive chronic kidney disease with stage 1 through stage 4 chronic kidney disease, or unspecified chronic kidney disease: Secondary | ICD-10-CM | POA: Diagnosis not present

## 2021-12-18 DIAGNOSIS — R531 Weakness: Secondary | ICD-10-CM | POA: Diagnosis present

## 2021-12-18 LAB — BASIC METABOLIC PANEL
Anion gap: 8 (ref 5–15)
BUN: 38 mg/dL — ABNORMAL HIGH (ref 8–23)
CO2: 27 mmol/L (ref 22–32)
Calcium: 10.4 mg/dL — ABNORMAL HIGH (ref 8.9–10.3)
Chloride: 101 mmol/L (ref 98–111)
Creatinine, Ser: 1.2 mg/dL — ABNORMAL HIGH (ref 0.44–1.00)
GFR, Estimated: 48 mL/min — ABNORMAL LOW (ref >60)
Glucose, Bld: 228 mg/dL — ABNORMAL HIGH (ref 70–99)
Potassium: 4.2 mmol/L (ref 3.5–5.1)
Sodium: 136 mmol/L (ref 135–145)

## 2021-12-18 LAB — CBC
HCT: 39.9 % (ref 36.0–46.0)
Hemoglobin: 13 g/dL (ref 12.0–15.0)
MCH: 28.7 pg (ref 26.0–34.0)
MCHC: 32.6 g/dL (ref 30.0–36.0)
MCV: 88.1 fL (ref 80.0–100.0)
Platelets: 248 10*3/uL (ref 150–400)
RBC: 4.53 MIL/uL (ref 3.87–5.11)
RDW: 13.2 % (ref 11.5–15.5)
WBC: 10.3 10*3/uL (ref 4.0–10.5)
nRBC: 0 % (ref 0.0–0.2)

## 2021-12-18 LAB — URINALYSIS, ROUTINE W REFLEX MICROSCOPIC
Bacteria, UA: NONE SEEN
Bilirubin Urine: NEGATIVE
Glucose, UA: 150 mg/dL — AB
Hgb urine dipstick: NEGATIVE
Ketones, ur: 5 mg/dL — AB
Leukocytes,Ua: NEGATIVE
Nitrite: NEGATIVE
Protein, ur: 100 mg/dL — AB
Specific Gravity, Urine: 1.012 (ref 1.005–1.030)
Squamous Epithelial / HPF: NONE SEEN (ref 0–5)
pH: 6 (ref 5.0–8.0)

## 2021-12-18 LAB — CBG MONITORING, ED: Glucose-Capillary: 213 mg/dL — ABNORMAL HIGH (ref 70–99)

## 2021-12-18 LAB — TROPONIN I (HIGH SENSITIVITY)
Troponin I (High Sensitivity): 8 ng/L (ref <18)
Troponin I (High Sensitivity): 9 ng/L (ref <18)

## 2021-12-18 NOTE — ED Triage Notes (Signed)
Pt presents via EMS with complaints of generalized weakness with cold sweats. Per EMS, the patients PCP changed her medications to Losartan and she has noticed her BP fluctuating. Denies CP or SOB.  ?

## 2021-12-18 NOTE — Discharge Instructions (Signed)
Your work-up today including blood work and EKG was all reassuring.  Your cardiac enzymes did not show any evidence of heart attack.  Please discuss your blood pressure medication with your primary doctor as this could be the reason for your fatigue. ?

## 2021-12-18 NOTE — ED Notes (Signed)
Of note, Pt took 9 units of fast acting Insulin PTA. ?

## 2021-12-19 NOTE — ED Provider Notes (Signed)
? ?[facility] ?Provider Note ? ? ? Event Date/Time  ? First MD Initiated Contact with Patient 12/18/21 2003   ?  (approximate) ? ? ?History  ? ?Weakness ? ? ?HPI ? ?Catheleen Langhorne is a 76 y.o. female  with pmh CKD, DM, HTN presents with generalized weakness. Pt notes that she has felt tired all day and generally unwell. Slept most of the day. Then around 5pm she was in the kitchen and felt acutely unwell and developed a cold sweat. Checked her BP and it was elevated. Denies associated CP, SOB, palpitations, lightheadedness. Denies fevers, urinary symptoms, cough, abdominal pain. Did have some diarrhea. She wears a continuous glucose monitor and BP was not low at the time. Currently feels improved. Pts BP medication was recently changed several days ago, and she notes that she has generally not felt herself since the change.  ? ?  ? ?Past Medical History:  ?Diagnosis Date  ? Breast cancer ([place])   ? Breast cancer, right ([place]) 11/18/2017  ? Chronic kidney disease   ? Complication of anesthesia   ? Diabetes mellitus without complication ([place])   ? Heart murmur   ? Hypertension   ? Lichen sclerosus et atrophicus   ? Personal history of radiation therapy   ? PONV (postoperative nausea and vomiting)   ? ? ?Patient Active Problem List  ? Diagnosis Date Noted  ? Carcinoma of overlapping sites of right breast in female, estrogen receptor positive ([place]) 11/11/2021  ? Hyperlipidemia, mixed 11/11/2021  ? 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 OSA (obstructive sleep apnea) 06/20/2021  ? History of breast cancer 05/09/2021  ? Long term (current) use of aromatase inhibitors 05/09/2021  ? Carpal tunnel syndrome, right 07/22/2020  ? Hypercalcemia 03/20/2018  ? Osteopenia of neck of left femur 12/20/2017  ? Breast cancer, right ([place]) 11/18/2017  ? Cardiac murmur 09/22/2015  ? 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 Diabetes mellitus type II, controlled, with no complications  ([place]) 05/39/7673  ? Essential hypertension 06/10/2010  ? Herpes zoster 04/25/2007  ? ? ? ?Physical Exam  ?Triage Vital Signs: ?ED Triage Vitals [12/18/21 1910]  ?Enc Vitals Group  ?   BP 107/85  ?   Pulse Rate 88  ?   Resp 18  ?   Temp 98.1 ?F (36.7 ?C)  ?   Temp Source Oral  ?   SpO2 98 %  ?   Weight 162 lb (73.5 kg)  ?   Height 5' (1.524 m)  ?   Head Circumference   ?   Peak Flow   ?   Pain Score 0  ?   Pain Loc   ?   Pain Edu?   ?   Excl. in [place]?   ? ? ?Most recent vital signs: ?Vitals:  ? 12/18/21 2200 12/18/21 2230  ?BP: (!) 143/67 (!) 149/72  ?Pulse: 62 62  ?Resp: 19 17  ?Temp:    ?SpO2: 96% 95%  ? ? ? ?General: Awake, no distress.  ?CV:  Good peripheral perfusion. No edema ?Resp:  Normal effort. Lungs CTAB ?Abd:  No distention. Soft, no tenderness ?Neuro:             Awake, Alert, Oriented x 3  ?Other:   ? ? ?ED Results / Procedures / Treatments  ?Labs ?(all labs ordered are listed, but only abnormal results are displayed) ?Labs Reviewed  ?BASIC METABOLIC PANEL - Abnormal; Notable for the following components:  ?  Result Value  ? Glucose, Bld 228 (*)   ? BUN 38 (*)   ? Creatinine, Ser 1.20 (*)   ? Calcium 10.4 (*)   ? GFR, Estimated 48 (*)   ? All other components within normal limits  ?URINALYSIS, ROUTINE W REFLEX MICROSCOPIC - Abnormal; Notable for the following components:  ? Color, Urine STRAW (*)   ? APPearance CLEAR (*)   ? Glucose, UA 150 (*)   ? Ketones, ur 5 (*)   ? Protein, ur 100 (*)   ? All other components within normal limits  ?CBG MONITORING, ED - Abnormal; Notable for the following components:  ? Glucose-Capillary 213 (*)   ? All other components within normal limits  ?CBC  ?TROPONIN I (HIGH SENSITIVITY)  ?TROPONIN I (HIGH SENSITIVITY)  ? ? ? ?EKG ? ?EKG interpretation performed by myself: NSR, nml axis, nml intervals, no acute ischemic changes ? ? ? ?RADIOLOGY ? ? ? ?PROCEDURES: ? ?Critical Care performed: No ? ?.1-3 Lead EKG Interpretation ?Performed by: Rada Hay, MD ?Authorized  by: Rada Hay, MD  ? ?  Interpretation: normal   ?  ECG rate assessment: normal   ?  Rhythm: sinus rhythm   ?  Ectopy: none   ?  Conduction: normal   ? ?The patient is on the cardiac monitor to evaluate for evidence of arrhythmia and/or significant heart rate changes. ? ? ?MEDICATIONS ORDERED IN ED: ?Medications - No data to display ? ? ?IMPRESSION / MDM / ASSESSMENT AND PLAN / ED COURSE  ?I reviewed the triage vital signs and the nursing notes. ?             ?               ? ?Differential diagnosis includes, but is not limited to, viral illness, AKI, electrolyte abnormality, anemia, ACS ?76 yo female presents with generally feeling fatgiued and then an episode of cold sweats along with elevated BP this evening. There was no associated CP or other anginal symptoms. She has no focal infectious symptoms. VS are wnl. Pt appears well, exam is wnl. Reviewed her labs which are overall reassuring. BG elevated in the 200s but no signs of DKA and pt says this is what it usually runs. UA without e/o infection. Given her age and DM could certainly have ACS presenting with isolated fatigue and diaphoresis, thus checked trop x 2, which are negative, and EKG which has no ischemic changes. Ultimately with her feeling back to nml and no signs of ischemia I have low suspicion for other serious underlying pathology and think she is appropriate for dc. May have viral illness vs. Side effect of medication. She will f/u with her PCP.  ?Clinical Course as of 12/19/21 2145  ?Thu Dec 18, 2021  ?2142 Troponin I (High Sensitivity): 8 [KM]  ?  ?Clinical Course User Index ?[KM] Rada Hay, MD  ? ? ? ?FINAL CLINICAL IMPRESSION(S) / ED DIAGNOSES  ? ?Final diagnoses:  ?Other fatigue  ? ? ? ?Rx / DC Orders  ? ?ED Discharge Orders   ? ? None  ? ?  ? ? ? ?Note:  This document was prepared using Dragon voice recognition software and may include unintentional dictation errors. ?  ?Rada Hay, MD ?12/19/21 2145 ? ?

## 2021-12-30 ENCOUNTER — Other Ambulatory Visit: Payer: Self-pay | Admitting: Nurse Practitioner

## 2021-12-31 ENCOUNTER — Other Ambulatory Visit: Payer: Self-pay | Admitting: Nurse Practitioner

## 2021-12-31 DIAGNOSIS — E1122 Type 2 diabetes mellitus with diabetic chronic kidney disease: Secondary | ICD-10-CM

## 2021-12-31 DIAGNOSIS — I10 Essential (primary) hypertension: Secondary | ICD-10-CM

## 2022-01-07 ENCOUNTER — Ambulatory Visit
Admission: RE | Admit: 2022-01-07 | Discharge: 2022-01-07 | Disposition: A | Discharge status: Home / Self Care | Payer: Medicare Other | Source: Ambulatory Visit | Attending: Nurse Practitioner | Admitting: Nurse Practitioner

## 2022-01-07 ENCOUNTER — Inpatient Hospital Stay: Admission: RE | Admit: 2022-01-07 | Payer: Medicare Other | Source: Ambulatory Visit

## 2022-01-07 DIAGNOSIS — N1832 Chronic kidney disease, stage 3b: Secondary | ICD-10-CM

## 2022-01-07 DIAGNOSIS — I10 Essential (primary) hypertension: Secondary | ICD-10-CM

## 2022-01-08 ENCOUNTER — Other Ambulatory Visit: Payer: Medicare Other

## 2022-03-04 ENCOUNTER — Ambulatory Visit
Admission: EM | Admit: 2022-03-04 | Discharge: 2022-03-04 | Disposition: A | Discharge status: Home / Self Care | Payer: Medicare Other | Attending: Emergency Medicine | Admitting: Emergency Medicine

## 2022-03-04 ENCOUNTER — Other Ambulatory Visit: Payer: Self-pay

## 2022-03-04 DIAGNOSIS — S41132A Puncture wound without foreign body of left upper arm, initial encounter: Secondary | ICD-10-CM | POA: Diagnosis not present

## 2022-03-04 DIAGNOSIS — W5501XA Bitten by cat, initial encounter: Secondary | ICD-10-CM | POA: Diagnosis not present

## 2022-03-04 DIAGNOSIS — S41152A Open bite of left upper arm, initial encounter: Secondary | ICD-10-CM

## 2022-03-04 MED ORDER — AMOXICILLIN-POT CLAVULANATE 875-125 MG PO TABS: 1.0000 | ORAL_TABLET | Freq: Two times a day (BID) | ORAL | 0 refills | Status: DC

## 2022-03-04 NOTE — ED Provider Notes (Signed)
[facility]    CSN: [id number] Arrival date & time: 03/04/22  1252      History   Chief Complaint Chief Complaint  Patient presents with   Animal Bite    HPI Brianna Rogers is a 76 y.o. female.   Patient presents with cat bite to the left forearm occurring this morning.  Endorses that her cat was in her bed when she rolled over causing the cat to react.  Has cleaned the area water and applied topical antibiotic. denies pain, drainage, limited movement, numbness, tingling, fever or chills.   Past Medical History:  Diagnosis Date   Breast cancer ([place])    Breast cancer, right ([place]) 11/18/2017   Chronic kidney disease    Complication of anesthesia    Diabetes mellitus without complication ([place])    Heart murmur    Hypertension    Lichen sclerosus et atrophicus    Personal history of radiation therapy    PONV (postoperative nausea and vomiting)     Patient Active Problem List   Diagnosis Date Noted   Carcinoma of overlapping sites of right breast in female, estrogen receptor positive ([place]) 11/11/2021   Hyperlipidemia, mixed 11/11/2021   Microalbuminuric diabetic nephropathy ([place]) 11/11/2021   OSA (obstructive sleep apnea) 06/20/2021   History of breast cancer 05/09/2021   Long term (current) use of aromatase inhibitors 05/09/2021   Carpal tunnel syndrome, right 07/22/2020   Hypercalcemia 03/20/2018   Osteopenia of neck of left femur 12/20/2017   Breast cancer, right ([place]) 11/18/2017   Cardiac murmur 09/22/2015   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    BREAST LUMPECTOMY Right 12/06/2017   invasive mammary carcinoma DCIS LCIS  with RAD    CARPAL TUNNEL RELEASE Right 08/13/2020   Procedure: CARPAL TUNNEL RELEASE ENDOSCOPIC WITH RELEASE OF RIGHT LONG TRIGGER FINGER;  Surgeon: Corky Mull, MD;  Location: [facility] ORS;  Service: Orthopedics;  Laterality: Right;   COLONOSCOPY     COLONOSCOPY WITH PROPOFOL N/A 11/15/2017   Procedure: COLONOSCOPY WITH PROPOFOL;  Surgeon: Lollie Sails, MD;  Location: [facility] ENDOSCOPY;  Service: Endoscopy;  Laterality: N/A;   PARTIAL MASTECTOMY WITH NEEDLE LOCALIZATION Right 12/06/2017   Procedure: PARTIAL MASTECTOMY WITH NEEDLE LOCALIZATION;  Surgeon: Herbert Pun, MD;  Location: [facility] ORS;  Service: General;  Laterality: Right;   SENTINEL NODE BIOPSY Right 12/06/2017   Procedure: SENTINEL NODE BIOPSY;  Surgeon: Herbert Pun, MD;  Location: [facility] ORS;  Service: General;  Laterality: Right;   SEPTOPLASTY     TRIGGER FINGER RELEASE Right 12/03/2020   Procedure: RELEASE TRIGGER FINGER/A-1 PULLEY;  Surgeon: Corky Mull, MD;  Location: [facility] ORS;  Service: Orthopedics;  Laterality: Right;   TRIGGER FINGER RELEASE Left 02/11/2021   Procedure: RELEASE OF LEFT INDEX AND RING TRIGGER FINGERS AND ENDOSCOPIC LEFT CARPAL TUNNEL RELEASE;  Surgeon: Corky Mull, MD;  Location: [facility] ORS;  Service: Orthopedics;  Laterality: Left;   TUBAL LIGATION      OB History   No obstetric history on file.      Home Medications    Prior to Admission medications   Medication Sig Start Date End Date Taking? Authorizing Provider  amoxicillin-clavulanate (  AUGMENTIN) 875-125 MG tablet Take 1 tablet by mouth every 12 (twelve) hours. 03/04/22  Yes Jeriel Vivanco, Leitha Schuller, NP  acetaminophen (TYLENOL) 500 MG tablet Take 500 mg by mouth every 6 (six) hours as needed for moderate pain.    [provider]  amLODipine (NORVASC) 5 MG tablet Take 10 mg by mouth every morning. 10/01/20   [provider]  aspirin EC 81 MG tablet Take 81 mg by mouth every evening.     [provider]  B-D ULTRAFINE III  SHORT PEN 31G X 8 MM MISC U UTD 03/20/19   [provider]  Continuous Blood Gluc Sensor ([place]) MISC Use 3 each every 10 (ten) days 02/22/18   [provider]  denosumab (PROLIA) 60 MG/ML SOSY injection Inject 60 mg into the skin every 6 (six) months.    [provider]  fluorometholone (FML) 0.1 % ophthalmic suspension Place 1 drop into the left eye 4 (four) times daily. 11/06/21   [provider]  hydrochlorothiazide (MICROZIDE) 12.5 MG capsule Take 12.5 mg by mouth daily. 06/20/21   [provider]  insulin lispro (HUMALOG) 100 UNIT/ML injection Inject 14 Units into the skin 2 (two) times daily before a meal.    [provider]  LANTUS SOLOSTAR 100 UNIT/ML Solostar Pen Inject 40 Units into the skin every morning. 10/22/18   [provider]  letrozole (FEMARA) 2.5 MG tablet TAKE 1 TABLET(2.5 MG) BY MOUTH DAILY 12/15/21   Cammie Sickle, MD  losartan (COZAAR) 100 MG tablet  11/05/21   [provider]  meloxicam (MOBIC) 15 MG tablet Take 15 mg by mouth daily.    [provider]  metFORMIN (GLUCOPHAGE) 1000 MG tablet Take 1,000 mg by mouth 2 (two) times daily with a meal.    [provider]  Multiple Vitamin (MULTIVITAMIN) tablet Take 1 tablet by mouth daily.    [provider]  Omega-3 Fatty Acids (FISH OIL) 1200 MG CAPS Take 2,400 mg by mouth 2 (two) times daily.     [provider]  PREVIDENT 5000 DRY MOUTH 1.1 % GEL dental gel Place 1 application onto teeth at bedtime. 11/26/20   [provider]  rosuvastatin (CRESTOR) 5 MG tablet Take 5 mg by mouth every other day. In the evening    [provider]    Family History Family History  Problem Relation Age of Onset   Breast cancer Mother 41   Cancer Mother    Cancer Maternal Aunt    Cancer Maternal Grandmother     Social History Social History   Tobacco Use   Smoking status: Never    Smokeless tobacco: Never  Vaping Use   Vaping Use: Never used  Substance Use Topics   Alcohol use: Not Currently    Comment: RAREly   Drug use: No     Allergies   Alendronate, Bactrim [sulfamethoxazole-trimethoprim], Empagliflozin, Macrodantin [nitrofurantoin macrocrystal], Sulfa antibiotics, and Latex   Review of Systems Review of Systems  Constitutional: Negative.   Respiratory: Negative.    Cardiovascular: Negative.   Skin:  Positive for wound. Negative for color change, pallor and rash.     Physical Exam Triage Vital Signs ED Triage Vitals  Enc Vitals Group     BP 03/04/22 1306 (!) 184/65     Pulse Rate 03/04/22 1306 94     Resp 03/04/22 1306 19     Temp 03/04/22 1306 97.8 F (36.6 C)     Temp  Source 03/04/22 1306 Oral     SpO2 03/04/22 1306 99 %     Weight 03/04/22 1312 162 lb 0.6 oz (73.5 kg)     Height --      Head Circumference --      Peak Flow --      Pain Score 03/04/22 1301 0     Pain Loc --      Pain Edu? --      Excl. in [place]? --    No data found.  Updated Vital Signs BP (!) 184/65 (BP Location: Right Arm) Comment: notified provider  Pulse 94   Temp 97.8 F (36.6 C) (Oral)   Resp 19   Wt 162 lb 0.6 oz (73.5 kg)   SpO2 99%   BMI 31.65 kg/m   Visual Acuity Right Eye Distance:   Left Eye Distance:   Bilateral Distance:    Right Eye Near:   Left Eye Near:    Bilateral Near:     Physical Exam Constitutional:      Appearance: Normal appearance.  HENT:     Head: Normocephalic.  Eyes:     Extraocular Movements: Extraocular movements intact.  Skin:    Comments: Less than 0.5 cm puncture wound and 0.5 cm abrasion noted to the medial aspect of the left forearm, bleeding has subsided, no swelling, tenderness noted, mild ecchymosis present at the puncture wound, nondraining, 2+ brachial pulse, range of motion of arm intact, sensation intact  Neurological:     Mental Status: She is alert and oriented to person, place, and time. Mental status is  at baseline.  Psychiatric:        Mood and Affect: Mood normal.        Behavior: Behavior normal.      UC Treatments / Results  Labs (all labs ordered are listed, but only abnormal results are displayed) Labs Reviewed - No data to display  EKG   Radiology No results found.  Procedures Procedures (including critical care time)  Medications Ordered in UC Medications - No data to display  Initial Impression / Assessment and Plan / UC Course  I have reviewed the triage vital signs and the nursing notes.  Pertinent labs & imaging results that were available during my care of the patient were reviewed by me and considered in my medical decision making (see chart for details).  Puncture wound of left upper arm, initial encounter Cat bite of left upper arm, initial encounter  Wound has been healing and skin is currently intact, cleansed with chlorhexidine and saline in office, left open to air, patient placed on Augmentin 7-day course as she had infection with last cat bite, recommended daily cleansing with soap and water during normal hygiene, patting dry, given strict precautions for signs of infection or limitations muscularly to return to urgent care for reevaluation Final Clinical Impressions(s) / UC Diagnoses   Final diagnoses:  Puncture wound of left upper arm, initial encounter  Cat bite of left upper arm, initial encounter     Discharge Instructions      Skin is already begun to heal an area is now closed, has been cleansed today in office  You may cleanse daily with your normal hygiene, you do not have to apply any topical products to the area  Begin use of Augmentin twice daily for the next 7 days to prevent infection  At any point if you begin to have increased swelling, redness, pain, puslike drainage, fever, chills, numbness or tingling  or limited movement of the arm please follow-up with urgent care or your primary doctor for reevaluation   ED  Prescriptions     Medication Sig Dispense Auth. Provider   amoxicillin-clavulanate (AUGMENTIN) 875-125 MG tablet Take 1 tablet by mouth every 12 (twelve) hours. 14 tablet Victory Strollo, Leitha Schuller, NP      PDMP not reviewed this encounter.   Hans Eden, NP 03/04/22 1322

## 2022-03-04 NOTE — Discharge Instructions (Signed)
Skin is already begun to heal an area is now closed, has been cleansed today in office  You may cleanse daily with your normal hygiene, you do not have to apply any topical products to the area  Begin use of Augmentin twice daily for the next 7 days to prevent infection  At any point if you begin to have increased swelling, redness, pain, puslike drainage, fever, chills, numbness or tingling or limited movement of the arm please follow-up with urgent care or your primary doctor for reevaluation

## 2022-03-04 NOTE — ED Triage Notes (Signed)
Pt is here with a cat bite on her left arm that happened this morning at 6am, pt has not taken anything to relieve discomfort.

## 2022-03-09 IMAGING — MG MM DIGITAL SCREENING BILAT W/ TOMO AND CAD
6 of 10 series · 6 of 30 positions shown · non-contrast
Comparison: Previous exam(s).

CLINICAL DATA: Screening.

EXAM:
DIGITAL SCREENING BILATERAL MAMMOGRAM WITH TOMOSYNTHESIS AND CAD
TECHNIQUE: Bilateral screening digital craniocaudal and mediolateral oblique
mammograms were obtained. Bilateral screening digital breast
tomosynthesis was performed. The images were evaluated with
computer-aided detection.

[R MLO synth-2D]
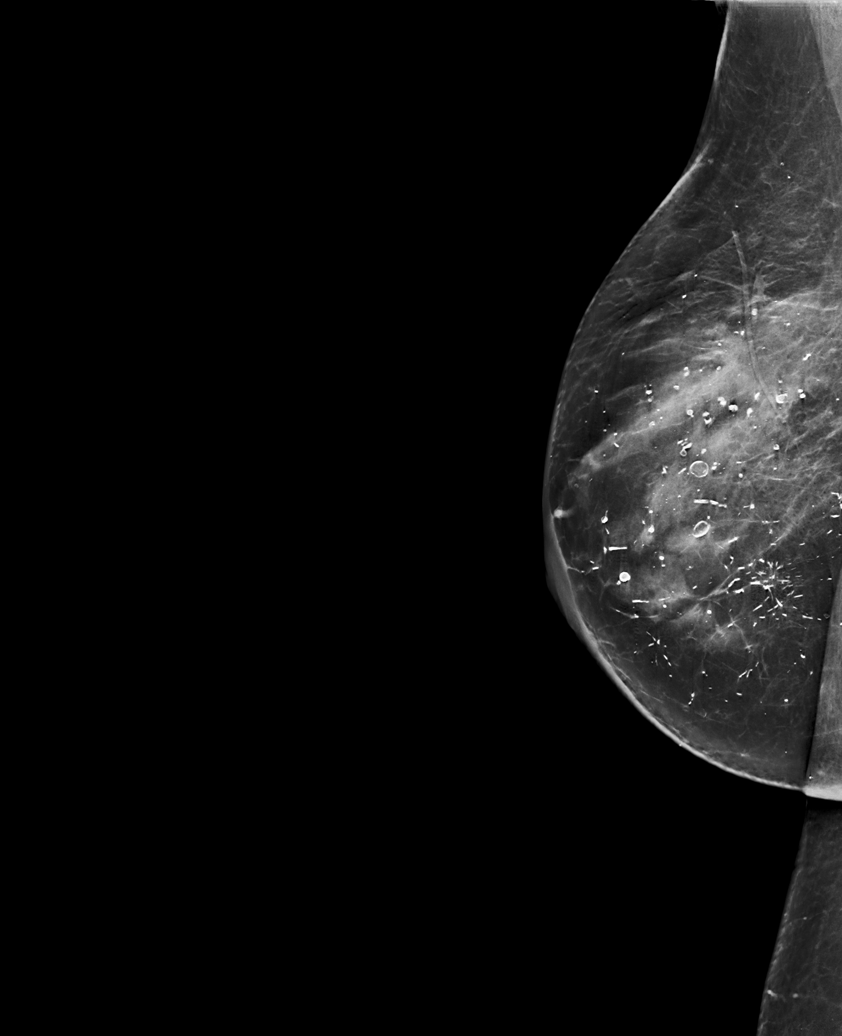

[L CC synth-2D]
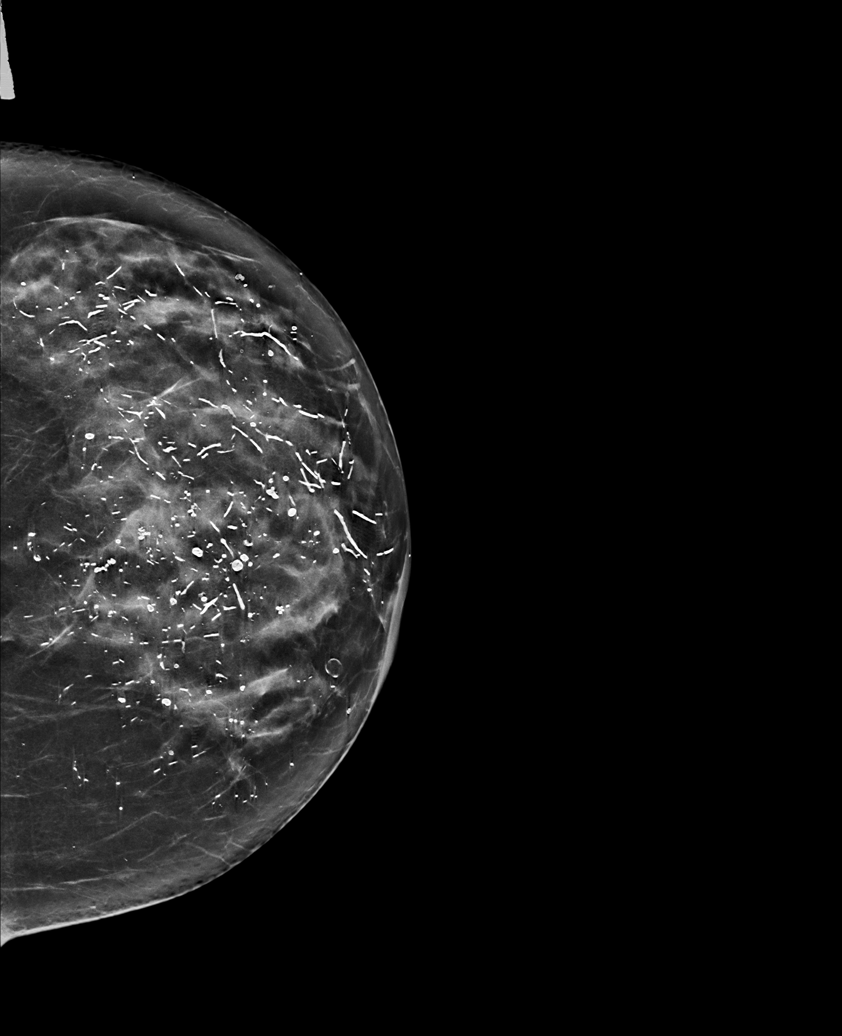

[R XCCL synth-2D]
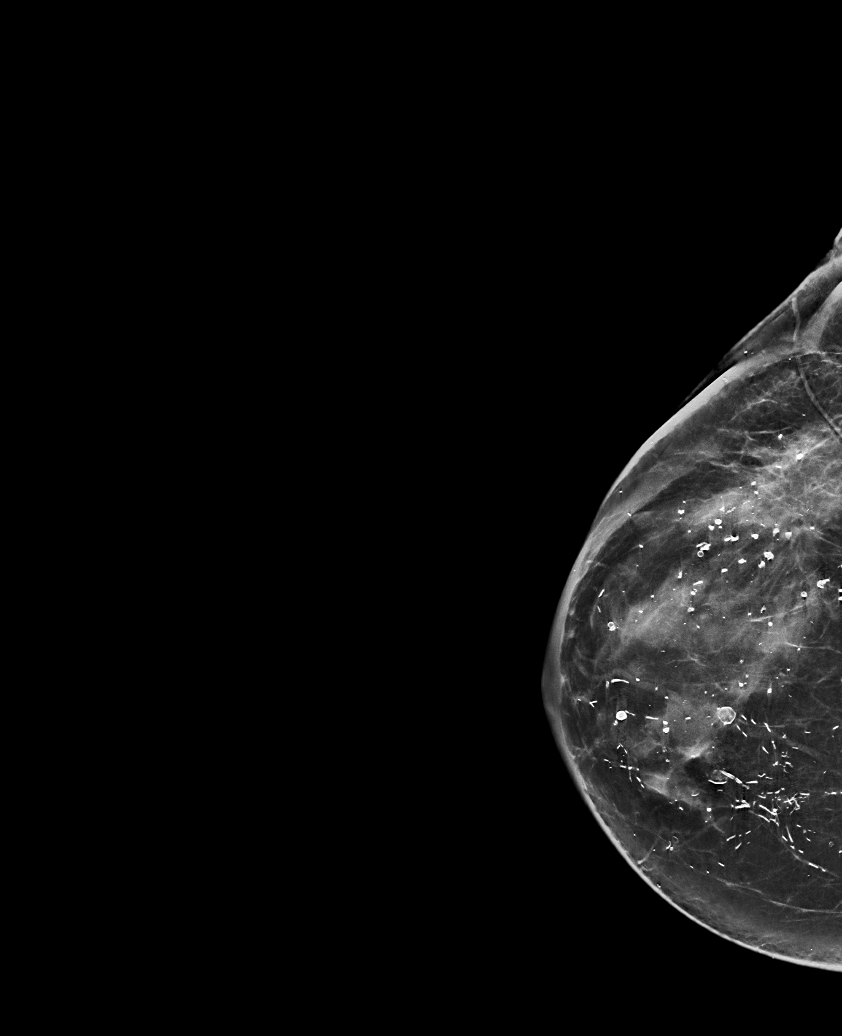

[L MLO synth-2D]
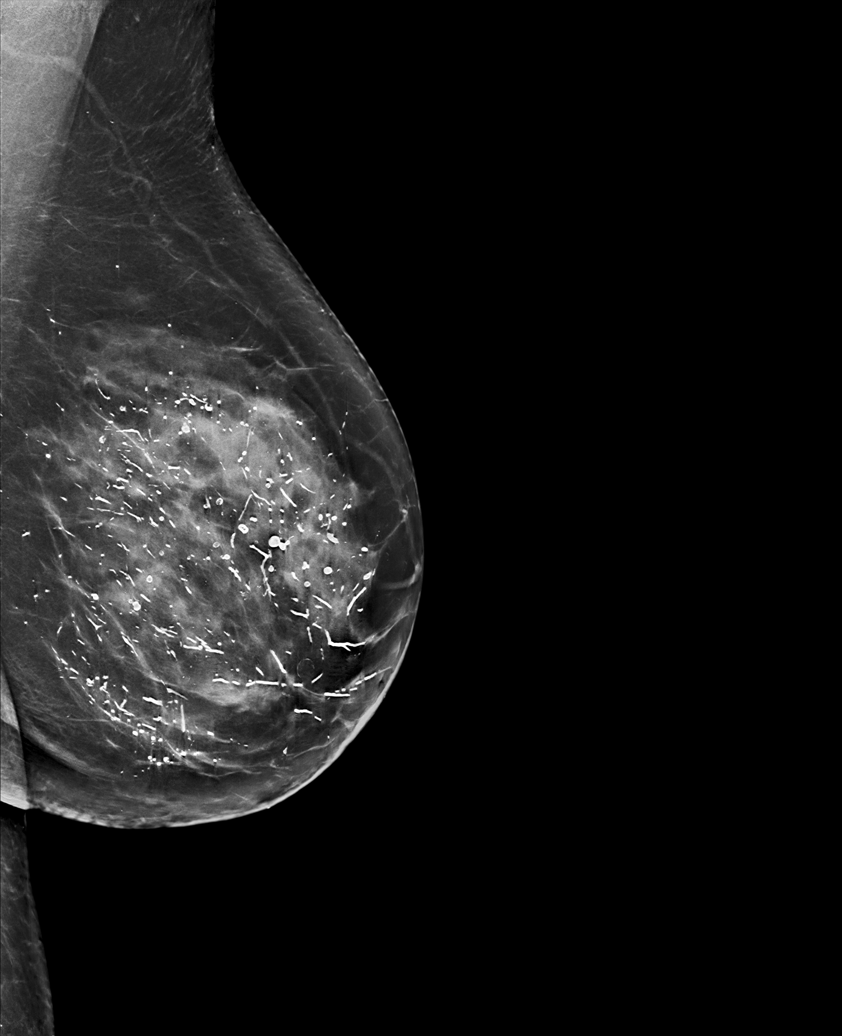

[R CC synth-2D]
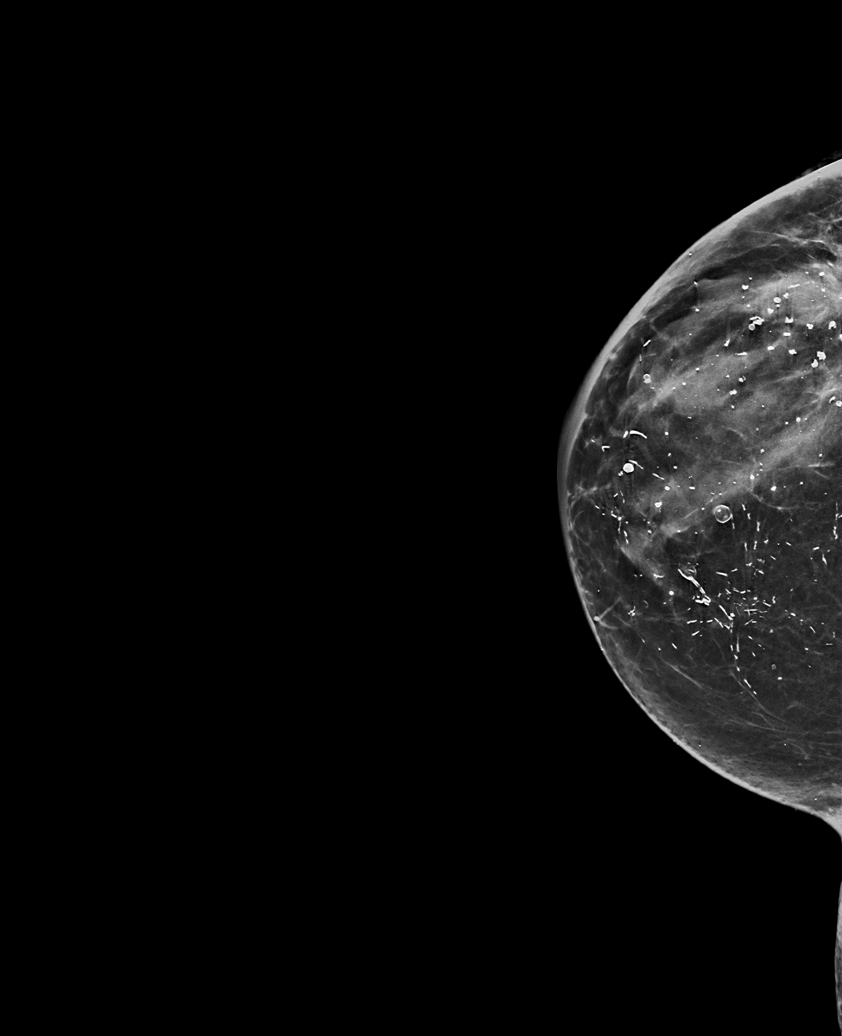

[L MLO tomo · tomo slice 44/87.0]
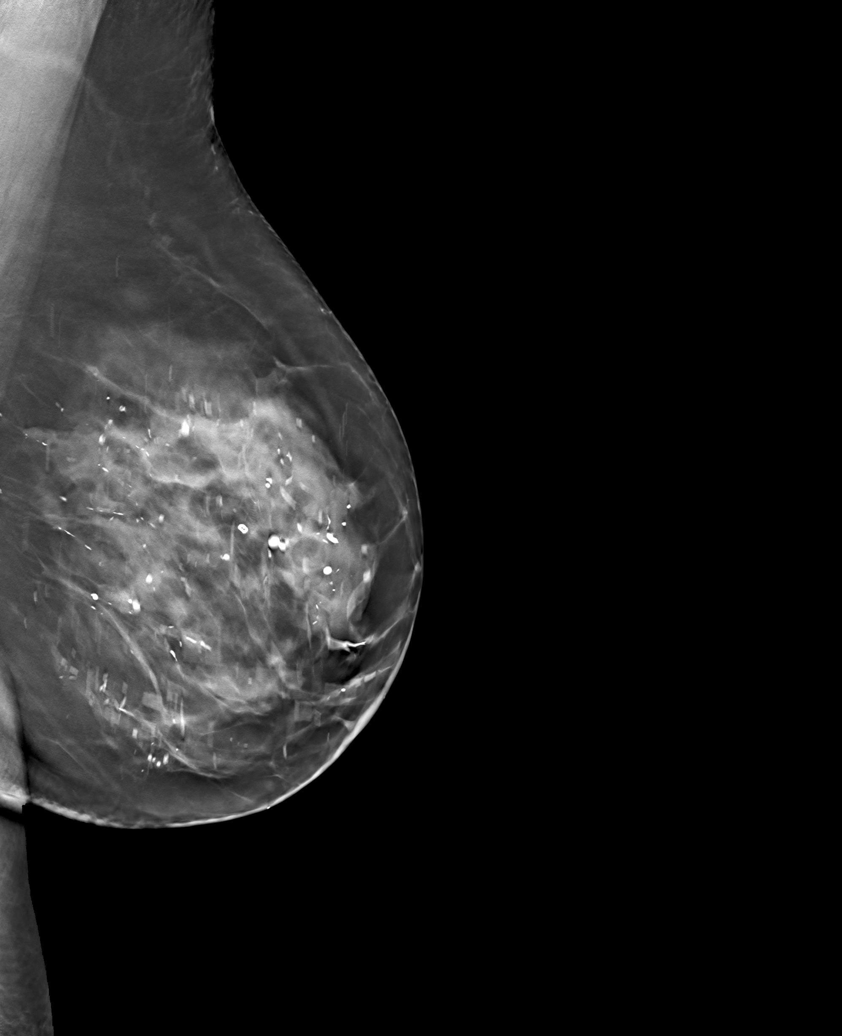

[6 of 30 positions shown; findings below may reference images not displayed]

ACR Breast Density Category d: The breast tissue is extremely dense,
which lowers the sensitivity of mammography
FINDINGS: There are no findings suspicious for malignancy.
IMPRESSION: No mammographic evidence of malignancy. A result letter of this
screening mammogram will be mailed directly to the patient.

RECOMMENDATION:
Screening mammogram in one year. (Code:TA-V-WV9)

BI-RADS CATEGORY  1: Negative.

## 2022-03-15 ENCOUNTER — Encounter: Payer: Self-pay | Admitting: Internal Medicine

## 2022-03-16 ENCOUNTER — Other Ambulatory Visit: Payer: Self-pay | Admitting: Internal Medicine

## 2022-03-16 ENCOUNTER — Other Ambulatory Visit: Payer: Self-pay

## 2022-03-16 DIAGNOSIS — Z170 Estrogen receptor positive status [ER+]: Secondary | ICD-10-CM

## 2022-03-16 NOTE — Telephone Encounter (Signed)
Last rx was for 90 day supply with 1 refill. Herbert Seta has addressed this concern.

## 2022-05-12 ENCOUNTER — Inpatient Hospital Stay: Payer: Medicare Other | Attending: Internal Medicine

## 2022-05-12 ENCOUNTER — Ambulatory Visit: Payer: Medicare Other | Admitting: Internal Medicine

## 2022-05-12 ENCOUNTER — Inpatient Hospital Stay (HOSPITAL_BASED_OUTPATIENT_CLINIC_OR_DEPARTMENT_OTHER): Payer: Medicare Other | Admitting: Medical Oncology

## 2022-05-12 ENCOUNTER — Encounter: Payer: Self-pay | Admitting: Medical Oncology

## 2022-05-12 ENCOUNTER — Inpatient Hospital Stay: Payer: Medicare Other

## 2022-05-12 VITALS — BP 160/79 | HR 70 | Temp 98.2°F | Resp 16 | Wt 156.6 lb

## 2022-05-12 DIAGNOSIS — I129 Hypertensive chronic kidney disease with stage 1 through stage 4 chronic kidney disease, or unspecified chronic kidney disease: Secondary | ICD-10-CM | POA: Insufficient documentation

## 2022-05-12 DIAGNOSIS — Z791 Long term (current) use of non-steroidal anti-inflammatories (NSAID): Secondary | ICD-10-CM | POA: Insufficient documentation

## 2022-05-12 DIAGNOSIS — C50911 Malignant neoplasm of unspecified site of right female breast: Secondary | ICD-10-CM

## 2022-05-12 DIAGNOSIS — Z794 Long term (current) use of insulin: Secondary | ICD-10-CM | POA: Diagnosis not present

## 2022-05-12 DIAGNOSIS — N6311 Unspecified lump in the right breast, upper outer quadrant: Secondary | ICD-10-CM

## 2022-05-12 DIAGNOSIS — Z853 Personal history of malignant neoplasm of breast: Secondary | ICD-10-CM | POA: Diagnosis not present

## 2022-05-12 DIAGNOSIS — Z170 Estrogen receptor positive status [ER+]: Secondary | ICD-10-CM

## 2022-05-12 DIAGNOSIS — Z9049 Acquired absence of other specified parts of digestive tract: Secondary | ICD-10-CM | POA: Insufficient documentation

## 2022-05-12 DIAGNOSIS — M85852 Other specified disorders of bone density and structure, left thigh: Secondary | ICD-10-CM

## 2022-05-12 DIAGNOSIS — Z923 Personal history of irradiation: Secondary | ICD-10-CM | POA: Insufficient documentation

## 2022-05-12 DIAGNOSIS — C50811 Malignant neoplasm of overlapping sites of right female breast: Secondary | ICD-10-CM | POA: Insufficient documentation

## 2022-05-12 DIAGNOSIS — M8580 Other specified disorders of bone density and structure, unspecified site: Secondary | ICD-10-CM | POA: Diagnosis not present

## 2022-05-12 DIAGNOSIS — N189 Chronic kidney disease, unspecified: Secondary | ICD-10-CM | POA: Insufficient documentation

## 2022-05-12 DIAGNOSIS — Z79811 Long term (current) use of aromatase inhibitors: Secondary | ICD-10-CM | POA: Diagnosis not present

## 2022-05-12 DIAGNOSIS — Z79899 Other long term (current) drug therapy: Secondary | ICD-10-CM | POA: Diagnosis not present

## 2022-05-12 LAB — CBC WITH DIFFERENTIAL/PLATELET
Abs Immature Granulocytes: 0.07 10*3/uL (ref 0.00–0.07)
Basophils Absolute: 0.1 10*3/uL (ref 0.0–0.1)
Basophils Relative: 1 %
Eosinophils Absolute: 0.3 10*3/uL (ref 0.0–0.5)
Eosinophils Relative: 4 %
HCT: 36.9 % (ref 36.0–46.0)
Hemoglobin: 12.5 g/dL (ref 12.0–15.0)
Immature Granulocytes: 1 %
Lymphocytes Relative: 20 %
Lymphs Abs: 1.8 10*3/uL (ref 0.7–4.0)
MCH: 29.7 pg (ref 26.0–34.0)
MCHC: 33.9 g/dL (ref 30.0–36.0)
MCV: 87.6 fL (ref 80.0–100.0)
Monocytes Absolute: 0.7 10*3/uL (ref 0.1–1.0)
Monocytes Relative: 8 %
Neutro Abs: 6 10*3/uL (ref 1.7–7.7)
Neutrophils Relative %: 66 %
Platelets: 206 10*3/uL (ref 150–400)
RBC: 4.21 MIL/uL (ref 3.87–5.11)
RDW: 13.2 % (ref 11.5–15.5)
WBC: 9 10*3/uL (ref 4.0–10.5)
nRBC: 0 % (ref 0.0–0.2)

## 2022-05-12 LAB — COMPREHENSIVE METABOLIC PANEL
ALT: 19 U/L (ref 0–44)
AST: 20 U/L (ref 15–41)
Albumin: 4 g/dL (ref 3.5–5.0)
Alkaline Phosphatase: 43 U/L (ref 38–126)
Anion gap: 8 (ref 5–15)
BUN: 33 mg/dL — ABNORMAL HIGH (ref 8–23)
CO2: 26 mmol/L (ref 22–32)
Calcium: 10.5 mg/dL — ABNORMAL HIGH (ref 8.9–10.3)
Chloride: 104 mmol/L (ref 98–111)
Creatinine, Ser: 1.08 mg/dL — ABNORMAL HIGH (ref 0.44–1.00)
GFR, Estimated: 54 mL/min — ABNORMAL LOW (ref >60)
Glucose, Bld: 197 mg/dL — ABNORMAL HIGH (ref 70–99)
Potassium: 4.5 mmol/L (ref 3.5–5.1)
Sodium: 138 mmol/L (ref 135–145)
Total Bilirubin: 0.9 mg/dL (ref 0.3–1.2)
Total Protein: 7.4 g/dL (ref 6.5–8.1)

## 2022-05-12 LAB — VITAMIN D 25 HYDROXY (VIT D DEFICIENCY, FRACTURES): Vit D, 25-Hydroxy: 37.93 ng/mL (ref 30–100)

## 2022-05-12 MED ORDER — DENOSUMAB 60 MG/ML ~~LOC~~ SOSY
60.0000 mg | PREFILLED_SYRINGE | Freq: Once | SUBCUTANEOUS | Status: AC
  Administered 2022-05-12: 60 mg via SUBCUTANEOUS
  Filled 2022-05-12: qty 1

## 2022-05-12 NOTE — Progress Notes (Signed)
Pt in for follow up, reports some tenderness in right outer breast and request breast exam today.

## 2022-05-12 NOTE — Progress Notes (Signed)
Symptom Management [place] at [facility] [phone] [phone]  Patient Care Team: Dayton Martes Victoriano Lain, NP as PCP - General (Internal Medicine) Ezequiel Kayser, MD as Consulting Physician (Internal Medicine) Cammie Sickle, MD as Consulting Physician (Internal Medicine)   Name of the patient: Brianna Rogers  [id number]  [birth year]   Date of visit: 05/12/22  Reason for Consult: Brianna Rogers is a 76 y.o. 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 Has been doing a lot of cleaning and questioned if she maybe strained the area. No other injury. She has been taking Mobic and prednisone for a trapezius muscle strain without much improvement. Area is tender to touch. Thinks she may feel a lump. Wanted to get checked out ahead of her follow up in Feb.   Denies any neurologic complaints. Denies recent fevers or illnesses. Denies any easy bleeding or bruising. Reports good appetite and denies weight loss. Denies chest pain. Denies any nausea, vomiting, constipation, or diarrhea. Denies urinary complaints. Patient offers no further specific complaints today.    PAST MEDICAL HISTORY: Past Medical History:  Diagnosis Date   Breast cancer ([place])    Breast cancer, right ([place]) 11/18/2017   Chronic kidney disease    Complication of anesthesia    Diabetes mellitus without complication (HCC)    Heart murmur    Hypertension    Lichen sclerosus et atrophicus    Personal history of radiation therapy    PONV (postoperative nausea and vomiting)     PAST SURGICAL HISTORY:  Past Surgical History:  Procedure Laterality Date   ABDOMINAL HYSTERECTOMY     APPENDECTOMY     BREAST BIOPSY Right 11/18/2017   INVASIVE MAMMARY CARCINOMA.     BREAST LUMPECTOMY Right 12/06/2017   invasive mammary carcinoma DCIS LCIS  with RAD   CARPAL TUNNEL RELEASE Right 08/13/2020   Procedure: CARPAL TUNNEL RELEASE ENDOSCOPIC WITH RELEASE OF RIGHT LONG TRIGGER FINGER;  Surgeon: Corky Mull, MD;  Location: [facility] ORS;  Service: Orthopedics;  Laterality: Right;   COLONOSCOPY     COLONOSCOPY WITH PROPOFOL N/A 11/15/2017   Procedure: COLONOSCOPY WITH PROPOFOL;  Surgeon: Lollie Sails, MD;  Location: [facility] ENDOSCOPY;  Service: Endoscopy;  Laterality: N/A;   PARTIAL MASTECTOMY WITH NEEDLE LOCALIZATION Right 12/06/2017   Procedure: PARTIAL MASTECTOMY WITH NEEDLE LOCALIZATION;  Surgeon: Herbert Pun, MD;  Location: [facility] ORS;  Service: General;  Laterality: Right;   SENTINEL NODE BIOPSY Right 12/06/2017   Procedure: SENTINEL NODE BIOPSY;  Surgeon: Herbert Pun, MD;  Location: [facility] ORS;  Service: General;  Laterality: Right;   SEPTOPLASTY     TRIGGER FINGER RELEASE Right 12/03/2020   Procedure: RELEASE TRIGGER FINGER/A-1 PULLEY;  Surgeon: Corky Mull, MD;  Location: [facility] ORS;  Service: Orthopedics;  Laterality: Right;   TRIGGER FINGER RELEASE Left 02/11/2021   Procedure: RELEASE OF LEFT INDEX AND RING TRIGGER FINGERS AND ENDOSCOPIC LEFT CARPAL TUNNEL RELEASE;  Surgeon: Corky Mull, MD;  Location: [facility] ORS;  Service: Orthopedics;  Laterality: Left;   TUBAL LIGATION      HEMATOLOGY/ONCOLOGY HISTORY:  Oncology History Overview Note  1.   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 Pathology revealed 1.7 cm grade III invasive mammary carcinoma  of now special type. 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 She underwent re-excision of margin. Persistent positive margin on re-excision with in situ carcinoma < 0.5 mm from margin. There was lymphovascular invasion. One SLN was negative. No chemotherapy based on oncotype. She received radiation completed 03/03/18. Started AI therapy  03/15/18.    2.   Osteopenia- osteopenia present on dexa scan prior to cancer diagnosis or AI treatment. She receives prolia every 6 months. Last bone density scan was 12/18/2019.       Carcinoma of overlapping sites of right breast in female, estrogen receptor positive ([place])  11/11/2021 Initial Diagnosis   Carcinoma of overlapping sites of right breast in female, estrogen receptor positive ([place])     ALLERGIES:  is allergic to alendronate, bactrim [sulfamethoxazole-trimethoprim], empagliflozin, macrodantin [nitrofurantoin macrocrystal], sulfa antibiotics, and latex.  MEDICATIONS:  Current Outpatient Medications  Medication Sig Dispense Refill   acetaminophen (TYLENOL) 500 MG tablet Take 500 mg by mouth every 6 (six) hours as needed for moderate pain.     amLODipine (NORVASC) 5 MG tablet Take 10 mg by mouth every morning.     aspirin EC 81 MG tablet Take 81 mg by mouth every evening.      B-D ULTRAFINE III SHORT PEN 31G X 8 MM MISC U UTD     Continuous Blood Gluc Sensor (FREESTYLE LIBRE SENSOR SYSTEM) MISC Use 3 each every 10 (ten) days     denosumab (PROLIA) 60 MG/ML SOSY injection Inject 60 mg into the skin every 6 (six) months.     fluorometholone (FML) 0.1 % ophthalmic suspension Place 1 drop into the left eye 4 (four) times daily.     hydrochlorothiazide (MICROZIDE) 12.5 MG capsule Take 12.5 mg by mouth daily.     insulin lispro (HUMALOG) 100 UNIT/ML injection Inject 11 Units into the skin 2 (two) times daily before a meal. 11 units at lunch and 9 units at dinner     letrozole (FEMARA) 2.5 MG tablet TAKE 1 TABLET(2.5 MG) BY MOUTH DAILY 90 tablet 1   lisinopril (ZESTRIL) 20 MG tablet Take 20 mg by mouth 2 (two) times daily.     meloxicam (MOBIC) 15 MG tablet Take 15 mg by mouth daily.     metFORMIN (GLUCOPHAGE) 1000 MG tablet Take 1,000 mg by mouth 2 (two) times daily with a meal.     Multiple Vitamin (MULTIVITAMIN) tablet Take 1 tablet by mouth daily.     Omega-3 Fatty Acids (FISH  OIL) 1200 MG CAPS Take 2,400 mg by mouth 2 (two) times daily.      PREVIDENT 5000 DRY MOUTH 1.1 % GEL dental gel Place 1 application onto teeth at bedtime.     rosuvastatin (CRESTOR) 5 MG tablet Take 5 mg by mouth every other day. In the evening     TOUJEO MAX SOLOSTAR 300 UNIT/ML Solostar Pen 46 units in morning     No current facility-administered medications for this visit.    VITAL SIGNS: BP (!) 160/79 (BP Location: Left Arm, Patient Position: Sitting)   Pulse 70   Temp 98.2 F (36.8 C) (Tympanic)   Resp 16   Wt 156 lb 9.6 oz (71 kg)   SpO2 100%   BMI 30.58 kg/m  Filed Weights   05/12/22 1045  Weight: 156 lb 9.6 oz (71 kg)    Estimated body mass index is 30.58 kg/m as calculated from the following:   Height as of 12/18/21: 5' (1.524 m).   Weight as of  this encounter: 156 lb 9.6 oz (71 kg).  LABS: CBC:    Component Value Date/Time   WBC 9.0 05/12/2022 1000   HGB 12.5 05/12/2022 1000   HCT 36.9 05/12/2022 1000   PLT 206 05/12/2022 1000   MCV 87.6 05/12/2022 1000   NEUTROABS 6.0 05/12/2022 1000   LYMPHSABS 1.8 05/12/2022 1000   MONOABS 0.7 05/12/2022 1000   EOSABS 0.3 05/12/2022 1000   BASOSABS 0.1 05/12/2022 1000   Comprehensive Metabolic Panel:    Component Value Date/Time   NA 138 05/12/2022 1000   K 4.5 05/12/2022 1000   CL 104 05/12/2022 1000   CO2 26 05/12/2022 1000   BUN 33 (H) 05/12/2022 1000   CREATININE 1.08 (H) 05/12/2022 1000   GLUCOSE 197 (H) 05/12/2022 1000   CALCIUM 10.5 (H) 05/12/2022 1000   CALCIUM 10.1 03/15/2018 1151   AST 20 05/12/2022 1000   ALT 19 05/12/2022 1000   ALKPHOS 43 05/12/2022 1000   BILITOT 0.9 05/12/2022 1000   PROT 7.4 05/12/2022 1000   ALBUMIN 4.0 05/12/2022 1000    RADIOGRAPHIC STUDIES: No results found.  PERFORMANCE STATUS (ECOG) : 1 - Symptomatic but completely ambulatory  Review of Systems Unless otherwise noted, a complete review of systems is negative.  Physical Exam General: NAD Cardiovascular: regular  rate and rhythm Pulmonary: clear ant fields Breast: There is a small round semi-mobile roughly 1 cm palpable mass of the right breast at 10 o'clock 2 inches from areola. Fibrocystic features of bilateral breasts  Extremities: no edema, no joint deformities Skin: no rashes Neurological: Weakness but otherwise nonfocal  Assessment and Plan- Patient is a 76 y.o. female  Encounter Diagnoses  Name Primary?   History of breast cancer Yes   Mass of upper outer quadrant of right breast    Malignant neoplasm of right breast in female, estrogen receptor positive, unspecified site of breast ([place])    New. Will need further work up. Diagnostic mammogram and [place] ordered and pending.    Patient expressed understanding and was in agreement with this plan. She also understands that She can call clinic at any time with any questions, concerns, or complaints.   Thank you for allowing me to participate in the care of this very pleasant patient.   Time Total: 25  Visit consisted of counseling and education dealing with the complex and emotionally intense issues of symptom management in the setting of serious illness.Greater than 50%  of this time was spent counseling and coordinating care related to the above assessment and plan.  Signed by: Nelwyn Salisbury, PA-C

## 2022-05-13 LAB — CALCITRIOL (1,25 DI-OH VIT D): Vit D, 1,25-Dihydroxy: 11 pg/mL — ABNORMAL LOW (ref 24.8–81.5)

## 2022-05-13 LAB — CANCER ANTIGEN 27.29: CA 27.29: 15.4 U/mL (ref 0.0–38.6)

## 2022-05-19 ENCOUNTER — Ambulatory Visit
Admission: RE | Admit: 2022-05-19 | Discharge: 2022-05-19 | Disposition: A | Discharge status: Home / Self Care | Payer: Medicare Other | Source: Ambulatory Visit | Attending: Medical Oncology | Admitting: Medical Oncology

## 2022-05-19 DIAGNOSIS — N6311 Unspecified lump in the right breast, upper outer quadrant: Secondary | ICD-10-CM

## 2022-05-19 DIAGNOSIS — Z853 Personal history of malignant neoplasm of breast: Secondary | ICD-10-CM | POA: Insufficient documentation

## 2022-06-05 ENCOUNTER — Other Ambulatory Visit: Payer: Self-pay | Admitting: Medical Oncology

## 2022-06-05 MED ORDER — ERGOCALCIFEROL 1.25 MG (50000 UT) PO CAPS: 50000.0000 [IU] | ORAL_CAPSULE | ORAL | 11 refills | Status: DC

## 2022-06-05 MED ORDER — ERGOCALCIFEROL 1.25 MG (50000 UT) PO CAPS: 50000.0000 [IU] | ORAL_CAPSULE | ORAL | 2 refills | Status: AC

## 2022-06-05 MED ORDER — OYSTER SHELL CALCIUM/D3 500-5 MG-MCG PO TABS: 2.0000 | ORAL_TABLET | Freq: Two times a day (BID) | ORAL | 2 refills | Status: AC

## 2022-06-22 ENCOUNTER — Telehealth: Payer: Self-pay | Admitting: Radiation Oncology

## 2022-06-22 NOTE — Telephone Encounter (Signed)
Pt stated that she needs to reschedule her appt with Dr. Baruch Gouty because she will be out of town.

## 2022-07-02 ENCOUNTER — Ambulatory Visit: Payer: Medicare Other | Admitting: Radiation Oncology

## 2022-07-20 ENCOUNTER — Ambulatory Visit: Payer: Medicare Other | Admitting: Radiation Oncology

## 2022-07-23 ENCOUNTER — Encounter: Payer: Self-pay | Admitting: Internal Medicine

## 2022-09-02 ENCOUNTER — Ambulatory Visit
Admission: EM | Admit: 2022-09-02 | Discharge: 2022-09-02 | Disposition: A | Discharge status: Home / Self Care | Payer: Medicare Other | Attending: Physician Assistant | Admitting: Physician Assistant

## 2022-09-02 ENCOUNTER — Ambulatory Visit (INDEPENDENT_AMBULATORY_CARE_PROVIDER_SITE_OTHER): Payer: Medicare Other

## 2022-09-02 ENCOUNTER — Ambulatory Visit: Payer: Medicare Other

## 2022-09-02 ENCOUNTER — Encounter: Payer: Self-pay | Admitting: Emergency Medicine

## 2022-09-02 DIAGNOSIS — M75101 Unspecified rotator cuff tear or rupture of right shoulder, not specified as traumatic: Secondary | ICD-10-CM | POA: Diagnosis not present

## 2022-09-02 DIAGNOSIS — M25511 Pain in right shoulder: Secondary | ICD-10-CM

## 2022-09-02 NOTE — ED Provider Notes (Signed)
[facility]    CSN: [id number] Arrival date & time: 09/02/22  0800      History   Chief Complaint Chief Complaint  Patient presents with   Shoulder Pain    right    HPI Brianna Rogers is a 76 y.o. female presenting for right shoulder pain over the past couple weeks which started after she was cleaning up her yard and "bringing things inside."  She reports that seem to get better after a few days and then over the past week it has worsened.  Reports making for apple cakes over the past few days and states that all of this during caused her to have increased pain and burning in her shoulder.  She says she thinks she tore something because as she was during she felt immediate burning pain in her shoulder.  She reports limited range of motion of her shoulder.  Reports that she is right-handed.  States she has had a hard time raising her right arm and cannot really brush her hair on that side.  She denies any problems with the shoulder in the past.  Has been taking 1 g of Tylenol 4 times a day for pain and states that it has helped.  Reports that she fashioned a sling out of the scarf and it has helped to keep her shoulder at her side and elbow at a 90 degree angle.  Reports this morning the shoulder was extremely stiff but she has regained a little bit of range of motion since stretching.  No other complaints.  Her medical history significant for CKD, diabetes, hypertension and breast cancer.  HPI  Past Medical History:  Diagnosis Date   Breast cancer ([place])    Breast cancer, right ([place]) 11/18/2017   Chronic kidney disease    Complication of anesthesia    Diabetes mellitus without complication (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) 19/62/2297   Essential hypertension 06/10/2010   Herpes zoster 04/25/2007    Past Surgical History:  Procedure Laterality Date   ABDOMINAL HYSTERECTOMY     APPENDECTOMY     BREAST BIOPSY Right 11/18/2017   INVASIVE MAMMARY CARCINOMA.    BREAST LUMPECTOMY Right 12/06/2017   invasive mammary carcinoma DCIS LCIS  with RAD   CARPAL TUNNEL RELEASE Right 08/13/2020   Procedure: CARPAL TUNNEL RELEASE ENDOSCOPIC WITH RELEASE OF RIGHT LONG TRIGGER FINGER;  Surgeon: Corky Mull, MD;  Location: [facility] ORS;  Service: Orthopedics;  Laterality: Right;   COLONOSCOPY     COLONOSCOPY WITH PROPOFOL N/A 11/15/2017   Procedure: COLONOSCOPY WITH PROPOFOL;  Surgeon: Lollie Sails, MD;  Location: [facility] ENDOSCOPY;  Service: Endoscopy;  Laterality: N/A;   PARTIAL MASTECTOMY WITH NEEDLE LOCALIZATION Right 12/06/2017   Procedure: PARTIAL MASTECTOMY WITH NEEDLE LOCALIZATION;  Surgeon: Herbert Pun, MD;  Location: [facility] ORS;  Service: General;  Laterality: Right;   SENTINEL NODE BIOPSY Right 12/06/2017   Procedure: SENTINEL NODE BIOPSY;  Surgeon: Herbert Pun, MD;  Location: [facility] ORS;  Service: General;  Laterality: Right;   SEPTOPLASTY     TRIGGER FINGER RELEASE Right 12/03/2020   Procedure: RELEASE TRIGGER FINGER/A-1 PULLEY;  Surgeon: Corky Mull, MD;  Location: [facility] ORS;  Service: Orthopedics;  Laterality: Right;   TRIGGER  FINGER RELEASE Left 02/11/2021   Procedure: RELEASE OF LEFT INDEX AND RING TRIGGER FINGERS AND ENDOSCOPIC LEFT CARPAL TUNNEL RELEASE;  Surgeon: Corky Mull, MD;  Location: [facility] ORS;  Service: Orthopedics;  Laterality: Left;   TUBAL LIGATION      OB History   No obstetric history on file.      Home Medications    Prior to Admission medications   Medication Sig Start Date End Date Taking? Authorizing Provider  acetaminophen (TYLENOL) 500 MG tablet Take 500 mg by mouth every 6 (six) hours as needed for moderate pain.   Yes [provider]  amLODipine (NORVASC) 5 MG tablet Take 10 mg by mouth every morning. 10/01/20  Yes [provider]  aspirin EC 81 MG tablet Take 81 mg by mouth every evening.    Yes [provider]  B-D ULTRAFINE III SHORT PEN 31G X 8 MM MISC U UTD 03/20/19  Yes [provider]  calcium-vitamin D (OSCAL WITH D) 500-5 MG-MCG tablet Take 2 tablets by mouth 2 (two) times daily. 06/05/22 09/03/22 Yes C[place]ton, Sarah M, PA-C  Continuous Blood Gluc Sensor ([place]) MISC Use 3 each every 10 (ten) days 02/22/18  Yes [provider]  denosumab (PROLIA) 60 MG/ML SOSY injection Inject 60 mg into the skin every 6 (six) months.   Yes [provider]  fluorometholone (FML) 0.1 % ophthalmic suspension Place 1 drop into the left eye 4 (four) times daily. 11/06/21  Yes [provider]  hydrochlorothiazide (MICROZIDE) 12.5 MG capsule Take 12.5 mg by mouth daily. 06/20/21  Yes [provider]  insulin lispro (HUMALOG) 100 UNIT/ML injection Inject 11 Units into the skin 2 (two) times daily before a meal. 11 units at lunch and 9 units at dinner   Yes [provider]  letrozole ([place]) 2.5 MG tablet TAKE 1 TABLET(2.5 MG) BY MOUTH DAILY 03/16/22  Yes Cammie Sickle, MD  lisinopril (ZESTRIL) 20 MG tablet Take 20 mg by mouth 2 (two) times daily. 12/19/21  Yes [provider]  meloxicam  (MOBIC) 15 MG tablet Take 15 mg by mouth daily.   Yes [provider]  metFORMIN (GLUCOPHAGE) 1000 MG tablet Take 1,000 mg by mouth 2 (two) times daily with a meal.   Yes [provider]  Multiple Vitamin (MULTIVITAMIN) tablet Take 1 tablet by mouth daily.   Yes [provider]  Omega-3 Fatty Acids (FISH OIL) 1200 MG CAPS Take 2,400 mg by mouth 2 (two) times daily.    Yes [provider]  PREVIDENT 5000 DRY MOUTH 1.1 % GEL dental gel Place 1 application onto teeth at bedtime. 11/26/20  Yes [provider]  rosuvastatin (CRESTOR) 5 MG tablet Take 5 mg by mouth every other day. In the evening   Yes [provider]  TOUJEO MAX SOLOSTAR 300 UNIT/ML Solostar Pen 46 units in morning 04/19/22  Yes  [provider]    Family History Family History  Problem Relation Age of Onset   Breast cancer Mother 7   Cancer Mother    Cancer Maternal Aunt    Cancer Maternal Grandmother     Social History Social History   Tobacco Use   Smoking status: Never   Smokeless tobacco: Never  Vaping Use   Vaping Use: Never used  Substance Use Topics   Alcohol use: Not Currently    Comment: RAREly   Drug use: No     Allergies   Alendronate, Bactrim [sulfamethoxazole-trimethoprim], Empagliflozin, Macrodantin [nitrofurantoin macrocrystal], Sulfa antibiotics, and Latex   Review of Systems Review of Systems  Musculoskeletal:  Positive for arthralgias. Negative for back pain, joint swelling, neck pain and neck stiffness.  Skin:  Negative for color change and wound.  Neurological:  Positive for numbness (chronic due to carpal tunnel bilateral hands). Negative for weakness.     Physical Exam Triage Vital Signs ED Triage Vitals  Enc Vitals Group     BP 09/02/22 0822 (!) 189/80     Pulse Rate 09/02/22 0822 67     Resp 09/02/22 0822 16     Temp 09/02/22 0822 98.9 F (37.2 C)     Temp src --      SpO2 09/02/22 0822 97 %     Weight 09/02/22 0821  156 lb 8.4 oz (71 kg)     Height 09/02/22 0821 5' (1.524 m)     Head Circumference --      Peak Flow --      Pain Score 09/02/22 0819 4     Pain Loc --      Pain Edu? --      Excl. in [place]? --    No data found.  Updated Vital Signs BP (!) 189/80 (BP Location: Right Arm)   Pulse 67   Temp 98.9 F (37.2 C)   Resp 16   Ht 5' (1.524 m)   Wt 156 lb 8.4 oz (71 kg)   SpO2 97%   BMI 30.57 kg/m      Physical Exam Vitals and nursing note reviewed.  Constitutional:      General: She is not in acute distress.    Appearance: Normal appearance. She is not ill-appearing or toxic-appearing.  HENT:     Head: Normocephalic and atraumatic.  Eyes:     General: No scleral icterus.       Right eye: No discharge.        Left eye: No discharge.     Conjunctiva/sclera: Conjunctivae normal.  Cardiovascular:     Rate and Rhythm: Normal rate and regular rhythm.     Heart sounds: Normal heart sounds.  Pulmonary:     Effort: Pulmonary effort is normal. No respiratory distress.     Breath sounds: Normal breath sounds.  Musculoskeletal:     Right shoulder: Tenderness (TTP biceps groove and over AC joint (minimal)) present. No swelling. Decreased range of motion (90 degrees flexion and abduction). Normal strength.     Cervical back: Neck supple.     Comments: +drop arm and supraspinatus testing on right  Skin:    General: Skin is dry.  Neurological:     General: No focal deficit present.     Mental Status: She is alert. Mental status is at baseline.     Motor: No weakness.     Gait: Gait normal.  Psychiatric:        Mood and Affect: Mood  normal.        Behavior: Behavior normal.        Thought Content: Thought content normal.      UC Treatments / Results  Labs (all labs ordered are listed, but only abnormal results are displayed) Labs Reviewed - No data to display  EKG   Radiology DG Shoulder Right  Result Date: 09/02/2022 CLINICAL DATA:  Shoulder pain for 2 weeks.  No known  injury. EXAM: RIGHT SHOULDER - 2+ VIEW COMPARISON:  None Available. FINDINGS: The bones appear mildly demineralized. There is no evidence of acute fracture or dislocation. The subacromial space is obliterated, consistent with a chronic rotator cuff tear. There are mild glenohumeral and acromioclavicular degenerative changes. IMPRESSION: 1. No acute osseous findings. 2. Chronic rotator cuff tear with obliteration of the subacromial space. Electronically Signed   By: Richardean Sale M.D.   On: 09/02/2022 08:51    Procedures Procedures (including critical care time)  Medications Ordered in UC Medications - No data to display  Initial Impression / Assessment and Plan / UC Course  I have reviewed the triage vital signs and the nursing notes.  Pertinent labs & imaging results that were available during my care of the patient were reviewed by me and considered in my medical decision making (see chart for details).   76 year old female presents for right shoulder pain off and on for the past 2 weeks which recently worsened over the past week.  No trauma to the shoulder.  No chronic problems with the shoulder.  On exam she has reduced range to about 90 degrees of flexion and abduction.  Painful range of motion.  Positive drop arm testing and supraspinatus testing.  Tenderness to palpation of the biceps groove and minimally over the [facility] joint.  X-ray of the shoulder obtained today shows evidence of chronic right rotator cuff tear.  Discussed result with patient.  Advised her to follow-up with orthopedics at this time as she may need MRI.  We discussed the possibility of surgery versus physical therapy.  Advised her the importance of light stretching every day so she does not have a frozen shoulder.  Advised continuing Tylenol, ice, elevation.  Declines pain medication at this time. Given sling as needed for comfort and discussed the importance of the gentle range of motion exercises thoroughly with her  though.  Information given for orthopedics clinics and also results physiotherapy.  Follow-up here as needed.   Final Clinical Impressions(s) / UC Diagnoses   Final diagnoses:  Nontraumatic tear of right rotator cuff, unspecified tear extent  Acute pain of right shoulder     Discharge Instructions      -You have a chronic rotator cuff tear.  See the handout about rotator cuff tears.  I would advise you to follow-up with orthopedics and physical therapy.  Make sure you are performing gentle range of motion exercises every day so you do not have a frozen shoulder.  May continue Tylenol for pain relief, ice, lidocaine patches.  You have a condition requiring you to follow up with Orthopedics so please call one of the following office for appointment:   [facility] [address] [place], [place] [address] Phone: [phone]  [facility] [address] [place], [place] [address] Phone: [phone]    Photo of [facility] [place], [place] of [facility] [place], [place], [place] 5.0  (16)Physical therapy clinic Open ? Closes 4?PM  [facility] [place], [facility] Physical  therapy clinic [place]  [phone]   ED Prescriptions   None    PDMP not reviewed this encounter.   Danton Clap, PA-C 09/02/22 [phone]

## 2022-09-02 NOTE — Discharge Instructions (Addendum)
-  You have a chronic rotator cuff tear.  See the handout about rotator cuff tears.  I would advise you to follow-up with orthopedics and physical therapy.  Make sure you are performing gentle range of motion exercises every day so you do not have a frozen shoulder.  May continue Tylenol for pain relief, ice, lidocaine patches.  You have a condition requiring you to follow up with Orthopedics so please call one of the following office for appointment:   [facility] [address] [place], [place] [address] Phone: [phone]  [facility] [address], [place], [place] [address] Phone: [phone]    [facility] [place], [facility] Physical therapy clinic [place]  [phone]

## 2022-09-02 NOTE — ED Triage Notes (Signed)
Pt c/o right shoulder pain. Started a couple of weeks ago. She states she injured it in the yard but last week she was stirring a cake and felt it "catch" again. She states she can not lift her to fix her hair. She has been taking tylenol and seems to help.

## 2022-09-08 ENCOUNTER — Other Ambulatory Visit: Payer: Self-pay | Admitting: Internal Medicine

## 2022-10-13 ENCOUNTER — Encounter: Payer: Self-pay | Admitting: Internal Medicine

## 2022-11-04 ENCOUNTER — Other Ambulatory Visit: Payer: Self-pay | Admitting: Internal Medicine

## 2022-11-04 DIAGNOSIS — Z1231 Encounter for screening mammogram for malignant neoplasm of breast: Secondary | ICD-10-CM

## 2022-11-09 ENCOUNTER — Ambulatory Visit
Admission: RE | Admit: 2022-11-09 | Discharge: 2022-11-09 | Disposition: A | Discharge status: Home / Self Care | Payer: Medicare Other | Source: Ambulatory Visit | Attending: Internal Medicine | Admitting: Internal Medicine

## 2022-11-09 DIAGNOSIS — Z1231 Encounter for screening mammogram for malignant neoplasm of breast: Secondary | ICD-10-CM | POA: Diagnosis not present

## 2022-11-11 ENCOUNTER — Other Ambulatory Visit: Payer: Self-pay | Admitting: *Deleted

## 2022-11-11 ENCOUNTER — Other Ambulatory Visit: Payer: Self-pay

## 2022-11-11 DIAGNOSIS — N6311 Unspecified lump in the right breast, upper outer quadrant: Secondary | ICD-10-CM

## 2022-11-12 ENCOUNTER — Inpatient Hospital Stay: Payer: Medicare Other

## 2022-11-12 ENCOUNTER — Encounter: Payer: Self-pay | Admitting: Internal Medicine

## 2022-11-12 ENCOUNTER — Inpatient Hospital Stay: Payer: Medicare Other | Attending: Internal Medicine | Admitting: Internal Medicine

## 2022-11-12 ENCOUNTER — Other Ambulatory Visit: Payer: Self-pay

## 2022-11-12 ENCOUNTER — Telehealth: Payer: Self-pay | Admitting: *Deleted

## 2022-11-12 VITALS — BP 178/83 | HR 69 | Temp 97.1°F | Resp 18 | Wt 160.9 lb

## 2022-11-12 DIAGNOSIS — M8580 Other specified disorders of bone density and structure, unspecified site: Secondary | ICD-10-CM | POA: Insufficient documentation

## 2022-11-12 DIAGNOSIS — C50811 Malignant neoplasm of overlapping sites of right female breast: Secondary | ICD-10-CM | POA: Insufficient documentation

## 2022-11-12 DIAGNOSIS — Z79811 Long term (current) use of aromatase inhibitors: Secondary | ICD-10-CM | POA: Insufficient documentation

## 2022-11-12 DIAGNOSIS — N6311 Unspecified lump in the right breast, upper outer quadrant: Secondary | ICD-10-CM

## 2022-11-12 DIAGNOSIS — N183 Chronic kidney disease, stage 3 unspecified: Secondary | ICD-10-CM | POA: Diagnosis not present

## 2022-11-12 DIAGNOSIS — Z170 Estrogen receptor positive status [ER+]: Secondary | ICD-10-CM | POA: Insufficient documentation

## 2022-11-12 DIAGNOSIS — M85852 Other specified disorders of bone density and structure, left thigh: Secondary | ICD-10-CM

## 2022-11-12 LAB — CBC WITH DIFFERENTIAL (CANCER CENTER ONLY)
Abs Immature Granulocytes: 0.05 10*3/uL (ref 0.00–0.07)
Basophils Absolute: 0.1 10*3/uL (ref 0.0–0.1)
Basophils Relative: 1 %
Eosinophils Absolute: 0.4 10*3/uL (ref 0.0–0.5)
Eosinophils Relative: 5 %
HCT: 37.6 % (ref 36.0–46.0)
Hemoglobin: 12.5 g/dL (ref 12.0–15.0)
Immature Granulocytes: 1 %
Lymphocytes Relative: 21 %
Lymphs Abs: 1.9 10*3/uL (ref 0.7–4.0)
MCH: 29.3 pg (ref 26.0–34.0)
MCHC: 33.2 g/dL (ref 30.0–36.0)
MCV: 88.3 fL (ref 80.0–100.0)
Monocytes Absolute: 0.6 10*3/uL (ref 0.1–1.0)
Monocytes Relative: 7 %
Neutro Abs: 6 10*3/uL (ref 1.7–7.7)
Neutrophils Relative %: 65 %
Platelet Count: 213 10*3/uL (ref 150–400)
RBC: 4.26 MIL/uL (ref 3.87–5.11)
RDW: 13.1 % (ref 11.5–15.5)
WBC Count: 9 10*3/uL (ref 4.0–10.5)
nRBC: 0 % (ref 0.0–0.2)

## 2022-11-12 LAB — CMP (CANCER CENTER ONLY)
ALT: 19 U/L (ref 0–44)
AST: 21 U/L (ref 15–41)
Albumin: 3.9 g/dL (ref 3.5–5.0)
Alkaline Phosphatase: 45 U/L (ref 38–126)
Anion gap: 11 (ref 5–15)
BUN: 27 mg/dL — ABNORMAL HIGH (ref 8–23)
CO2: 26 mmol/L (ref 22–32)
Calcium: 10.8 mg/dL — ABNORMAL HIGH (ref 8.9–10.3)
Chloride: 101 mmol/L (ref 98–111)
Creatinine: 1.38 mg/dL — ABNORMAL HIGH (ref 0.44–1.00)
GFR, Estimated: 40 mL/min — ABNORMAL LOW (ref >60)
Glucose, Bld: 157 mg/dL — ABNORMAL HIGH (ref 70–99)
Potassium: 4.3 mmol/L (ref 3.5–5.1)
Sodium: 138 mmol/L (ref 135–145)
Total Bilirubin: 0.8 mg/dL (ref 0.3–1.2)
Total Protein: 7.2 g/dL (ref 6.5–8.1)

## 2022-11-12 LAB — VITAMIN D 25 HYDROXY (VIT D DEFICIENCY, FRACTURES): Vit D, 25-Hydroxy: 64.06 ng/mL (ref 30–100)

## 2022-11-12 MED ORDER — DENOSUMAB 60 MG/ML ~~LOC~~ SOSY
60.0000 mg | PREFILLED_SYRINGE | Freq: Once | SUBCUTANEOUS | Status: AC
  Administered 2022-11-12: 60 mg via SUBCUTANEOUS
  Filled 2022-11-12: qty 1

## 2022-11-12 NOTE — Telephone Encounter (Signed)
Faxed Lab results with notation of elevated calcium level to PCP Gaetano Net NP. Request follow up with PCP to handle. Has appt there in March. Recommended stopping Vit D and calcium.

## 2022-11-12 NOTE — Progress Notes (Signed)
[facility] OFFICE PROGRESS NOTE  Patient Care Team: Gauger, Victoriano Lain, NP as PCP - General (Internal Medicine) Ezequiel Kayser, MD as Consulting Physician (Internal Medicine) Cammie Sickle, MD as Consulting Physician (Internal Medicine)  SUMMARY OF ONCOLOGIC HISTORY: Oncology History Overview Note  1.   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 Pathology revealed 1.7 cm grade III invasive mammary carcinoma of now special type. 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 She underwent re-excision of margin. Persistent positive margin on re-excision with in situ carcinoma < 0.5 mm from margin. There was lymphovascular invasion. One SLN was negative. No chemotherapy based on oncotype. She received radiation completed 03/03/18. Started AI therapy 03/15/18.    2.   Osteopenia- osteopenia present on dexa scan prior to cancer diagnosis or AI treatment. She receives prolia every 6 months. Last bone density scan was 12/18/2019.       Carcinoma of overlapping sites of right breast in female, estrogen receptor positive ([place])  11/11/2021 Initial Diagnosis   Carcinoma of overlapping sites of right breast in female, estrogen receptor positive ([place])    INTERVAL HISTORY: Alone.  Ambulating independently.  77 year old female patient with a history of stage I ER/PR positive breast cancer HER2 negative currently on letrozole is here for follow-up.  Patient denies any new lumps or bumps.  Denies any chest pain or shortness of breath or cough.  Patient had a recent episode of neck pain for which she was prescribed prednisone.  She is currently off prednisone.  The neck pain is improved.  Review of Systems  Constitutional:  Negative for chills, diaphoresis, fever, malaise/fatigue and weight loss.  HENT:  Negative for nosebleeds and sore throat.   Eyes:  Negative for double vision.  Respiratory:  Negative for cough,  hemoptysis, sputum production, shortness of breath and wheezing.   Cardiovascular:  Negative for chest pain, palpitations, orthopnea and leg swelling.  Gastrointestinal:  Negative for abdominal pain, blood in stool, constipation, diarrhea, heartburn, melena, nausea and vomiting.  Genitourinary:  Negative for dysuria, frequency and urgency.  Musculoskeletal:  Positive for joint pain and neck pain. Negative for back pain.  Skin: Negative.  Negative for itching and rash.  Neurological:  Negative for dizziness, tingling, focal weakness, weakness and headaches.  Endo/Heme/Allergies:  Does not bruise/bleed easily.  Psychiatric/Behavioral:  Negative for depression. The patient is not nervous/anxious and does not have insomnia.      ALLERGIES:  is allergic to alendronate, bactrim [sulfamethoxazole-trimethoprim], empagliflozin, macrodantin [nitrofurantoin macrocrystal], sulfa antibiotics, and latex.  MEDICATIONS:  Current Outpatient Medications  Medication Sig Dispense Refill   acetaminophen (TYLENOL) 500 MG tablet Take 500 mg by mouth every 6 (six) hours as needed for moderate pain.     amLODipine (NORVASC) 5 MG tablet Take 10 mg by mouth every morning.     aspirin EC 81 MG tablet Take 81 mg by mouth every evening.      calcium-vitamin D (OSCAL WITH D) 500-5 MG-MCG tablet Take 2 tablets by mouth 2 (two) times daily. 120 tablet 2   Continuous Blood Gluc Sensor (FREESTYLE LIBRE SENSOR SYSTEM) MISC Use 3 each every 10 (ten) days     cyanocobalamin (VITAMIN B12) 1000 MCG tablet Take 1,000 mcg by mouth daily.     cyanocobalamin (VITAMIN B12) 1000 MCG/ML injection Inject 1,000 mcg into the muscle every 30 (thirty) days.     denosumab (PROLIA) 60 MG/ML SOSY injection Inject 60 mg into the skin  every 6 (six) months.     hydrochlorothiazide (MICROZIDE) 12.5 MG capsule Take 12.5 mg by mouth daily.     insulin lispro (HUMALOG) 100 UNIT/ML injection Inject 11 Units into the skin 2 (two) times daily before a  meal. 11 units at lunch and 9 units at dinner     letrozole (FEMARA) 2.5 MG tablet TAKE 1 TABLET(2.5 MG) BY MOUTH DAILY 90 tablet 1   lisinopril (ZESTRIL) 20 MG tablet Take 20 mg by mouth 2 (two) times daily.     meloxicam (MOBIC) 15 MG tablet Take 15 mg by mouth daily.     metFORMIN (GLUCOPHAGE) 1000 MG tablet Take 1,000 mg by mouth 2 (two) times daily with a meal.     Multiple Vitamin (MULTIVITAMIN) tablet Take 1 tablet by mouth daily.     Omega-3 Fatty Acids (FISH OIL) 1200 MG CAPS Take 2,400 mg by mouth 2 (two) times daily.      PREVIDENT 5000 DRY MOUTH 1.1 % GEL dental gel Place 1 application onto teeth at bedtime.     rosuvastatin (CRESTOR) 5 MG tablet Take 5 mg by mouth every other day. In the evening     TOUJEO MAX SOLOSTAR 300 UNIT/ML Solostar Pen 46 units in morning     Vitamin D, Ergocalciferol, (DRISDOL) 1.25 MG (50000 UNIT) CAPS capsule Take 50,000 Units by mouth every 7 (seven) days.     B-D ULTRAFINE III SHORT PEN 31G X 8 MM MISC U UTD (Patient not taking: Reported on 11/12/2022)     fluorometholone (FML) 0.1 % ophthalmic suspension Place 1 drop into the left eye 4 (four) times daily. (Patient not taking: Reported on 11/12/2022)     No current facility-administered medications for this visit.    PHYSICAL EXAMINATION: ECOG PERFORMANCE STATUS: 0 - Asymptomatic  Vitals:   11/12/22 1000  BP: (!) 178/83  Pulse: 69  Resp: 18  Temp: (!) 97.1 F (36.2 C)   Filed Weights   11/12/22 1000  Weight: 160 lb 14.4 oz (73 kg)    Physical Exam Vitals and nursing note reviewed.  HENT:     Head: Normocephalic and atraumatic.     Mouth/Throat:     Pharynx: Oropharynx is clear.  Eyes:     Extraocular Movements: Extraocular movements intact.     Pupils: Pupils are equal, round, and reactive to light.  Cardiovascular:     Rate and Rhythm: Normal rate and regular rhythm.  Pulmonary:     Comments: Decreased breath sounds bilaterally.  Abdominal:     Palpations: Abdomen is soft.   Musculoskeletal:        General: Normal range of motion.     Cervical back: Normal range of motion.  Skin:    General: Skin is warm.  Neurological:     General: No focal deficit present.     Mental Status: She is alert and oriented to person, place, and time.  Psychiatric:        Behavior: Behavior normal.        Judgment: Judgment normal.     LABORATORY DATA:  I have reviewed the data as listed    Component Value Date/Time   NA 138 11/12/2022 1003   K 4.3 11/12/2022 1003   CL 101 11/12/2022 1003   CO2 26 11/12/2022 1003   GLUCOSE 157 (H) 11/12/2022 1003   BUN 27 (H) 11/12/2022 1003   CREATININE 1.38 (H) 11/12/2022 1003   CALCIUM 10.8 (H) 11/12/2022 1003   CALCIUM 10.1 03/15/2018 1151  PROT 7.2 11/12/2022 1003   ALBUMIN 3.9 11/12/2022 1003   AST 21 11/12/2022 1003   ALT 19 11/12/2022 1003   ALKPHOS 45 11/12/2022 1003   BILITOT 0.8 11/12/2022 1003   GFRNONAA 40 (L) 11/12/2022 1003   GFRAA >60 05/01/2020 1339    No results found for: "SPEP", "UPEP"  Lab Results  Component Value Date   WBC 9.0 11/12/2022   NEUTROABS 6.0 11/12/2022   HGB 12.5 11/12/2022   HCT 37.6 11/12/2022   MCV 88.3 11/12/2022   PLT 213 11/12/2022      Chemistry      Component Value Date/Time   NA 138 11/12/2022 1003   K 4.3 11/12/2022 1003   CL 101 11/12/2022 1003   CO2 26 11/12/2022 1003   BUN 27 (H) 11/12/2022 1003   CREATININE 1.38 (H) 11/12/2022 1003      Component Value Date/Time   CALCIUM 10.8 (H) 11/12/2022 1003   CALCIUM 10.1 03/15/2018 1151   ALKPHOS 45 11/12/2022 1003   AST 21 11/12/2022 1003   ALT 19 11/12/2022 1003   BILITOT 0.8 11/12/2022 1003       Lab Results  Component Value Date   CA2729 15.4 05/12/2022   CA2729 17.5 11/11/2021   CA2729 18.2 05/09/2021   CA2729 22.9 11/04/2020   CA2729 15.7 05/01/2020   CA2729 25.6 11/29/2019     RADIOGRAPHIC STUDIES: I have personally reviewed the radiological images as listed and agreed with the findings in the  report. No results found.   ASSESSMENT & PLAN:  Carcinoma of overlapping sites of right breast in female, estrogen receptor positive ([place]) 1.   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 Pathology revealed 1.7 cm grade III invasive mammary carcinoma of now special type. 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 She underwent re-excision of margin. Persistent positive margin on re-excision with in situ carcinoma < 0.5 mm from margin. There was lymphovascular invasion. One SLN was negative.  No chemotherapy based on oncotype. She received radiation completed 03/03/18.  Stable.   # Started AI therapy 03/15/18. Mammogram from  FEB, 2024- was reviewed today, no radiographic evidence of disease. Clinically asymptomatic.  # Continue femara daily given er/pr positivity of her cancer for at least 5 years [until summer June, 2024].  Continue 6 month surveillance.   Again reviewed the oncotype- RS- 9-risk of recurrence < 3 % at 9 years.    2.   Osteopenia- .MARCH 2021- T score= -1.4. density scan was 12/18/2019. She continues weight bearing exercise. Labs reviewed and ok for prolia today. vit D levels; will BMD order today.  # CKD stage III-[pt aware with PCP] GFR 40 I monitor closely with PCP.  # Hypercalcemia- 10.8- but JULY 2023- vit D-25, OH- low. Recommend stopping vit D; and Ca. Will need follow up closely with PCP, Ms. Dayton Martes- NP-[facility] re: further work up.  Our staff will reach out to PCPs office with the recommendations.   Disposition: # Prolia today # 6 months (labs- cbc, cmp, ca27.29; 25-OH vit D leves), MD-possible  prolia; BMD-Dr.B     Orders Placed This Encounter  Procedures   CBC with Differential ([place] Only)    Standing Status:   Future    Standing Expiration Date:   11/13/2023   CMP ([place] only)    Standing Status:   Future    Standing Expiration Date:   11/13/2023   VITAMIN D 25 Hydroxy (Vit-D Deficiency,  Fractures)    Standing Status:   Future    Standing Expiration Date:   11/13/2023   Cancer antigen 27.29    Standing Status:   Future    Standing Expiration Date:   11/13/2023       Cammie Sickle, MD 11/12/2022 11:20 AM

## 2022-11-12 NOTE — Assessment & Plan Note (Addendum)
1.   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 Pathology revealed 1.7 cm grade III invasive mammary carcinoma of now special type. 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 She underwent re-excision of margin. Persistent positive margin on re-excision with in situ carcinoma < 0.5 mm from margin. There was lymphovascular invasion. One SLN was negative.  No chemotherapy based on oncotype. She received radiation completed 03/03/18.  Stable.   # Started AI therapy 03/15/18. Mammogram from  FEB, 2024- was reviewed today, no radiographic evidence of disease. Clinically asymptomatic.  # Continue femara daily given er/pr positivity of her cancer for at least 5 years [until summer June, 2024].  Continue 6 month surveillance.   Again reviewed the oncotype- RS- 9-risk of recurrence < 3 % at 11 years.    2.   Osteopenia- .MARCH 2021- T score= -1.4. density scan was 12/18/2019. She continues weight bearing exercise. Labs reviewed and ok for prolia today. vit D levels; will BMD order today.  # CKD stage III-[pt aware with PCP] GFR 40 I monitor closely with PCP.  # Hypercalcemia- 10.8- but JULY 2023- vit D-25, OH- low. Recommend stopping vit D; and Ca. Will need follow up closely with PCP, Ms. Dayton Martes- NP-[facility] re: further work up.  Our staff will reach out to PCPs office with the recommendations.   Disposition: # Prolia today # 6 months (labs- cbc, cmp, ca27.29; 25-OH vit D leves), MD-possible  prolia; BMD-Dr.B

## 2022-11-12 NOTE — Progress Notes (Signed)
Patient denies new problems/concerns today.   

## 2022-11-13 LAB — CANCER ANTIGEN 27.29: CA 27.29: 10.5 U/mL (ref 0.0–38.6)

## 2023-01-12 ENCOUNTER — Ambulatory Visit (INDEPENDENT_AMBULATORY_CARE_PROVIDER_SITE_OTHER): Payer: Medicare Other

## 2023-01-12 ENCOUNTER — Other Ambulatory Visit: Payer: Self-pay

## 2023-01-12 ENCOUNTER — Ambulatory Visit
Admission: EM | Admit: 2023-01-12 | Discharge: 2023-01-12 | Disposition: A | Discharge status: Home / Self Care | Payer: Medicare Other | Attending: Family | Admitting: Family

## 2023-01-12 DIAGNOSIS — J0100 Acute maxillary sinusitis, unspecified: Secondary | ICD-10-CM | POA: Diagnosis not present

## 2023-01-12 DIAGNOSIS — R062 Wheezing: Secondary | ICD-10-CM | POA: Diagnosis not present

## 2023-01-12 DIAGNOSIS — J209 Acute bronchitis, unspecified: Secondary | ICD-10-CM | POA: Diagnosis not present

## 2023-01-12 DIAGNOSIS — R051 Acute cough: Secondary | ICD-10-CM

## 2023-01-12 HISTORY — DX: Sleep apnea, unspecified: G47.30

## 2023-01-12 MED ORDER — AMOXICILLIN-POT CLAVULANATE 875-125 MG PO TABS: 1.0000 | ORAL_TABLET | Freq: Two times a day (BID) | ORAL | 0 refills | Status: AC

## 2023-01-12 MED ORDER — ALBUTEROL SULFATE HFA 108 (90 BASE) MCG/ACT IN AERS: 2.0000 | INHALATION_SPRAY | Freq: Four times a day (QID) | RESPIRATORY_TRACT | 0 refills | Status: DC | PRN

## 2023-01-12 NOTE — ED Provider Notes (Signed)
[facility]    CSN: [id number] Arrival date & time: 01/12/23  4098      History   Chief Complaint Chief Complaint  Patient presents with   Cough   Nasal Congestion    HPI Brianna Rogers is a 77 y.o. female.   77 year old female presents with nasal congestion, runny nose, irritated throat and cough for over 2 weeks. Thought she had a mild cold but 3 days ago, cough has gotten much worse with more chest soreness. No documented fever at home but low grade fever here today. Decreased appetite and fatigue. Denies any nausea or vomiting.   The history is provided by the patient.  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   OSA (obstructive sleep apnea) 06/20/2021   History of breast cancer 05/09/2021   Long term (current) use of aromatase inhibitors 05/09/2021   Carpal tunnel syndrome, right 07/22/2020   Hypercalcemia 03/20/2018   Osteopenia of neck of left femur 12/20/2017   Breast cancer, right 11/18/2017   Cardiac murmur 09/22/2015   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    BREAST  LUMPECTOMY Right 12/06/2017   invasive mammary carcinoma DCIS LCIS  with RAD   CARPAL TUNNEL RELEASE Right 08/13/2020   Procedure: CARPAL TUNNEL RELEASE ENDOSCOPIC WITH RELEASE OF RIGHT LONG TRIGGER FINGER;  Surgeon: Christena Flake, MD;  Location: [facility] ORS;  Service: Orthopedics;  Laterality: Right;   COLONOSCOPY     COLONOSCOPY WITH PROPOFOL N/A 11/15/2017   Procedure: COLONOSCOPY WITH PROPOFOL;  Surgeon: Christena Deem, MD;  Location: [facility] ENDOSCOPY;  Service: Endoscopy;  Laterality: N/A;   PARTIAL MASTECTOMY WITH NEEDLE LOCALIZATION Right 12/06/2017   Procedure: PARTIAL MASTECTOMY WITH NEEDLE LOCALIZATION;  Surgeon: Carolan Shiver, MD;  Location: [facility] ORS;  Service: General;  Laterality: Right;   SENTINEL NODE BIOPSY Right 12/06/2017   Procedure: SENTINEL NODE BIOPSY;  Surgeon: Carolan Shiver, MD;  Location: [facility] ORS;  Service: General;  Laterality: Right;   SEPTOPLASTY     TRIGGER FINGER RELEASE Right 12/03/2020   Procedure: RELEASE TRIGGER FINGER/A-1 PULLEY;  Surgeon: Christena Flake, MD;  Location: [facility] ORS;  Service: Orthopedics;  Laterality: Right;   TRIGGER FINGER RELEASE Left 02/11/2021   Procedure: RELEASE OF LEFT INDEX AND RING TRIGGER FINGERS AND ENDOSCOPIC LEFT CARPAL TUNNEL RELEASE;  Surgeon: Christena Flake, MD;  Location: [facility] ORS;  Service: Orthopedics;  Laterality: Left;   TUBAL LIGATION      OB History   No obstetric history on file.      Home Medications  Prior to Admission medications   Medication Sig Start Date End Date Taking? Authorizing Provider  acetaminophen (TYLENOL) 500 MG tablet Take 500 mg by mouth every 6 (six) hours as needed for moderate pain.    [provider]  amLODipine (NORVASC) 5 MG tablet Take 10 mg by mouth every morning. 10/01/20   [provider]  aspirin EC 81 MG tablet Take 81 mg by mouth every evening.     [provider]  B-D ULTRAFINE III SHORT PEN 31G X 8 MM MISC U UTD Patient not taking: Reported  on 11/12/2022 03/20/19   [provider]  calcium-vitamin D (OSCAL WITH D) 500-5 MG-MCG tablet Take 2 tablets by mouth 2 (two) times daily. 06/05/22 11/12/22  Rushie Chestnut, PA-C  Continuous Blood Gluc Sensor (FREESTYLE LIBRE SENSOR SYSTEM) MISC Use 3 each every 10 (ten) days 02/22/18   [provider]  cyanocobalamin (VITAMIN B12) 1000 MCG tablet Take 1,000 mcg by mouth daily.    [provider]  cyanocobalamin (VITAMIN B12) 1000 MCG/ML injection Inject 1,000 mcg into the muscle every 30 (thirty) days. 07/01/22 06/23/52  [provider]  denosumab (PROLIA) 60 MG/ML SOSY injection Inject 60 mg into the skin every 6 (six) months.    [provider]  fluorometholone (FML) 0.1 % ophthalmic suspension Place 1 drop into the left eye 4 (four) times daily. Patient not taking: Reported on 11/12/2022 11/06/21   [provider]  hydrochlorothiazide (MICROZIDE) 12.5 MG capsule Take 12.5 mg by mouth daily. 06/20/21   [provider]  insulin lispro (HUMALOG) 100 UNIT/ML injection Inject 11 Units into the skin 2 (two) times daily before a meal. 11 units at lunch and 9 units at dinner    [provider]  letrozole (FEMARA) 2.5 MG tablet TAKE 1 TABLET(2.5 MG) BY MOUTH DAILY 09/08/22   Alinda Dooms, NP  lisinopril (ZESTRIL) 20 MG tablet Take 20 mg by mouth 2 (two) times daily. 12/19/21   [provider]  meloxicam (MOBIC) 15 MG tablet Take 15 mg by mouth daily.    [provider]  metFORMIN (GLUCOPHAGE) 1000 MG tablet Take 1,000 mg by mouth 2 (two) times daily with a meal.    [provider]  Multiple Vitamin (MULTIVITAMIN) tablet Take 1 tablet by mouth daily.    [provider]  Omega-3 Fatty Acids (FISH OIL) 1200 MG CAPS Take 2,400 mg by mouth 2 (two) times daily.     [provider]  PREVIDENT 5000 DRY MOUTH 1.1 % GEL dental gel Place 1 application onto teeth at bedtime. 11/26/20   [provider]  rosuvastatin (CRESTOR) 5 MG tablet Take 5 mg by mouth every other day. In the evening    [provider]  TOUJEO MAX SOLOSTAR 300 UNIT/ML Solostar Pen 46 units in morning 04/19/22   [provider]  Vitamin D, Ergocalciferol, (DRISDOL) 1.25 MG (50000 UNIT) CAPS capsule Take 50,000 Units by mouth every 7 (seven) days. 10/17/22   [provider]    Family History Family History  Problem Relation Age of Onset   Breast cancer Mother 65   Cancer Mother    Cancer Maternal Aunt    Cancer Maternal Grandmother     Social History Social History   Tobacco Use   Smoking status: Never   Smokeless tobacco: Never  Vaping Use   Vaping Use: Never used  Substance Use Topics   Alcohol use: Not Currently    Comment: RAREly  Drug use: No     Allergies   Alendronate, Bactrim [sulfamethoxazole-trimethoprim], Empagliflozin, Macrodantin [nitrofurantoin macrocrystal], Sulfa antibiotics, and Latex   Review of Systems Review of Systems  Respiratory:  Positive for cough.      Physical Exam Triage Vital Signs ED Triage Vitals  Enc Vitals Group     BP 01/12/23 0825 (!) 161/81     Pulse Rate 01/12/23 0825 86     Resp 01/12/23 0825 (!) 22     Temp 01/12/23 0825 100 F (37.8 C)     Temp src --      SpO2 01/12/23 0825 95 %     Weight --      Height --      Head Circumference --      Peak Flow --      Pain Score 01/12/23 0826 0     Pain Loc --      Pain Edu? --      Excl. in GC? --    No data found.  Updated Vital Signs BP (!) 161/81   Pulse 86   Temp 100 F (37.8 C)   Resp (!) 22   SpO2 95%   Visual Acuity Right Eye Distance:   Left Eye Distance:   Bilateral Distance:    Right Eye Near:   Left Eye Near:    Bilateral Near:     Physical Exam HENT:     Head: Normocephalic and atraumatic.     Right Ear: Hearing, tympanic membrane, ear canal and external ear normal.     Left Ear: Hearing, tympanic membrane, ear canal and external ear normal.      Nose: Congestion present.     Right Sinus: Maxillary sinus tenderness present. No frontal sinus tenderness.     Left Sinus: Maxillary sinus tenderness present. No frontal sinus tenderness.     Mouth/Throat:     Lips: Pink.     Mouth: Mucous membranes are moist.     Pharynx: Uvula midline. Posterior oropharyngeal erythema present. No pharyngeal swelling or uvula swelling.  Pulmonary:     Effort: No respiratory distress.     Breath sounds: Normal air entry. No decreased air movement. Examination of the right-upper field reveals wheezing and rhonchi. Examination of the left-upper field reveals wheezing and rhonchi. Examination of the right-middle field reveals wheezing. Examination of the right-lower field reveals wheezing. Examination of the left-lower field reveals wheezing. Wheezing and rhonchi present. No decreased breath sounds.      UC Treatments / Results  Labs (all labs ordered are listed, but only abnormal results are displayed) Labs Reviewed - No data to display  EKG   Radiology DG Chest 2 View  Result Date: 01/12/2023 CLINICAL DATA:  Provided history: Cough and congestion for 2 weeks, worse in the past 3 days additional history provided: sneezing, nasal congestion. EXAM: CHEST - 2 VIEW COMPARISON:  None. FINDINGS: Heart size within normal limits. Aortic atherosclerosis. Minimal atelectasis or scarring within the left lung base. No appreciable airspace consolidation. No evidence of pleural effusion or pneumothorax. No acute osseous abnormality identified. Degenerative changes of the spine. IMPRESSION: 1. Minimal atelectasis or scarring within the left lung base. 2. Otherwise, no evidence of acute cardiopulmonary abnormality. 3.  Aortic Atherosclerosis (ICD10-I70.0). Electronically Signed   By: Jackey Loge D.O.   On: 01/12/2023 09:04    Procedures Procedures (including critical care time)  Medications Ordered in UC Medications - No data to display  Initial Impression /  Assessment and Plan /  UC Course  I have reviewed the triage vital signs and the nursing notes.  Pertinent labs & imaging results that were available during my care of the patient were reviewed by me and considered in my medical decision making (see chart for details).     *** Final Clinical Impressions(s) / UC Diagnoses   Final diagnoses:  None   Discharge Instructions   None    ED Prescriptions   None    PDMP not reviewed this encounter.

## 2023-01-12 NOTE — Discharge Instructions (Signed)
Recommend start Augmentin  twice a day for 7 days- take with food. May use Albuterol inhaler 2 puffs every 6 hours as needed for wheezing or cough. May continue OTC Mucinex every 12 hours as needed for cough. May take OTC Delsym 2 teaspoons every 12 hours as needed for cough. Rest. Continue to increase fluids. Follow-up in 3 to 4 days if not improving or sooner if worsening.

## 2023-01-12 NOTE — ED Notes (Signed)
X ray called for imaging.

## 2023-01-12 NOTE — ED Triage Notes (Addendum)
Pt c/o cough, sneezing, nasal congestion for approx 2 wks and denies fever

## 2023-03-06 ENCOUNTER — Other Ambulatory Visit: Payer: Self-pay | Admitting: Nurse Practitioner

## 2023-03-08 ENCOUNTER — Encounter: Payer: Self-pay | Admitting: Internal Medicine

## 2023-05-13 ENCOUNTER — Inpatient Hospital Stay: Payer: Medicare Other

## 2023-05-13 ENCOUNTER — Inpatient Hospital Stay: Payer: Medicare Other | Attending: Internal Medicine

## 2023-05-13 ENCOUNTER — Encounter: Payer: Self-pay | Admitting: Internal Medicine

## 2023-05-13 ENCOUNTER — Inpatient Hospital Stay (HOSPITAL_BASED_OUTPATIENT_CLINIC_OR_DEPARTMENT_OTHER): Payer: Medicare Other | Admitting: Internal Medicine

## 2023-05-13 VITALS — BP 174/67 | HR 75 | Temp 97.7°F | Ht 60.0 in | Wt 157.0 lb

## 2023-05-13 DIAGNOSIS — M8580 Other specified disorders of bone density and structure, unspecified site: Secondary | ICD-10-CM | POA: Diagnosis not present

## 2023-05-13 DIAGNOSIS — Z170 Estrogen receptor positive status [ER+]: Secondary | ICD-10-CM | POA: Insufficient documentation

## 2023-05-13 DIAGNOSIS — N183 Chronic kidney disease, stage 3 unspecified: Secondary | ICD-10-CM | POA: Insufficient documentation

## 2023-05-13 DIAGNOSIS — Z79811 Long term (current) use of aromatase inhibitors: Secondary | ICD-10-CM | POA: Insufficient documentation

## 2023-05-13 DIAGNOSIS — G4733 Obstructive sleep apnea (adult) (pediatric): Secondary | ICD-10-CM | POA: Insufficient documentation

## 2023-05-13 DIAGNOSIS — C50811 Malignant neoplasm of overlapping sites of right female breast: Secondary | ICD-10-CM

## 2023-05-13 DIAGNOSIS — Z923 Personal history of irradiation: Secondary | ICD-10-CM | POA: Insufficient documentation

## 2023-05-13 DIAGNOSIS — M85852 Other specified disorders of bone density and structure, left thigh: Secondary | ICD-10-CM

## 2023-05-13 LAB — CBC WITH DIFFERENTIAL (CANCER CENTER ONLY)
Abs Immature Granulocytes: 0.21 10*3/uL — ABNORMAL HIGH (ref 0.00–0.07)
Basophils Absolute: 0.1 10*3/uL (ref 0.0–0.1)
Basophils Relative: 0 %
Eosinophils Absolute: 0.3 10*3/uL (ref 0.0–0.5)
Eosinophils Relative: 2 %
HCT: 38.4 % (ref 36.0–46.0)
Hemoglobin: 12.7 g/dL (ref 12.0–15.0)
Immature Granulocytes: 2 %
Lymphocytes Relative: 16 %
Lymphs Abs: 2.3 10*3/uL (ref 0.7–4.0)
MCH: 29.3 pg (ref 26.0–34.0)
MCHC: 33.1 g/dL (ref 30.0–36.0)
MCV: 88.7 fL (ref 80.0–100.0)
Monocytes Absolute: 1.2 10*3/uL — ABNORMAL HIGH (ref 0.1–1.0)
Monocytes Relative: 8 %
Neutro Abs: 10.3 10*3/uL — ABNORMAL HIGH (ref 1.7–7.7)
Neutrophils Relative %: 72 %
Platelet Count: 279 10*3/uL (ref 150–400)
RBC: 4.33 MIL/uL (ref 3.87–5.11)
RDW: 13 % (ref 11.5–15.5)
WBC Count: 14.3 10*3/uL — ABNORMAL HIGH (ref 4.0–10.5)
nRBC: 0 % (ref 0.0–0.2)

## 2023-05-13 LAB — CMP (CANCER CENTER ONLY)
ALT: 16 U/L (ref 0–44)
AST: 14 U/L — ABNORMAL LOW (ref 15–41)
Albumin: 3.7 g/dL (ref 3.5–5.0)
Alkaline Phosphatase: 44 U/L (ref 38–126)
Anion gap: 9 (ref 5–15)
BUN: 49 mg/dL — ABNORMAL HIGH (ref 8–23)
CO2: 22 mmol/L (ref 22–32)
Calcium: 10.1 mg/dL (ref 8.9–10.3)
Chloride: 106 mmol/L (ref 98–111)
Creatinine: 1.36 mg/dL — ABNORMAL HIGH (ref 0.44–1.00)
GFR, Estimated: 41 mL/min — ABNORMAL LOW (ref >60)
Glucose, Bld: 183 mg/dL — ABNORMAL HIGH (ref 70–99)
Potassium: 4.5 mmol/L (ref 3.5–5.1)
Sodium: 137 mmol/L (ref 135–145)
Total Bilirubin: 0.5 mg/dL (ref 0.3–1.2)
Total Protein: 7.6 g/dL (ref 6.5–8.1)

## 2023-05-13 LAB — VITAMIN D 25 HYDROXY (VIT D DEFICIENCY, FRACTURES): Vit D, 25-Hydroxy: 43.69 ng/mL (ref 30–100)

## 2023-05-13 MED ORDER — DENOSUMAB 60 MG/ML ~~LOC~~ SOSY
60.0000 mg | PREFILLED_SYRINGE | Freq: Once | SUBCUTANEOUS | Status: AC
  Administered 2023-05-13: 60 mg via SUBCUTANEOUS
  Filled 2023-05-13: qty 1

## 2023-05-13 NOTE — Progress Notes (Signed)
Finished prednisone and robaxin yesterday for trapezius muscles.   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  Dx with chronic obstructive sleep apena, cpap.

## 2023-05-13 NOTE — Assessment & Plan Note (Addendum)
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 Pathology revealed 1.7 cm grade III invasive mammary carcinoma of now special type. 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 She underwent re-excision of margin. Persistent positive margin on re-excision with in situ carcinoma < 0.5 mm from margin. There was lymphovascular invasion. One SLN was negative.  No chemotherapy based on oncotype. S/-p radiation completed 03/03/18.  Started AI therapy 03/15/18. Mammogram from  FEB, 2024- was reviewed today, no radiographic evidence of disease. Clinically asymptomatic. STOP in aug 2024.   Again reviewed the oncotype- RS- 9-risk of recurrence < 3 % at 11 years.    #  Osteopenia- .MARCH 2021- T score= -1.4. density scan was 12/18/2019. She continues weight bearing exercise. Labs reviewed and ok for prolia today. vit D levels; will BMD order today.  # CKD stage III-[pt aware with PCP] GFR 40 I monitor closely with PCP.  # Diabetes [on insulin; GLP agonist; metformin]- improved. Stable.   # Hx of Hypercalcemia- 10 [s/p PCP]- stable.    Disposition: # Prolia today # 6 months MD (labs- cbc, cmp, ca27.29; 25-OH vit D levels), -possible  prolia; BMD/bil screening mammogram -Dr.B

## 2023-05-13 NOTE — Progress Notes (Signed)
[facility] OFFICE PROGRESS NOTE  Patient Care Team: Gauger, Hermenia Fiscal, NP as PCP - General (Internal Medicine) Mickey Farber, MD as Consulting Physician (Internal Medicine) Earna Coder, MD as Consulting Physician (Internal Medicine)  SUMMARY OF ONCOLOGIC HISTORY: Oncology History Overview Note  1.   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 Pathology revealed 1.7 cm grade III invasive mammary carcinoma of now special type. 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 She underwent re-excision of margin. Persistent positive margin on re-excision with in situ carcinoma < 0.5 mm from margin. There was lymphovascular invasion. One SLN was negative. No chemotherapy based on oncotype. She received radiation completed 03/03/18. Started AI therapy 03/15/18.    2.   Osteopenia- osteopenia present on dexa scan prior to cancer diagnosis or AI treatment. She receives prolia every 6 months. Last bone density scan was 12/18/2019.       Carcinoma of overlapping sites of right breast in female, estrogen receptor positive (HCC)  11/11/2021 Initial Diagnosis   Carcinoma of overlapping sites of right breast in female, estrogen receptor positive (HCC)    INTERVAL HISTORY: Alone.  Ambulating independently.  77 year old female patient with a history of stage I ER/PR positive breast cancer HER2 negative currently on letrozole is here for follow-up.  Patient recently finished prednisone and robaxin yesterday for trapezius muscles.      Recently Dx with chronic obstructive sleep apnea- currently on cpap.   Patient denies any new lumps or bumps.  Denies any chest pain or shortness of breath or cough.   Review of Systems  Constitutional:  Negative for chills, diaphoresis, fever, malaise/fatigue and weight loss.  HENT:  Negative for nosebleeds and sore throat.   Eyes:  Negative for double vision.  Respiratory:  Negative for  cough, hemoptysis, sputum production, shortness of breath and wheezing.   Cardiovascular:  Negative for chest pain, palpitations, orthopnea and leg swelling.  Gastrointestinal:  Negative for abdominal pain, blood in stool, constipation, diarrhea, heartburn, melena, nausea and vomiting.  Genitourinary:  Negative for dysuria, frequency and urgency.  Musculoskeletal:  Positive for joint pain and neck pain. Negative for back pain.  Skin: Negative.  Negative for itching and rash.  Neurological:  Negative for dizziness, tingling, focal weakness, weakness and headaches.  Endo/Heme/Allergies:  Does not bruise/bleed easily.  Psychiatric/Behavioral:  Negative for depression. The patient is not nervous/anxious and does not have insomnia.      ALLERGIES:  is allergic to alendronate, bactrim [sulfamethoxazole-trimethoprim], empagliflozin, macrodantin [nitrofurantoin macrocrystal], sulfa antibiotics, and latex.  MEDICATIONS:  Current Outpatient Medications  Medication Sig Dispense Refill   acetaminophen (TYLENOL) 500 MG tablet Take 500 mg by mouth every 6 (six) hours as needed for moderate pain.     albuterol (VENTOLIN HFA) 108 (90 Base) MCG/ACT inhaler Inhale 2 puffs into the lungs every 6 (six) hours as needed for wheezing (or cough). 18 g 0   amLODipine (NORVASC) 5 MG tablet Take 10 mg by mouth every morning.     aspirin EC 81 MG tablet Take 81 mg by mouth every evening.      Continuous Blood Gluc Sensor (FREESTYLE LIBRE SENSOR SYSTEM) MISC Use 3 each every 10 (ten) days     cyanocobalamin (VITAMIN B12) 1000 MCG tablet Take 1,000 mcg by mouth daily.     cyanocobalamin (VITAMIN B12) 1000 MCG/ML injection Inject 1,000 mcg into the muscle every 30 (thirty) days.     denosumab (PROLIA) 60 MG/ML  SOSY injection Inject 60 mg into the skin every 6 (six) months.     fluorometholone (FML) 0.1 % ophthalmic suspension Place 1 drop into the left eye 4 (four) times daily.     hydrochlorothiazide (MICROZIDE) 12.5 MG  capsule Take 12.5 mg by mouth daily.     insulin lispro (HUMALOG) 100 UNIT/ML injection Inject 11 Units into the skin 2 (two) times daily before a meal. 11 units at lunch and 9 units at dinner     letrozole (FEMARA) 2.5 MG tablet TAKE 1 TABLET(2.5 MG) BY MOUTH DAILY 90 tablet 1   lisinopril (ZESTRIL) 20 MG tablet Take 20 mg by mouth 2 (two) times daily.     meloxicam (MOBIC) 15 MG tablet Take 15 mg by mouth daily.     metFORMIN (GLUCOPHAGE) 1000 MG tablet Take 1,000 mg by mouth 2 (two) times daily with a meal.     Multiple Vitamin (MULTIVITAMIN) tablet Take 1 tablet by mouth daily.     Omega-3 Fatty Acids (FISH OIL) 1200 MG CAPS Take 2,400 mg by mouth 2 (two) times daily.      PREVIDENT 5000 DRY MOUTH 1.1 % GEL dental gel Place 1 application onto teeth at bedtime.     rosuvastatin (CRESTOR) 5 MG tablet Take 5 mg by mouth every other day. In the evening     TOUJEO MAX SOLOSTAR 300 UNIT/ML Solostar Pen 46 units in morning     Vitamin D, Ergocalciferol, (DRISDOL) 1.25 MG (50000 UNIT) CAPS capsule Take 50,000 Units by mouth every 7 (seven) days.     B-D ULTRAFINE III SHORT PEN 31G X 8 MM MISC U UTD (Patient not taking: Reported on 11/12/2022)     calcium-vitamin D (OSCAL WITH D) 500-5 MG-MCG tablet Take 2 tablets by mouth 2 (two) times daily. 120 tablet 2   No current facility-administered medications for this visit.    PHYSICAL EXAMINATION: ECOG PERFORMANCE STATUS: 0 - Asymptomatic  Vitals:   05/13/23 1008  BP: (!) 174/67  Pulse: 75  Temp: 97.7 F (36.5 C)  SpO2: 100%   Filed Weights   05/13/23 1008  Weight: 157 lb (71.2 kg)    Physical Exam Vitals and nursing note reviewed.  HENT:     Head: Normocephalic and atraumatic.     Mouth/Throat:     Pharynx: Oropharynx is clear.  Eyes:     Extraocular Movements: Extraocular movements intact.     Pupils: Pupils are equal, round, and reactive to light.  Cardiovascular:     Rate and Rhythm: Normal rate and regular rhythm.  Pulmonary:      Comments: Decreased breath sounds bilaterally.  Abdominal:     Palpations: Abdomen is soft.  Musculoskeletal:        General: Normal range of motion.     Cervical back: Normal range of motion.  Skin:    General: Skin is warm.  Neurological:     General: No focal deficit present.     Mental Status: She is alert and oriented to person, place, and time.  Psychiatric:        Behavior: Behavior normal.        Judgment: Judgment normal.     LABORATORY DATA:  I have reviewed the data as listed    Component Value Date/Time   NA 137 05/13/2023 1005   K 4.5 05/13/2023 1005   CL 106 05/13/2023 1005   CO2 22 05/13/2023 1005   GLUCOSE 183 (H) 05/13/2023 1005   BUN 49 (H) 05/13/2023  1005   CREATININE 1.36 (H) 05/13/2023 1005   CALCIUM 10.1 05/13/2023 1005   CALCIUM 10.1 03/15/2018 1151   PROT 7.6 05/13/2023 1005   ALBUMIN 3.7 05/13/2023 1005   AST 14 (L) 05/13/2023 1005   ALT 16 05/13/2023 1005   ALKPHOS 44 05/13/2023 1005   BILITOT 0.5 05/13/2023 1005   GFRNONAA 41 (L) 05/13/2023 1005   GFRAA >60 05/01/2020 1339    No results found for: "SPEP", "UPEP"  Lab Results  Component Value Date   WBC 14.3 (H) 05/13/2023   NEUTROABS 10.3 (H) 05/13/2023   HGB 12.7 05/13/2023   HCT 38.4 05/13/2023   MCV 88.7 05/13/2023   PLT 279 05/13/2023      Chemistry      Component Value Date/Time   NA 137 05/13/2023 1005   K 4.5 05/13/2023 1005   CL 106 05/13/2023 1005   CO2 22 05/13/2023 1005   BUN 49 (H) 05/13/2023 1005   CREATININE 1.36 (H) 05/13/2023 1005      Component Value Date/Time   CALCIUM 10.1 05/13/2023 1005   CALCIUM 10.1 03/15/2018 1151   ALKPHOS 44 05/13/2023 1005   AST 14 (L) 05/13/2023 1005   ALT 16 05/13/2023 1005   BILITOT 0.5 05/13/2023 1005       Lab Results  Component Value Date   CA2729 10.5 11/12/2022   CA2729 15.4 05/12/2022   CA2729 17.5 11/11/2021   CA2729 18.2 05/09/2021   CA2729 22.9 11/04/2020   CA2729 15.7 05/01/2020     RADIOGRAPHIC  STUDIES: I have personally reviewed the radiological images as listed and agreed with the findings in the report. No results found.   ASSESSMENT & PLAN:  Carcinoma of overlapping sites of right breast in female, estrogen receptor positive (HCC) 1.   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 Pathology revealed 1.7 cm grade III invasive mammary carcinoma of now special type. 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 She underwent re-excision of margin. Persistent positive margin on re-excision with in situ carcinoma < 0.5 mm from margin. There was lymphovascular invasion. One SLN was negative.  No chemotherapy based on oncotype. She received radiation completed 03/03/18.  Stable.   # Started AI therapy 03/15/18. Mammogram from  FEB, 2024- was reviewed today, no radiographic evidence of disease. Clinically asymptomatic.  # Continue femara daily given er/pr positivity of her cancer for at least 5 years [until summer June, 2024].  Continue 6 month surveillance.   Again reviewed the oncotype- RS- 9-risk of recurrence < 3 % at 9 years.    2.   Osteopenia- .MARCH 2021- T score= -1.4. density scan was 12/18/2019. She continues weight bearing exercise. Labs reviewed and ok for prolia today. vit D levels; will BMD order today.  # CKD stage III-[pt aware with PCP] GFR 40 I monitor closely with PCP.  # Hypercalcemia- 10.8- but JULY 2023- vit D-25, OH- low. Recommend stopping vit D; and Ca. Will need follow up closely with PCP, Ms. Bayard Males- NP-[facility] re: further work up.  Our staff will reach out to PCPs office with the recommendations.   Disposition: # Prolia today # 6 months (labs- cbc, cmp, ca27.29; 25-OH vit D leves), MD-possible  prolia; BMD-Dr.B     No orders of the defined types were placed in this encounter.      Earna Coder, MD 05/13/2023 10:40 AM

## 2023-05-14 LAB — CANCER ANTIGEN 27.29: CA 27.29: 15.4 U/mL (ref 0.0–38.6)

## 2023-07-01 ENCOUNTER — Other Ambulatory Visit: Payer: Self-pay | Admitting: Nurse Practitioner

## 2023-07-01 DIAGNOSIS — M542 Cervicalgia: Secondary | ICD-10-CM

## 2023-07-01 DIAGNOSIS — M4722 Other spondylosis with radiculopathy, cervical region: Secondary | ICD-10-CM

## 2023-07-01 DIAGNOSIS — R200 Anesthesia of skin: Secondary | ICD-10-CM

## 2023-07-14 ENCOUNTER — Encounter: Payer: Self-pay | Admitting: Nurse Practitioner

## 2023-07-14 ENCOUNTER — Encounter: Payer: Self-pay | Admitting: Internal Medicine

## 2023-07-19 ENCOUNTER — Ambulatory Visit
Admission: RE | Admit: 2023-07-19 | Discharge: 2023-07-19 | Disposition: A | Discharge status: Home / Self Care | Payer: Medicare Other | Source: Ambulatory Visit | Attending: Nurse Practitioner | Admitting: Nurse Practitioner

## 2023-07-19 ENCOUNTER — Encounter: Payer: Self-pay | Admitting: *Deleted

## 2023-07-19 DIAGNOSIS — R200 Anesthesia of skin: Secondary | ICD-10-CM

## 2023-07-19 DIAGNOSIS — M542 Cervicalgia: Secondary | ICD-10-CM

## 2023-07-19 DIAGNOSIS — M4722 Other spondylosis with radiculopathy, cervical region: Secondary | ICD-10-CM

## 2023-07-30 ENCOUNTER — Encounter: Payer: Self-pay | Admitting: *Deleted

## 2023-07-30 ENCOUNTER — Ambulatory Visit: Payer: Medicare Other | Admitting: Certified Registered Nurse Anesthetist

## 2023-07-30 ENCOUNTER — Ambulatory Visit
Admission: RE | Admit: 2023-07-30 | Discharge: 2023-07-30 | Disposition: A | Discharge status: Home / Self Care | Payer: Medicare Other | Attending: Gastroenterology | Admitting: Gastroenterology

## 2023-07-30 ENCOUNTER — Encounter
Admission: RE | Disposition: A | Discharge status: Home / Self Care | Payer: Self-pay | Source: Home / Self Care | Attending: Gastroenterology

## 2023-07-30 DIAGNOSIS — I129 Hypertensive chronic kidney disease with stage 1 through stage 4 chronic kidney disease, or unspecified chronic kidney disease: Secondary | ICD-10-CM | POA: Diagnosis not present

## 2023-07-30 DIAGNOSIS — K640 First degree hemorrhoids: Secondary | ICD-10-CM | POA: Insufficient documentation

## 2023-07-30 DIAGNOSIS — K5730 Diverticulosis of large intestine without perforation or abscess without bleeding: Secondary | ICD-10-CM | POA: Diagnosis not present

## 2023-07-30 DIAGNOSIS — N189 Chronic kidney disease, unspecified: Secondary | ICD-10-CM | POA: Insufficient documentation

## 2023-07-30 DIAGNOSIS — Z90710 Acquired absence of both cervix and uterus: Secondary | ICD-10-CM | POA: Diagnosis not present

## 2023-07-30 DIAGNOSIS — G4730 Sleep apnea, unspecified: Secondary | ICD-10-CM | POA: Diagnosis not present

## 2023-07-30 DIAGNOSIS — Z860101 Personal history of adenomatous and serrated colon polyps: Secondary | ICD-10-CM | POA: Insufficient documentation

## 2023-07-30 DIAGNOSIS — Z1211 Encounter for screening for malignant neoplasm of colon: Secondary | ICD-10-CM | POA: Diagnosis present

## 2023-07-30 DIAGNOSIS — E1122 Type 2 diabetes mellitus with diabetic chronic kidney disease: Secondary | ICD-10-CM | POA: Insufficient documentation

## 2023-07-30 HISTORY — PX: COLONOSCOPY WITH PROPOFOL: SHX5780

## 2023-07-30 LAB — GLUCOSE, CAPILLARY
Glucose-Capillary: 181 mg/dL — ABNORMAL HIGH (ref 70–99)
Glucose-Capillary: 189 mg/dL — ABNORMAL HIGH (ref 70–99)

## 2023-07-30 SURGERY — COLONOSCOPY WITH PROPOFOL
Anesthesia: General

## 2023-07-30 MED ORDER — PROPOFOL 10 MG/ML IV BOLUS
INTRAVENOUS | Status: DC | PRN
  Administered 2023-07-30: 60 mg via INTRAVENOUS

## 2023-07-30 MED ORDER — PROPOFOL 500 MG/50ML IV EMUL
INTRAVENOUS | Status: DC | PRN
  Administered 2023-07-30: 170 ug/kg/min via INTRAVENOUS

## 2023-07-30 MED ORDER — LIDOCAINE HCL (PF) 2 % IJ SOLN
INTRAMUSCULAR | Status: AC
Start: 2023-07-30 — End: ?
  Filled 2023-07-30: qty 10

## 2023-07-30 MED ORDER — GLYCOPYRROLATE 0.2 MG/ML IJ SOLN
INTRAMUSCULAR | Status: AC
Start: 2023-07-30 — End: ?
  Filled 2023-07-30: qty 1

## 2023-07-30 MED ORDER — EPHEDRINE 5 MG/ML INJ
INTRAVENOUS | Status: AC
  Filled 2023-07-30: qty 5

## 2023-07-30 MED ORDER — EPHEDRINE SULFATE-NACL 50-0.9 MG/10ML-% IV SOSY
PREFILLED_SYRINGE | INTRAVENOUS | Status: DC | PRN
  Administered 2023-07-30: 10 mg via INTRAVENOUS

## 2023-07-30 MED ORDER — SODIUM CHLORIDE 0.9 % IV SOLN: INTRAVENOUS | Status: DC

## 2023-07-30 MED ORDER — PROPOFOL 1000 MG/100ML IV EMUL
INTRAVENOUS | Status: AC
Start: 2023-07-30 — End: ?
  Filled 2023-07-30: qty 300

## 2023-07-30 NOTE — Anesthesia Preprocedure Evaluation (Addendum)
Anesthesia Evaluation  Patient identified by MRN, date of birth, ID band Patient awake    Reviewed: Allergy & Precautions, NPO status , Patient's Chart, lab work & pertinent test results  History of Anesthesia Complications (+) PONV and history of anesthetic complications  Airway Mallampati: IV  TM Distance: >3 FB Neck ROM: full    Dental no notable dental hx.    Pulmonary sleep apnea and Continuous Positive Airway Pressure Ventilation    Pulmonary exam normal        Cardiovascular hypertension, On Medications Normal cardiovascular exam     Neuro/Psych  Neuromuscular disease  negative psych ROS   GI/Hepatic negative GI ROS, Neg liver ROS,,,  Endo/Other  diabetes    Renal/GU Renal disease  negative genitourinary   Musculoskeletal   Abdominal   Peds  Hematology negative hematology ROS (+)   Anesthesia Other Findings Past Medical History: No date: Breast cancer (HCC) 11/18/2017: Breast cancer, right (HCC) No date: Chronic kidney disease No date: Complication of anesthesia No date: Diabetes mellitus without complication (HCC) No date: Heart murmur No date: Hypertension No date: Lichen sclerosus et atrophicus No date: Personal history of radiation therapy No date: PONV (postoperative nausea and vomiting) No date: Sleep apnea  Past Surgical History: No date: ABDOMINAL HYSTERECTOMY No date: APPENDECTOMY 11/18/2017: BREAST BIOPSY; Right     Comment:  INVASIVE MAMMARY CARCINOMA.  12/06/2017: BREAST LUMPECTOMY; Right     Comment:  invasive mammary carcinoma DCIS LCIS  with RAD 08/13/2020: CARPAL TUNNEL RELEASE; Right     Comment:  Procedure: CARPAL TUNNEL RELEASE ENDOSCOPIC WITH RELEASE              OF RIGHT LONG TRIGGER FINGER;  Surgeon: Christena Flake,               MD;  Location: [facility] ORS;  Service: Orthopedics;                Laterality: Right; No date: COLONOSCOPY 11/15/2017: COLONOSCOPY WITH PROPOFOL;  N/A     Comment:  Procedure: COLONOSCOPY WITH PROPOFOL;  Surgeon:               Christena Deem, MD;  Location: [facility] ENDOSCOPY;                Service: Endoscopy;  Laterality: N/A; 12/06/2017: PARTIAL MASTECTOMY WITH NEEDLE LOCALIZATION; Right     Comment:  Procedure: PARTIAL MASTECTOMY WITH NEEDLE LOCALIZATION;               Surgeon: Carolan Shiver, MD;  Location: [facility] ORS;               Service: General;  Laterality: Right; 12/06/2017: SENTINEL NODE BIOPSY; Right     Comment:  Procedure: SENTINEL NODE BIOPSY;  Surgeon: Carolan Shiver, MD;  Location: [facility] ORS;  Service: General;                Laterality: Right; No date: SEPTOPLASTY 12/03/2020: TRIGGER FINGER RELEASE; Right     Comment:  Procedure: RELEASE TRIGGER FINGER/A-1 PULLEY;  Surgeon:               Christena Flake, MD;  Location: [facility] ORS;  Service:               Orthopedics;  Laterality: Right; 02/11/2021: TRIGGER FINGER RELEASE; Left     Comment:  Procedure: RELEASE OF LEFT INDEX AND RING TRIGGER  FINGERS AND ENDOSCOPIC LEFT CARPAL TUNNEL RELEASE;                Surgeon: Christena Flake, MD;  Location: [facility] ORS;                Service: Orthopedics;  Laterality: Left; No date: TUBAL LIGATION     Reproductive/Obstetrics negative OB ROS                             Anesthesia Physical Anesthesia Plan  ASA: 3  Anesthesia Plan: General   Post-op Pain Management: Minimal or no pain anticipated   Induction: Intravenous  PONV Risk Score and Plan: 2 and Propofol infusion and TIVA  Airway Management Planned: Natural Airway and Nasal Cannula  Additional Equipment:   Intra-op Plan:   Post-operative Plan:   Informed Consent: I have reviewed the patients History and Physical, chart, labs and discussed the procedure including the risks, benefits and alternatives for the proposed anesthesia with the patient or authorized representative who has indicated his/her  understanding and acceptance.     Dental Advisory Given  Plan Discussed with: Anesthesiologist, CRNA and Surgeon  Anesthesia Plan Comments: (Patient consented for risks of anesthesia including but not limited to:  - adverse reactions to medications - risk of airway placement if required - damage to eyes, teeth, lips or other oral mucosa - nerve damage due to positioning  - sore throat or hoarseness - Damage to heart, brain, nerves, lungs, other parts of body or loss of life  Patient voiced understanding and assent.)       Anesthesia Quick Evaluation

## 2023-07-30 NOTE — Op Note (Signed)
[facility] Gastroenterology Patient Name: Brianna Rogers Procedure Date: 07/30/2023 7:16 AM MRN: [id number] Account #: [id number] Date of Birth: 09/08/1946 Admit Type: Outpatient Age: 77 Room: [facility] ENDO ROOM 3 Gender: Female Note Status: Finalized Instrument Name: Prentice Docker [id number] Procedure:             Colonoscopy Indications:           Surveillance: Personal history of adenomatous polyps                         on last colonoscopy 5 years ago Providers:             Eather Colas MD, MD Referring MD:          Caryl Asp (Referring MD) Medicines:             Monitored Anesthesia Care Complications:         No immediate complications. Procedure:             Pre-Anesthesia Assessment:                        - Prior to the procedure, a History and Physical was                         performed, and patient medications and allergies were                         reviewed. The patient is competent. The risks and                         benefits of the procedure and the sedation options and                         risks were discussed with the patient. All questions                         were answered and informed consent was obtained.                         Patient identification and proposed procedure were                         verified by the physician, the nurse, the                         anesthesiologist, the anesthetist and the technician                         in the endoscopy suite. Mental Status Examination:                         alert and oriented. Airway Examination: normal                         oropharyngeal airway and neck mobility. Respiratory                         Examination: clear to auscultation. CV Examination:  normal. Prophylactic Antibiotics: The patient does not                         require prophylactic antibiotics. Prior                         Anticoagulants: The patient has taken no anticoagulant                          or antiplatelet agents. ASA Grade Assessment: III - A                         patient with severe systemic disease. After reviewing                         the risks and benefits, the patient was deemed in                         satisfactory condition to undergo the procedure. The                         anesthesia plan was to use monitored anesthesia care                         (MAC). Immediately prior to administration of                         medications, the patient was re-assessed for adequacy                         to receive sedatives. The heart rate, respiratory                         rate, oxygen saturations, blood pressure, adequacy of                         pulmonary ventilation, and response to care were                         monitored throughout the procedure. The physical                         status of the patient was re-assessed after the                         procedure.                        After obtaining informed consent, the colonoscope was                         passed under direct vision. Throughout the procedure,                         the patient's blood pressure, pulse, and oxygen                         saturations were monitored continuously. The  Colonoscope was introduced through the anus and                         advanced to the the cecum, identified by appendiceal                         orifice and ileocecal valve. The colonoscopy was                         somewhat difficult due to a redundant colon. The                         patient tolerated the procedure well. The quality of                         the bowel preparation was good. The ileocecal valve,                         appendiceal orifice, and rectum were photographed. Findings:      The perianal and digital rectal examinations were normal.      Multiple large-mouthed and small-mouthed diverticula were found in the       sigmoid  colon, descending colon and transverse colon.      Internal hemorrhoids were found during retroflexion. The hemorrhoids       were Grade I (internal hemorrhoids that do not prolapse).      The exam was otherwise without abnormality on direct and retroflexion       views. Impression:            - Diverticulosis in the sigmoid colon, in the                         descending colon and in the transverse colon.                        - Internal hemorrhoids.                        - The examination was otherwise normal on direct and                         retroflexion views.                        - No specimens collected. Recommendation:        - Discharge patient to home.                        - Resume previous diet.                        - Continue present medications.                        - Repeat colonoscopy is not recommended due to current                         age (20 years or older) for surveillance.                        -  Return to referring physician as previously                         scheduled. Procedure Code(s):     --- Professional ---                        W2956, Colorectal cancer screening; colonoscopy on                         individual at high risk Diagnosis Code(s):     --- Professional ---                        Z86.010, Personal history of colonic polyps                        K64.0, First degree hemorrhoids                        K57.30, Diverticulosis of large intestine without                         perforation or abscess without bleeding CPT copyright 2022 American Medical Association. All rights reserved. The codes documented in this report are preliminary and upon coder review may  be revised to meet current compliance requirements. Eather Colas MD, MD 07/30/2023 8:34:04 AM Number of Addenda: 0 Note Initiated On: 07/30/2023 7:16 AM Scope Withdrawal Time: 0 hours 6 minutes 18 seconds  Total Procedure Duration: 0 hours 20 minutes 49 seconds   Estimated Blood Loss:  Estimated blood loss: none.      [facility]

## 2023-07-30 NOTE — Interval H&P Note (Signed)
History and Physical Interval Note:  07/30/2023 8:03 AM  Brianna Rogers  has presented today for surgery, with the diagnosis of HX OF ADENOMATOUS POLYP OF COLON.  The various methods of treatment have been discussed with the patient and family. After consideration of risks, benefits and other options for treatment, the patient has consented to  Procedure(s): COLONOSCOPY WITH PROPOFOL (N/A) as a surgical intervention.  The patient's history has been reviewed, patient examined, no change in status, stable for surgery.  I have reviewed the patient's chart and labs.  Questions were answered to the patient's satisfaction.     Regis Bill  Ok to proceed with colonoscopy

## 2023-07-30 NOTE — Anesthesia Postprocedure Evaluation (Signed)
Anesthesia Post Note  Patient: Mcclain Czepiel  Procedure(s) Performed: COLONOSCOPY WITH PROPOFOL  Patient location during evaluation: Endoscopy Anesthesia Type: General Level of consciousness: awake and alert Pain management: pain level controlled Vital Signs Assessment: post-procedure vital signs reviewed and stable Respiratory status: spontaneous breathing, nonlabored ventilation, respiratory function stable and patient connected to nasal cannula oxygen Cardiovascular status: blood pressure returned to baseline and stable Postop Assessment: no apparent nausea or vomiting Anesthetic complications: no   No notable events documented.   Last Vitals:  Vitals:   07/30/23 0726 07/30/23 0833  BP: (!) 182/68 (!) 156/56  Pulse: 72 77  Resp: 18 (!) 21  Temp: 36.6 C (!) 36.1 C  SpO2: 100% 100%    Last Pain:  Vitals:   07/30/23 0833  TempSrc: Temporal  PainSc: Asleep                 Louie Boston

## 2023-07-30 NOTE — Anesthesia Procedure Notes (Signed)
Date/Time: 07/30/2023 8:31 AM  Performed by: Malva Cogan, CRNAPre-anesthesia Checklist: Patient identified, Emergency Drugs available, Suction available, Patient being monitored and Timeout performed Patient Re-evaluated:Patient Re-evaluated prior to induction Oxygen Delivery Method: Nasal cannula Induction Type: IV induction Ventilation: Oral airway inserted - appropriate to patient size Placement Confirmation: CO2 detector and positive ETCO2

## 2023-07-30 NOTE — H&P (Signed)
Outpatient short stay form Pre-procedure 07/30/2023  Regis Bill, MD  Primary Physician: Myrene Buddy, NP  Reason for visit:  Surveillance  History of present illness:    77 y/o lady with history of hypertension  and DM II here for surveillance colonoscopy. Last  colonoscopy in 2019 with small TA. History of hysterectomy. No blood thinners. No family history of GI malignancies.    Current Facility-Administered Medications:    0.9 %  sodium chloride infusion, , Intravenous, Continuous, Chaos Carlile, Rossie Muskrat, MD, Last Rate: 20 mL/hr at 07/30/23 0752, New Bag at 07/30/23 0752  Medications Prior to Admission  Medication Sig Dispense Refill Last Dose   amLODipine (NORVASC) 5 MG tablet Take 10 mg by mouth every morning.   07/30/2023 at 0500   aspirin EC 81 MG tablet Take 81 mg by mouth every evening.    Past Week   Continuous Blood Gluc Sensor (FREESTYLE LIBRE SENSOR SYSTEM) MISC Use 3 each every 10 (ten) days   07/30/2023   cyanocobalamin (VITAMIN B12) 1000 MCG tablet Take 1,000 mcg by mouth daily.   Past Week   cyanocobalamin (VITAMIN B12) 1000 MCG/ML injection Inject 1,000 mcg into the muscle every 30 (thirty) days.   Past Week   hydrochlorothiazide (MICROZIDE) 12.5 MG capsule Take 12.5 mg by mouth daily.   07/30/2023 at 0500   insulin lispro (HUMALOG) 100 UNIT/ML injection Inject 11 Units into the skin 2 (two) times daily before a meal. 11 units at lunch and 9 units at dinner   07/29/2023   lisinopril (ZESTRIL) 20 MG tablet Take 20 mg by mouth 2 (two) times daily.   07/30/2023 at 0500   meloxicam (MOBIC) 15 MG tablet Take 15 mg by mouth daily.   07/29/2023   metFORMIN (GLUCOPHAGE) 1000 MG tablet Take 1,000 mg by mouth 2 (two) times daily with a meal.   07/29/2023   Multiple Vitamin (MULTIVITAMIN) tablet Take 1 tablet by mouth daily.   Past Week   Omega-3 Fatty Acids (FISH OIL) 1200 MG CAPS Take 2,400 mg by mouth 2 (two) times daily.    Past Week   rosuvastatin (CRESTOR) 5 MG tablet  Take 5 mg by mouth every other day. In the evening   07/29/2023   TOUJEO MAX SOLOSTAR 300 UNIT/ML Solostar Pen 46 units in morning   07/29/2023   Vitamin D, Ergocalciferol, (DRISDOL) 1.25 MG (50000 UNIT) CAPS capsule Take 50,000 Units by mouth every 7 (seven) days.   Past Week   acetaminophen (TYLENOL) 500 MG tablet Take 500 mg by mouth every 6 (six) hours as needed for moderate pain.      albuterol (VENTOLIN HFA) 108 (90 Base) MCG/ACT inhaler Inhale 2 puffs into the lungs every 6 (six) hours as needed for wheezing (or cough). 18 g 0  at prn   B-D ULTRAFINE III SHORT PEN 31G X 8 MM MISC U UTD (Patient not taking: Reported on 11/12/2022)      calcium-vitamin D (OSCAL WITH D) 500-5 MG-MCG tablet Take 2 tablets by mouth 2 (two) times daily. 120 tablet 2    denosumab (PROLIA) 60 MG/ML SOSY injection Inject 60 mg into the skin every 6 (six) months.    at 6 months   fluorometholone (FML) 0.1 % ophthalmic suspension Place 1 drop into the left eye 4 (four) times daily. (Patient not taking: Reported on 07/30/2023)   Completed Course   letrozole (FEMARA) 2.5 MG tablet TAKE 1 TABLET(2.5 MG) BY MOUTH DAILY (Patient not taking: Reported on 07/30/2023)  90 tablet 1 Completed Course   PREVIDENT 5000 DRY MOUTH 1.1 % GEL dental gel Place 1 application onto teeth at bedtime.        Allergies  Allergen Reactions   Alendronate Itching   Bactrim [Sulfamethoxazole-Trimethoprim] Nausea And Vomiting   Empagliflozin Itching    And constant UTIs   Macrodantin [Nitrofurantoin Macrocrystal] Other (See Comments)    Flu like symptoms    Sulfa Antibiotics Other (See Comments)    Flu like symptoms   Latex Itching     Past Medical History:  Diagnosis Date   Breast cancer (HCC)    Breast cancer, right (HCC) 11/18/2017   Chronic kidney disease    Complication of anesthesia    Diabetes mellitus without complication (HCC)    Heart murmur    Hypertension    Lichen sclerosus et atrophicus    Personal history of radiation  therapy    PONV (postoperative nausea and vomiting)    Sleep apnea     Review of systems:  Otherwise negative.    Physical Exam  Gen: Alert, oriented. Appears stated age.  HEENT: PERRLA. Lungs: No respiratory distress CV: RRR Abd: soft, benign, no masses Ext: No edema    Planned procedures: Proceed with colonoscopy. The patient understands the nature of the planned procedure, indications, risks, alternatives and potential complications including but not limited to bleeding, infection, perforation, damage to internal organs and possible oversedation/side effects from anesthesia. The patient agrees and gives consent to proceed.  Please refer to procedure notes for findings, recommendations and patient disposition/instructions.     Regis Bill, MD [facility]

## 2023-07-30 NOTE — Transfer of Care (Signed)
Immediate Anesthesia Transfer of Care Note  Patient: Brianna Rogers  Procedure(s) Performed: COLONOSCOPY WITH PROPOFOL  Patient Location: PACU  Anesthesia Type:General  Level of Consciousness: drowsy  Airway & Oxygen Therapy: Patient spontaneously breathing  Post-op Assessment: Report given to RN and Post -op Vital signs reviewed and stable  Post vital signs: Reviewed and stable  Last Vitals:  Vitals Value Taken Time  BP 156/56 07/30/23 0834  Temp 36.1 C 07/30/23 0833  Pulse 74 07/30/23 0836  Resp 21 07/30/23 0833  SpO2 99 % 07/30/23 0836  Vitals shown include unfiled device data.  Last Pain:  Vitals:   07/30/23 0833  TempSrc: Temporal  PainSc: Asleep      Patients Stated Pain Goal: 0 (07/30/23 0726)  Complications: No notable events documented.

## 2023-08-02 ENCOUNTER — Encounter: Payer: Self-pay | Admitting: Gastroenterology

## 2023-09-08 ENCOUNTER — Other Ambulatory Visit: Payer: Self-pay | Admitting: Nephrology

## 2023-09-08 DIAGNOSIS — N1832 Chronic kidney disease, stage 3b: Secondary | ICD-10-CM

## 2023-09-13 ENCOUNTER — Ambulatory Visit
Admission: RE | Admit: 2023-09-13 | Discharge: 2023-09-13 | Disposition: A | Discharge status: Home / Self Care | Payer: Medicare Other | Source: Ambulatory Visit | Attending: Nephrology | Admitting: Nephrology

## 2023-09-13 DIAGNOSIS — N1832 Chronic kidney disease, stage 3b: Secondary | ICD-10-CM | POA: Insufficient documentation

## 2023-11-11 ENCOUNTER — Inpatient Hospital Stay: Admission: RE | Admit: 2023-11-11 | Payer: Medicare Other | Source: Ambulatory Visit

## 2023-11-11 ENCOUNTER — Other Ambulatory Visit: Payer: Medicare Other

## 2023-11-15 ENCOUNTER — Ambulatory Visit: Payer: Medicare Other

## 2023-11-15 ENCOUNTER — Other Ambulatory Visit: Payer: Medicare Other

## 2023-11-15 ENCOUNTER — Ambulatory Visit: Payer: Medicare Other | Admitting: Internal Medicine

## 2023-11-16 ENCOUNTER — Ambulatory Visit
Admission: RE | Admit: 2023-11-16 | Discharge: 2023-11-16 | Disposition: A | Discharge status: Home / Self Care | Payer: Medicare Other | Source: Ambulatory Visit | Attending: Internal Medicine | Admitting: Internal Medicine

## 2023-11-16 DIAGNOSIS — Z853 Personal history of malignant neoplasm of breast: Secondary | ICD-10-CM | POA: Diagnosis not present

## 2023-11-16 DIAGNOSIS — C50811 Malignant neoplasm of overlapping sites of right female breast: Secondary | ICD-10-CM | POA: Insufficient documentation

## 2023-11-16 DIAGNOSIS — M85851 Other specified disorders of bone density and structure, right thigh: Secondary | ICD-10-CM | POA: Diagnosis not present

## 2023-11-16 DIAGNOSIS — Z1231 Encounter for screening mammogram for malignant neoplasm of breast: Secondary | ICD-10-CM | POA: Diagnosis not present

## 2023-11-16 DIAGNOSIS — Z170 Estrogen receptor positive status [ER+]: Secondary | ICD-10-CM | POA: Diagnosis present

## 2023-11-22 ENCOUNTER — Inpatient Hospital Stay (HOSPITAL_BASED_OUTPATIENT_CLINIC_OR_DEPARTMENT_OTHER): Payer: Medicare Other | Admitting: Internal Medicine

## 2023-11-22 ENCOUNTER — Inpatient Hospital Stay: Payer: Medicare Other | Attending: Internal Medicine

## 2023-11-22 ENCOUNTER — Encounter: Payer: Self-pay | Admitting: Internal Medicine

## 2023-11-22 ENCOUNTER — Inpatient Hospital Stay: Payer: Medicare Other

## 2023-11-22 DIAGNOSIS — Z1721 Progesterone receptor positive status: Secondary | ICD-10-CM | POA: Diagnosis not present

## 2023-11-22 DIAGNOSIS — E876 Hypokalemia: Secondary | ICD-10-CM | POA: Insufficient documentation

## 2023-11-22 DIAGNOSIS — Z923 Personal history of irradiation: Secondary | ICD-10-CM | POA: Diagnosis not present

## 2023-11-22 DIAGNOSIS — Z170 Estrogen receptor positive status [ER+]: Secondary | ICD-10-CM | POA: Diagnosis not present

## 2023-11-22 DIAGNOSIS — Z794 Long term (current) use of insulin: Secondary | ICD-10-CM | POA: Insufficient documentation

## 2023-11-22 DIAGNOSIS — N183 Chronic kidney disease, stage 3 unspecified: Secondary | ICD-10-CM | POA: Diagnosis not present

## 2023-11-22 DIAGNOSIS — Z7984 Long term (current) use of oral hypoglycemic drugs: Secondary | ICD-10-CM | POA: Insufficient documentation

## 2023-11-22 DIAGNOSIS — M8580 Other specified disorders of bone density and structure, unspecified site: Secondary | ICD-10-CM | POA: Diagnosis not present

## 2023-11-22 DIAGNOSIS — C50811 Malignant neoplasm of overlapping sites of right female breast: Secondary | ICD-10-CM | POA: Diagnosis not present

## 2023-11-22 DIAGNOSIS — Z79899 Other long term (current) drug therapy: Secondary | ICD-10-CM | POA: Insufficient documentation

## 2023-11-22 DIAGNOSIS — E1122 Type 2 diabetes mellitus with diabetic chronic kidney disease: Secondary | ICD-10-CM | POA: Diagnosis not present

## 2023-11-22 DIAGNOSIS — Z7982 Long term (current) use of aspirin: Secondary | ICD-10-CM | POA: Insufficient documentation

## 2023-11-22 DIAGNOSIS — Z1732 Human epidermal growth factor receptor 2 negative status: Secondary | ICD-10-CM | POA: Diagnosis not present

## 2023-11-22 LAB — CMP (CANCER CENTER ONLY)
ALT: 17 U/L (ref 0–44)
AST: 15 U/L (ref 15–41)
Albumin: 3.8 g/dL (ref 3.5–5.0)
Alkaline Phosphatase: 38 U/L (ref 38–126)
Anion gap: 10 (ref 5–15)
BUN: 41 mg/dL — ABNORMAL HIGH (ref 8–23)
CO2: 22 mmol/L (ref 22–32)
Calcium: 10.2 mg/dL (ref 8.9–10.3)
Chloride: 104 mmol/L (ref 98–111)
Creatinine: 1.51 mg/dL — ABNORMAL HIGH (ref 0.44–1.00)
GFR, Estimated: 36 mL/min — ABNORMAL LOW (ref >60)
Glucose, Bld: 196 mg/dL — ABNORMAL HIGH (ref 70–99)
Potassium: 4.2 mmol/L (ref 3.5–5.1)
Sodium: 136 mmol/L (ref 135–145)
Total Bilirubin: 0.7 mg/dL (ref 0.0–1.2)
Total Protein: 6.8 g/dL (ref 6.5–8.1)

## 2023-11-22 LAB — CBC WITH DIFFERENTIAL (CANCER CENTER ONLY)
Abs Immature Granulocytes: 0.07 10*3/uL (ref 0.00–0.07)
Basophils Absolute: 0.1 10*3/uL (ref 0.0–0.1)
Basophils Relative: 1 %
Eosinophils Absolute: 0.3 10*3/uL (ref 0.0–0.5)
Eosinophils Relative: 3 %
HCT: 35.6 % — ABNORMAL LOW (ref 36.0–46.0)
Hemoglobin: 12 g/dL (ref 12.0–15.0)
Immature Granulocytes: 1 %
Lymphocytes Relative: 21 %
Lymphs Abs: 1.7 10*3/uL (ref 0.7–4.0)
MCH: 29.6 pg (ref 26.0–34.0)
MCHC: 33.7 g/dL (ref 30.0–36.0)
MCV: 87.9 fL (ref 80.0–100.0)
Monocytes Absolute: 0.6 10*3/uL (ref 0.1–1.0)
Monocytes Relative: 8 %
Neutro Abs: 5.6 10*3/uL (ref 1.7–7.7)
Neutrophils Relative %: 66 %
Platelet Count: 184 10*3/uL (ref 150–400)
RBC: 4.05 MIL/uL (ref 3.87–5.11)
RDW: 13.1 % (ref 11.5–15.5)
WBC Count: 8.3 10*3/uL (ref 4.0–10.5)
nRBC: 0 % (ref 0.0–0.2)

## 2023-11-22 LAB — VITAMIN D 25 HYDROXY (VIT D DEFICIENCY, FRACTURES): Vit D, 25-Hydroxy: 38.74 ng/mL (ref 30–100)

## 2023-11-22 MED ORDER — IBANDRONATE SODIUM 150 MG PO TABS: 150.0000 mg | ORAL_TABLET | ORAL | 3 refills | Status: AC

## 2023-11-22 NOTE — Progress Notes (Signed)
 [facility] OFFICE PROGRESS NOTE  Patient Care Team: Gauger, Hermenia Fiscal, NP as PCP - General (Internal Medicine) Mickey Farber, MD as Consulting Physician (Internal Medicine) Earna Coder, MD as Consulting Physician (Internal Medicine)  SUMMARY OF ONCOLOGIC HISTORY: Oncology History Overview Note  1.   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 Pathology revealed 1.7 cm grade III invasive mammary carcinoma of now special type. 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 Brianna Rogers underwent re-excision of margin. Persistent positive margin on re-excision with in situ carcinoma < 0.5 mm from margin. There was lymphovascular invasion. One SLN was negative. No chemotherapy based on oncotype. Brianna Rogers received radiation completed 03/03/18. Started AI therapy 03/15/18.    STOPPED in aug 2024.   Again reviewed the oncotype- RS- 9-risk of recurrence < 3 % at 9 years.   2.   Osteopenia- osteopenia present on dexa scan prior to cancer diagnosis or AI treatment. Brianna Rogers receives prolia every 6 months. Last bone density scan was 12/18/2019.       Carcinoma of overlapping sites of right breast in female, estrogen receptor positive (HCC)  11/11/2021 Initial Diagnosis   Carcinoma of overlapping sites of right breast in female, estrogen receptor positive (HCC)    INTERVAL HISTORY: Brianna Rogers.  Ambulating independently.  78 -year-old female patient with a history of stage I ER/PR positive right breast cancer HER2 negative currently on  surveillance is here for follow-up/review results of the bone density and mammogram.   Patient denies any new lumps or bumps.  Denies any chest pain or shortness of breath or cough.   In the interim patient diagnosed with hypokalemia and chronic kidney disease.  Follows up with nephrology.  Review of Systems  Constitutional:  Negative for chills, diaphoresis, fever, malaise/fatigue and weight loss.  HENT:   Negative for nosebleeds and sore throat.   Eyes:  Negative for double vision.  Respiratory:  Negative for cough, hemoptysis, sputum production, shortness of breath and wheezing.   Cardiovascular:  Negative for chest pain, palpitations, orthopnea and leg swelling.  Gastrointestinal:  Negative for abdominal pain, blood in stool, constipation, diarrhea, heartburn, melena, nausea and vomiting.  Genitourinary:  Negative for dysuria, frequency and urgency.  Musculoskeletal:  Positive for joint pain and neck pain. Negative for back pain.  Skin: Negative.  Negative for itching and rash.  Neurological:  Negative for dizziness, tingling, focal weakness, weakness and headaches.  Endo/Heme/Allergies:  Does not bruise/bleed easily.  Psychiatric/Behavioral:  Negative for depression. The patient is not nervous/anxious and does not have insomnia.      ALLERGIES:  is allergic to alendronate, bactrim [sulfamethoxazole-trimethoprim], empagliflozin, macrodantin [nitrofurantoin macrocrystal], sulfa antibiotics, and latex.  MEDICATIONS:  Current Outpatient Medications  Medication Sig Dispense Refill   acetaminophen (TYLENOL) 500 MG tablet Take 500 mg by mouth every 6 (six) hours as needed for moderate pain.     amLODipine (NORVASC) 5 MG tablet Take 10 mg by mouth every morning.     aspirin EC 81 MG tablet Take 81 mg by mouth every evening.      B-D ULTRAFINE III SHORT PEN 31G X 8 MM MISC      cloNIDine (CATAPRES) 0.1 MG tablet Take 0.1 mg by mouth 2 (two) times daily.     Continuous Blood Gluc Sensor (FREESTYLE LIBRE SENSOR SYSTEM) MISC Use 3 each every 10 (ten) days     hydrochlorothiazide (MICROZIDE) 12.5 MG capsule Take 12.5 mg by mouth 2 (two) times  daily.     ibandronate (BONIVA) 150 MG tablet Take 1 tablet (150 mg total) by mouth every 30 (thirty) days. Take in the morning with a full glass of water, on an empty stomach, and do not take anything else by mouth or lie down for the next 30 min. 3 tablet 3    insulin lispro (HUMALOG) 100 UNIT/ML injection Inject 11 Units into the skin 2 (two) times daily before a meal. 11 units at lunch and 9 units at dinner     lisinopril (ZESTRIL) 20 MG tablet Take 20 mg by mouth 2 (two) times daily.     meloxicam (MOBIC) 15 MG tablet Take 15 mg by mouth daily.     metFORMIN (GLUCOPHAGE) 1000 MG tablet Take 1,000 mg by mouth 2 (two) times daily with a meal.     Omega-3 Fatty Acids (FISH OIL) 1200 MG CAPS Take 2,400 mg by mouth 2 (two) times daily.      pregabalin (LYRICA) 25 MG capsule Take 25 mg by mouth 3 (three) times daily.     rosuvastatin (CRESTOR) 5 MG tablet Take 5 mg by mouth every other day. In the evening     TOUJEO MAX SOLOSTAR 300 UNIT/ML Solostar Pen 46 units in morning     VELTASSA 8.4 g packet Take 1 packet by mouth daily.     calcium-vitamin D (OSCAL WITH D) 500-5 MG-MCG tablet Take 2 tablets by mouth 2 (two) times daily. 120 tablet 2   No current facility-administered medications for this visit.    PHYSICAL EXAMINATION: ECOG PERFORMANCE STATUS: 0 - Asymptomatic  Vitals:   11/22/23 0925 11/22/23 0938  BP: (!) 170/72 (!) 170/80  Pulse: (!) 59   Temp: (!) 96.5 F (35.8 C)   SpO2: 98%    Filed Weights   11/22/23 0925  Weight: 169 lb 6.4 oz (76.8 kg)    Physical Exam Vitals and nursing note reviewed.  HENT:     Head: Normocephalic and atraumatic.     Mouth/Throat:     Pharynx: Oropharynx is clear.  Eyes:     Extraocular Movements: Extraocular movements intact.     Pupils: Pupils are equal, round, and reactive to light.  Cardiovascular:     Rate and Rhythm: Normal rate and regular rhythm.  Pulmonary:     Comments: Decreased breath sounds bilaterally.  Abdominal:     Palpations: Abdomen is soft.  Musculoskeletal:        General: Normal range of motion.     Cervical back: Normal range of motion.  Skin:    General: Skin is warm.  Neurological:     General: No focal deficit present.     Mental Status: Brianna Rogers is alert and oriented  to person, place, and time.  Psychiatric:        Behavior: Behavior normal.        Judgment: Judgment normal.     LABORATORY DATA:  I have reviewed the data as listed    Component Value Date/Time   NA 136 11/22/2023 0923   K 4.2 11/22/2023 0923   CL 104 11/22/2023 0923   CO2 22 11/22/2023 0923   GLUCOSE 196 (H) 11/22/2023 0923   BUN 41 (H) 11/22/2023 0923   CREATININE 1.51 (H) 11/22/2023 0923   CALCIUM 10.2 11/22/2023 0923   CALCIUM 10.1 03/15/2018 1151   PROT 6.8 11/22/2023 0923   ALBUMIN 3.8 11/22/2023 0923   AST 15 11/22/2023 0923   ALT 17 11/22/2023 0923   ALKPHOS 38  11/22/2023 0923   BILITOT 0.7 11/22/2023 0923   GFRNONAA 36 (L) 11/22/2023 0923   GFRAA >60 05/01/2020 1339    No results found for: "SPEP", "UPEP"  Lab Results  Component Value Date   WBC 8.3 11/22/2023   NEUTROABS 5.6 11/22/2023   HGB 12.0 11/22/2023   HCT 35.6 (L) 11/22/2023   MCV 87.9 11/22/2023   PLT 184 11/22/2023      Chemistry      Component Value Date/Time   NA 136 11/22/2023 0923   K 4.2 11/22/2023 0923   CL 104 11/22/2023 0923   CO2 22 11/22/2023 0923   BUN 41 (H) 11/22/2023 0923   CREATININE 1.51 (H) 11/22/2023 0923      Component Value Date/Time   CALCIUM 10.2 11/22/2023 0923   CALCIUM 10.1 03/15/2018 1151   ALKPHOS 38 11/22/2023 0923   AST 15 11/22/2023 0923   ALT 17 11/22/2023 0923   BILITOT 0.7 11/22/2023 0923       Lab Results  Component Value Date   CA2729 15.4 05/13/2023   CA2729 10.5 11/12/2022   CA2729 15.4 05/12/2022   CA2729 17.5 11/11/2021   CA2729 18.2 05/09/2021   CA2729 22.9 11/04/2020     RADIOGRAPHIC STUDIES: I have personally reviewed the radiological images as listed and agreed with the findings in the report. No results found.   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 Pathology  revealed 1.7 cm grade III invasive mammary carcinoma of now special type. 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 Brianna Rogers underwent re-excision of margin. Persistent positive margin on re-excision with in situ carcinoma < 0.5 mm from margin. There was lymphovascular invasion. One SLN was negative.  No chemotherapy based on oncotype. S/-p radiation completed 03/03/18.  Started AI therapy 03/15/18. Mammogram from  FEB, 2024- was reviewed today, no radiographic evidence of disease. Clinically asymptomatic. STOP in aug 2024.   Again reviewed the oncotype- RS- 9-risk of recurrence < 3 % at 9 years.    #  Osteopenia- .MARCH 2021- T score= -1.4.  Brianna Rogers continues weight bearing exercise. Labs reviewed and ok for prolia today. vit D levels; FEB 2025-  T-score of -1.5- OFF vit D/ca sec to hypercalcemia- STOP prolia. Fosamax remote >> 30 years-- itching.  Will plan to start Boniva once a month 150 mg monthly.   # CKD stage III/ hyperkalemia-[ Dr.Lateef GFR 40- stable.   # Diabetes [on insulin; GLP agonist; metformin]- improved. Stable.   # Hx of Hypercalcemia- 10 [s/p PCP]- stable.    Disposition: #  bil screening mammogram in FEB 2026.  # HOLD Prolia today # 12  months MD (labs- cbc, cmp, ca27.29; 25-OH vit D levels), -Dr.B      Orders Placed This Encounter  Procedures   MM 3D SCREENING MAMMOGRAM BILATERAL BREAST    Standing Status:   Future    Expected Date:   10/24/2024    Expiration Date:   11/20/2024    Reason for Exam (SYMPTOM  OR DIAGNOSIS REQUIRED):   Breast Cancer    Preferred imaging location?:   [facility]   CBC with Differential ([facility] Only)    Standing Status:   Future    Expected Date:   11/20/2024    Expiration Date:   11/21/2024   CMP ([facility] only)    Standing Status:  Future    Expected Date:   11/20/2024    Expiration Date:   11/21/2024   Cancer antigen 27.29    Standing Status:   Future    Expected Date:   11/20/2024    Expiration Date:   11/21/2024    VITAMIN D 25 Hydroxy (Vit-D Deficiency, Fractures)    Standing Status:   Future    Expected Date:   11/20/2024    Expiration Date:   11/21/2024       Earna Coder, MD 11/22/2023 11:15 AM

## 2023-11-22 NOTE — Assessment & Plan Note (Addendum)
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 Pathology revealed 1.7 cm grade III invasive mammary carcinoma of now special type. 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 She underwent re-excision of margin. Persistent positive margin on re-excision with in situ carcinoma < 0.5 mm from margin. There was lymphovascular invasion. One SLN was negative.  No chemotherapy based on oncotype. S/-p radiation completed 03/03/18.  Started AI therapy 03/15/18. Mammogram from  FEB, 2024- was reviewed today, no radiographic evidence of disease. Clinically asymptomatic. STOP in aug 2024.   Again reviewed the oncotype- RS- 9-risk of recurrence < 3 % at 9 years.    #  Osteopenia- .MARCH 2021- T score= -1.4.  She continues weight bearing exercise. Labs reviewed and ok for prolia today. vit D levels; FEB 2025-  T-score of -1.5- OFF vit D/ca sec to hypercalcemia- STOP prolia. Fosamax remote >> 30 years-- itching.  Will plan to start Boniva once a month 150 mg monthly.   # CKD stage III/ hyperkalemia-[ Dr.Lateef GFR 40- stable.   # Diabetes [on insulin; GLP agonist; metformin]- improved. Stable.   # Hx of Hypercalcemia- 10 [s/p PCP]- stable.    Disposition: #  bil screening mammogram in FEB 2026.  # HOLD Prolia today # 12  months MD (labs- cbc, cmp, ca27.29; 25-OH vit D levels), -Dr.B

## 2023-11-22 NOTE — Progress Notes (Signed)
 C/o pain rt breast/arm pit area. She thinks may be a pulled muscle not sure.   Mammogram and bone density 11/16/23.

## 2023-11-23 LAB — CANCER ANTIGEN 27.29: CA 27.29: 20.5 U/mL (ref 0.0–38.6)

## 2023-12-17 ENCOUNTER — Ambulatory Visit
Admission: EM | Admit: 2023-12-17 | Discharge: 2023-12-17 | Disposition: A | Discharge status: Home / Self Care | Attending: Physician Assistant | Admitting: Physician Assistant

## 2023-12-17 ENCOUNTER — Ambulatory Visit (INDEPENDENT_AMBULATORY_CARE_PROVIDER_SITE_OTHER)

## 2023-12-17 DIAGNOSIS — M25511 Pain in right shoulder: Secondary | ICD-10-CM

## 2023-12-17 DIAGNOSIS — I10 Essential (primary) hypertension: Secondary | ICD-10-CM | POA: Diagnosis not present

## 2023-12-17 DIAGNOSIS — W11XXXA Fall on and from ladder, initial encounter: Secondary | ICD-10-CM | POA: Diagnosis not present

## 2023-12-17 NOTE — ED Triage Notes (Signed)
 Patient states that she was on a 3 step step stool. Patient states that she was stepping down and fell on her right shoulder.

## 2023-12-17 NOTE — Discharge Instructions (Addendum)
 SHOULDER PAIN: No fractures. Take Tylenol for pain and use ice and/or heat for relief. Try to stretch shoulder so it does not freeze/stiffen.  BP: BP is high. Keep taking BP meds and check BP at home. If consistently >140/90 follow up with PCP.

## 2023-12-17 NOTE — ED Provider Notes (Signed)
 [facility]    CSN: [id number] Arrival date & time: 12/17/23  1655      History   Chief Complaint Chief Complaint  Patient presents with   Fall   Shoulder Pain    HPI Brianna Rogers is a 78 y.o. female presenting for right shoulder and upper arm pain following accidental fall off of a 3 step ladder 1 hour ago.  Patient states the ladder was wobbly and caused her to fall.  She denies head injury or loss of consciousness.  Denies any pain in her ribs, chest or back.  Reports her shoulder pain is about a 4 out of 10 but she just wants to make sure nothing is broken.  She has full range of motion but it hurts to raise her arm.  No numbness, weakness or tingling.  Denies headaches, dizziness, weakness, breathing difficulty.  Has not taken anything for pain.  Not on any anticoagulants.  HPI  Past Medical History:  Diagnosis Date   Breast cancer (HCC)    Breast cancer, right (HCC) 11/18/2017   Chronic kidney disease    Complication of anesthesia    Diabetes mellitus without complication (HCC)    Heart murmur    Hypertension    Lichen sclerosus et atrophicus    Personal history of radiation therapy    PONV (postoperative nausea and vomiting)    Sleep apnea     Patient Active Problem List   Diagnosis Date Noted   Carcinoma of overlapping sites of right breast in female, estrogen receptor positive (HCC) 11/11/2021   Hyperlipidemia, mixed 11/11/2021   Microalbuminuric diabetic nephropathy (HCC) 11/11/2021   OSA (obstructive sleep apnea) 06/20/2021   History of breast cancer 05/09/2021   Long term (current) use of aromatase inhibitors 05/09/2021   Carpal tunnel syndrome, right 07/22/2020   Hypercalcemia 03/20/2018   Osteopenia of neck of left femur 12/20/2017   Breast cancer, right (HCC) 11/18/2017   Cardiac murmur 09/22/2015   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    BREAST LUMPECTOMY Right 12/06/2017   invasive mammary carcinoma DCIS LCIS  with RAD   CARPAL TUNNEL RELEASE Right 08/13/2020   Procedure: CARPAL TUNNEL RELEASE ENDOSCOPIC WITH RELEASE OF RIGHT LONG TRIGGER FINGER;  Surgeon: Christena Flake, MD;  Location: [facility];  Service: Orthopedics;  Laterality: Right;   COLONOSCOPY     COLONOSCOPY WITH PROPOFOL N/A 11/15/2017   Procedure: COLONOSCOPY WITH PROPOFOL;  Surgeon: Christena Deem, MD;  Location: [facility] ENDOSCOPY;  Service: Endoscopy;  Laterality: N/A;   COLONOSCOPY WITH PROPOFOL N/A 07/30/2023   Procedure: COLONOSCOPY WITH PROPOFOL;  Surgeon: Regis Bill, MD;  Location: [facility] ENDOSCOPY;  Service: Endoscopy;  Laterality: N/A;   PARTIAL MASTECTOMY WITH NEEDLE LOCALIZATION Right 12/06/2017   Procedure: PARTIAL MASTECTOMY WITH NEEDLE LOCALIZATION;  Surgeon: Carolan Shiver, MD;  Location: [facility];  Service: General;  Laterality: Right;   SENTINEL NODE BIOPSY Right 12/06/2017   Procedure: SENTINEL NODE BIOPSY;  Surgeon: Carolan Shiver, MD;  Location: [facility];  Service: General;  Laterality: Right;   SEPTOPLASTY     TRIGGER FINGER RELEASE Right 12/03/2020   Procedure: RELEASE TRIGGER FINGER/A-1 PULLEY;  Surgeon: Joice Lofts,  Excell Seltzer, MD;  Location: [facility] OR[facility];  Service: Orthopedics;  Laterality: Right;   TRIGGER FINGER RELEASE Left 02/11/2021   Procedure: RELEASE OF LEFT INDEX AND RING TRIGGER FINGERS AND ENDOSCOPIC LEFT CARPAL TUNNEL RELEASE;  Surgeon: Christena Flake, MD;  Location: [facility];  Service: Orthopedics;  Laterality: Left;   TUBAL LIGATION      OB History   No obstetric history on file.      Home Medications    Prior to Admission medications   Medication Sig Start Date End Date Taking?  Authorizing Provider  amLODipine (NORVASC) 5 MG tablet Take 10 mg by mouth every morning. 10/01/20  Yes [provider]  aspirin EC 81 MG tablet Take 81 mg by mouth every evening.    Yes [provider]  cloNIDine (CATAPRES) 0.1 MG tablet Take 0.1 mg by mouth 2 (two) times daily.   Yes [provider]  hydrochlorothiazide (MICROZIDE) 12.5 MG capsule Take 12.5 mg by mouth 2 (two) times daily. 06/20/21  Yes [provider]  insulin lispro (HUMALOG) 100 UNIT/ML injection Inject 11 Units into the skin 2 (two) times daily before a meal. 11 units at lunch and 9 units at dinner   Yes [provider]  lisinopril (ZESTRIL) 20 MG tablet Take 20 mg by mouth 2 (two) times daily. 12/19/21  Yes [provider]  meloxicam (MOBIC) 15 MG tablet Take 15 mg by mouth daily.   Yes [provider]  metFORMIN (GLUCOPHAGE) 1000 MG tablet Take 1,000 mg by mouth 2 (two) times daily with a meal.   Yes [provider]  pregabalin (LYRICA) 25 MG capsule Take 25 mg by mouth 3 (three) times daily.   Yes [provider]  rosuvastatin (CRESTOR) 5 MG tablet Take 5 mg by mouth every other day. In the evening   Yes [provider]  TOUJEO MAX SOLOSTAR 300 UNIT/ML Solostar Pen 46 units in morning 04/19/22  Yes [provider]  VELTASSA 8.4 g packet Take 1 packet by mouth daily.   Yes [provider]  acetaminophen (TYLENOL) 500 MG tablet Take 500 mg by mouth every 6 (six) hours as needed for moderate pain.    [provider]  B-D ULTRAFINE III SHORT PEN 31G X 8 MM MISC  03/20/19   [provider]  calcium-vitamin D (OSCAL WITH D) 500-5 MG-MCG tablet Take 2 tablets by mouth 2 (two) times daily. 06/05/22 11/12/22  Rushie Chestnut, PA-C  Continuous Blood Gluc Sensor (FREESTYLE LIBRE SENSOR SYSTEM) MISC Use 3 each every 10 (ten) days 02/22/18   [provider]  ibandronate (BONIVA) 150 MG tablet Take 1 tablet  (150 mg total) by mouth every 30 (thirty) days. Take in the morning with a full glass of water, on an empty stomach, and do not take anything else by mouth or lie down for the next 30 min. 11/22/23   Earna Coder, MD  Omega-3 Fatty Acids (FISH OIL) 1200 MG CAPS Take 2,400 mg by mouth 2 (two) times daily.     [provider]    Family History Family History  Problem Relation Age of Onset   Breast cancer Mother 53   Cancer Mother    Cancer Maternal Aunt    Cancer Maternal Grandmother     Social History Social History   Tobacco Use   Smoking status: Never   Smokeless tobacco: Never  Vaping Use   Vaping status: Never Used  Substance Use Topics  Alcohol use: Not Currently    Comment: RAREly   Drug use: No     Allergies   Alendronate, Bactrim [sulfamethoxazole-trimethoprim], Empagliflozin, Macrodantin [nitrofurantoin macrocrystal], Sulfa antibiotics, and Latex   Review of Systems Review of Systems  Constitutional:  Negative for fatigue.  Respiratory:  Negative for shortness of breath.   Cardiovascular:  Negative for chest pain.  Musculoskeletal:  Positive for arthralgias. Negative for back pain, gait problem, joint swelling, neck pain and neck stiffness.  Neurological:  Negative for dizziness, syncope, weakness, numbness and headaches.     Physical Exam Triage Vital Signs ED Triage Vitals  Encounter Vitals Group     BP 12/17/23 1715 (!) 182/78     Systolic BP Percentile --      Diastolic BP Percentile --      Pulse Rate 12/17/23 1715 77     Resp 12/17/23 1715 18     Temp 12/17/23 1715 98.4 F (36.9 C)     Temp Source 12/17/23 1715 Oral     SpO2 12/17/23 1715 97 %     Weight --      Height --      Head Circumference --      Peak Flow --      Pain Score 12/17/23 1712 4     Pain Loc --      Pain Education --      Exclude from Growth Chart --    No data found.  Updated Vital Signs BP (!) 173/79 (BP Location: Left Arm)   Pulse 77   Temp 98.4  F (36.9 C) (Oral)   Resp 18   SpO2 97%     Physical Exam Vitals and nursing note reviewed.  Constitutional:      General: She is not in acute distress.    Appearance: Normal appearance. She is not ill-appearing or toxic-appearing.  HENT:     Head: Normocephalic and atraumatic.     Nose: Nose normal.     Mouth/Throat:     Mouth: Mucous membranes are moist.     Pharynx: Oropharynx is clear.  Eyes:     General: No scleral icterus.       Right eye: No discharge.        Left eye: No discharge.     Extraocular Movements: Extraocular movements intact.     Conjunctiva/sclera: Conjunctivae normal.     Pupils: Pupils are equal, round, and reactive to light.  Cardiovascular:     Rate and Rhythm: Normal rate and regular rhythm.     Heart sounds: Normal heart sounds.  Pulmonary:     Effort: Pulmonary effort is normal. No respiratory distress.     Breath sounds: Normal breath sounds.  Musculoskeletal:     Right shoulder: Tenderness (AC joint, distal clavicle, proximal and mid humerus) present. No swelling. Normal range of motion. Normal strength. Normal pulse.     Cervical back: Neck supple.  Skin:    General: Skin is dry.  Neurological:     General: No focal deficit present.     Mental Status: She is alert and oriented to person, place, and time. Mental status is at baseline.     Cranial Nerves: No cranial nerve deficit (grossly intact).     Motor: No weakness.     Coordination: Coordination normal.     Gait: Gait normal.  Psychiatric:        Mood and Affect: Mood normal.        Behavior: Behavior normal.  UC Treatments / Results  Labs (all labs ordered are listed, but only abnormal results are displayed) Labs Reviewed - No data to display  EKG   Radiology DG Shoulder Right Result Date: 12/17/2023 CLINICAL DATA:  Right shoulder pain after fall. EXAM: RIGHT SHOULDER - 2+ VIEW COMPARISON:  None Available. FINDINGS: There is no evidence of fracture or dislocation.  Mild degenerative changes are seen involving the right acromioclavicular and glenohumeral joints. Soft tissues are unremarkable. IMPRESSION: Mild degenerative changes as noted above. No acute abnormality seen. Electronically Signed   By: Lupita Raider M.D.   On: 12/17/2023 18:01   DG Humerus Right Result Date: 12/17/2023 CLINICAL DATA:  Right arm pain after fall. EXAM: RIGHT HUMERUS - 2+ VIEW COMPARISON:  None Available. FINDINGS: There is no evidence of fracture or other focal bone lesions. Soft tissues are unremarkable. IMPRESSION: Negative. Electronically Signed   By: Lupita Raider M.D.   On: 12/17/2023 18:00    Procedures Procedures (including critical care time)  Medications Ordered in UC Medications - No data to display  Initial Impression / Assessment and Plan / UC Course  I have reviewed the triage vital signs and the nursing notes.  Pertinent labs & imaging results that were available during my care of the patient were reviewed by me and considered in my medical decision making (see chart for details).   78 year old female presents for right shoulder pain after falling from a 3 step ladder onto her right shoulder about an hour ago.  No head injury, loss of consciousness or other injuries reported.  BP 182/78.  History of hypertension.  Patient takes amlodipine, clonidine, HCTZ and lisinopril. BP re-check is 173/79. Advised to continue checking blood pressure. Patient says it is usually much better at home but she has been jarred since the fall. If consistently greater than 140/90 follow-up with PCP.  On exam has tenderness of AC joint of right shoulder, distal clavicle, proximal and mid humerus.  Full range of motion.  Cranial nerves grossly intact and no evidence of head injury.  X-ray of the right shoulder and humerus obtained.  Imaging negative. Advised Tylenol, cryotherapy, heat and stretching. Reviewed return precautions.    Final Clinical Impressions(s) / UC Diagnoses    Final diagnoses:  Acute pain of right shoulder  Essential hypertension  Fall from ladder, initial encounter     Discharge Instructions      SHOULDER PAIN: No fractures. Take Tylenol for pain and use ice and/or heat for relief. Try to stretch shoulder so it does not freeze/stiffen.  BP: BP is high. Keep taking BP meds and check BP at home. If consistently >140/90 follow up with PCP.     ED Prescriptions   None    PDMP not reviewed this encounter.   Shirlee Latch, PA-C 12/17/23 [phone]

## 2024-07-07 ENCOUNTER — Emergency Department
Admission: EM | Admit: 2024-07-07 | Discharge: 2024-07-07 | Disposition: A | Discharge status: Home / Self Care | Attending: Emergency Medicine | Admitting: Emergency Medicine

## 2024-07-07 ENCOUNTER — Other Ambulatory Visit: Payer: Self-pay

## 2024-07-07 ENCOUNTER — Emergency Department

## 2024-07-07 DIAGNOSIS — M722 Plantar fascial fibromatosis: Secondary | ICD-10-CM | POA: Diagnosis not present

## 2024-07-07 DIAGNOSIS — S93492A Sprain of other ligament of left ankle, initial encounter: Secondary | ICD-10-CM | POA: Diagnosis not present

## 2024-07-07 DIAGNOSIS — I129 Hypertensive chronic kidney disease with stage 1 through stage 4 chronic kidney disease, or unspecified chronic kidney disease: Secondary | ICD-10-CM | POA: Insufficient documentation

## 2024-07-07 DIAGNOSIS — E1122 Type 2 diabetes mellitus with diabetic chronic kidney disease: Secondary | ICD-10-CM | POA: Insufficient documentation

## 2024-07-07 DIAGNOSIS — N189 Chronic kidney disease, unspecified: Secondary | ICD-10-CM | POA: Diagnosis not present

## 2024-07-07 DIAGNOSIS — X58XXXA Exposure to other specified factors, initial encounter: Secondary | ICD-10-CM | POA: Diagnosis not present

## 2024-07-07 DIAGNOSIS — S99912A Unspecified injury of left ankle, initial encounter: Secondary | ICD-10-CM | POA: Diagnosis present

## 2024-07-07 DIAGNOSIS — M25572 Pain in left ankle and joints of left foot: Secondary | ICD-10-CM

## 2024-07-07 MED ORDER — OXYCODONE HCL 5 MG PO TABS
5.0000 mg | ORAL_TABLET | Freq: Once | ORAL | Status: AC
  Administered 2024-07-07: 5 mg via ORAL
  Filled 2024-07-07: qty 1

## 2024-07-07 MED ORDER — ACETAMINOPHEN 500 MG PO TABS
1000.0000 mg | ORAL_TABLET | Freq: Once | ORAL | Status: AC
  Administered 2024-07-07: 1000 mg via ORAL
  Filled 2024-07-07: qty 2

## 2024-07-07 MED ORDER — OXYCODONE HCL 5 MG PO TABS: 5.0000 mg | ORAL_TABLET | Freq: Three times a day (TID) | ORAL | 0 refills | Status: AC | PRN

## 2024-07-07 NOTE — ED Triage Notes (Signed)
 Patient to ED via [facility] for left ankle pain; states she has had intermittent pain for years but since Thursday afternoon unable to bare weight.

## 2024-07-07 NOTE — ED Notes (Addendum)
 Pt does not wish to get in the bed at this time. Pt expresses comfort in the wheelchair. Pt instructed not to try to get up without first asking staff for assistance. Family in room also aware of same.

## 2024-07-07 NOTE — Discharge Instructions (Addendum)
 Use Tylenol  for pain and fevers.  Up to 1000 mg per dose, up to 4 times per day.  Do not take more than 4000 mg of Tylenol /acetaminophen  within 24 hours..  Oxycodone  for more severe/breakthrough pain or to help with sleep at night..  You can cut these tablets in half if it feels like it is too much  Ice and elevation  I would wear the boot at night while you are sleeping, but you can take it off to bathe or change clothes  Follow-up with the podiatrist in the clinic.  Reach out to them to schedule this appointment.  They should be able to see you locally

## 2024-07-07 NOTE — ED Provider Notes (Signed)
 [facility] Provider Note    Event Date/Time   First MD Initiated Contact with Patient 07/07/24 1413     (approximate)   History   Ankle Pain   HPI  Brianna Rogers is a 78 y.o. female who presents to the ED for evaluation of Ankle Pain   I review nephrology clinic visit from yesterday.  History of DM, CKD, HTN  Patient presents with acute on chronic left ankle pain.  Denies any particular trauma but she reports intermittent pain to this left ankle of a similar quality for multiple years but never such as severe intensity as today.  Reports pain to the plantar aspect of her foot in the left anterior part of her ankle without any discrete traumas or injuries recently.   Physical Exam   Triage Vital Signs: ED Triage Vitals  Encounter Vitals Group     BP 07/07/24 1405 (!) 167/65     Girls Systolic BP Percentile --      Girls Diastolic BP Percentile --      Boys Systolic BP Percentile --      Boys Diastolic BP Percentile --      Pulse Rate 07/07/24 1404 74     Resp 07/07/24 1404 18     Temp 07/07/24 1404 98.8 F (37.1 C)     Temp Source 07/07/24 1404 Oral     SpO2 07/07/24 1404 100 %     Weight --      Height --      Head Circumference --      Peak Flow --      Pain Score 07/07/24 1403 3     Pain Loc --      Pain Education --      Exclude from Growth Chart --     Most recent vital signs: Vitals:   07/07/24 1404 07/07/24 1405  BP:  (!) 167/65  Pulse: 74   Resp: 18   Temp: 98.8 F (37.1 C)   SpO2: 100%     General: Awake, no distress.  CV:  Good peripheral perfusion.  Resp:  Normal effort.  Abd:  No distention.  MSK:  No deformity noted.  Neuro:  No focal deficits appreciated. Other:  Foot is well-perfused without skin changes, deformity or signs of trauma. Tenderness over the left sided anterior talofibular ligament Tenderness over the plantar fascia, primarily at insertion, lesser at the origin   ED Results / Procedures /  Treatments   Labs (all labs ordered are listed, but only abnormal results are displayed) Labs Reviewed - No data to display  EKG   RADIOLOGY Plain film of the left ankle interpreted by me without evidence of acute fracture or dislocation  Official radiology report(s): DG Ankle Complete Left Result Date: 07/07/2024 EXAM: 3 OR MORE VIEW(S) XRAY OF THE LEFT ANKLE 07/07/2024 02:18:59 PM CLINICAL HISTORY: pain. NKI, Pt states left ankle pain COMPARISON: None available. FINDINGS: BONES AND JOINTS: No acute fracture. No focal osseous lesion. No joint dislocation. Plantar calcaneal spur. SOFT TISSUES: Soft tissue swelling surrounding the ankle. Vascular calcifications. IMPRESSION: 1. No acute osseous abnormality. 2. Soft tissue swelling surrounding the ankle. Electronically signed by: Rockey Kilts MD 07/07/2024 03:17 PM EDT RP Workstation: [id number]    PROCEDURES and INTERVENTIONS:  Procedures  Medications  acetaminophen  (TYLENOL ) tablet 1,000 mg (1,000 mg Oral Given 07/07/24 1543)  oxyCODONE  (Oxy IR/ROXICODONE ) immediate release tablet 5 mg (5 mg Oral Given 07/07/24 1543)     IMPRESSION /  MDM / ASSESSMENT AND PLAN / ED COURSE  I reviewed the triage vital signs and the nursing notes.  Differential diagnosis includes, but is not limited to, fracture dislocation, soft tissue sprain, cellulitis, ischemic foot  Patient presents with acute on chronic left ankle pain with signs of soft tissue tenderness on exam.  Reassuring x-rays.  No ischemic features, skin changes or other concerns on my exam.  Has tenderness over anterior ankle ligaments as well as plantar fascia.  Will provide a boot for immobilization, discussed RICE therapy, pain control and podiatric follow-up.        FINAL CLINICAL IMPRESSION(S) / ED DIAGNOSES   Final diagnoses:  Acute left ankle pain  Sprain of anterior talofibular ligament of left ankle, initial encounter  Plantar fasciitis     Rx / DC Orders   ED  Discharge Orders          Ordered    oxyCODONE  (ROXICODONE ) 5 MG immediate release tablet  Every 8 hours PRN        07/07/24 1547             Note:  This document was prepared using Dragon voice recognition software and may include unintentional dictation errors.   [name], MD 07/07/24 [phone]

## 2024-07-07 NOTE — ED Notes (Signed)
 Pt in x-ray at this time

## 2024-08-03 ENCOUNTER — Other Ambulatory Visit: Payer: Self-pay | Admitting: Internal Medicine

## 2024-08-03 DIAGNOSIS — Z1231 Encounter for screening mammogram for malignant neoplasm of breast: Secondary | ICD-10-CM

## 2024-09-13 ENCOUNTER — Ambulatory Visit
Admission: EM | Admit: 2024-09-13 | Discharge: 2024-09-13 | Disposition: A | Discharge status: Home / Self Care | Attending: Family Medicine | Admitting: Family Medicine

## 2024-09-13 DIAGNOSIS — K6289 Other specified diseases of anus and rectum: Secondary | ICD-10-CM | POA: Diagnosis not present

## 2024-09-13 DIAGNOSIS — K5900 Constipation, unspecified: Secondary | ICD-10-CM

## 2024-09-13 MED ORDER — LACTULOSE 20 GM/30ML PO SOLN: 15.0000 mL | Freq: Two times a day (BID) | ORAL | 0 refills | Status: AC | PRN

## 2024-09-13 NOTE — ED Provider Notes (Signed)
 " [facility]    CSN: [id number] Arrival date & time: 09/13/24  1048      History   Chief Complaint Chief Complaint  Patient presents with   Constipation   Chills    HPI Brianna Rogers is a 78 y.o. female.   HPI  Brianna Rogers presents for rectal pressure and constipation that started this morning around 715 AM.  She was able to have a bowel movement prior to arrival.  Reports rectal pain.  She pulled hard stool balls out of her rectum with her finger. She has had chills and trembling.  She feels like there is stool still up there. She panicking.  She is a diabetic . Blood sugar was 189-207 after breakfast and eating a kind bar this morning. She has been on Ozempic for the past 2 months and knows that constipation is a side effect.  She injects 0.5 mg weekly. No changes in medication.    Past Medical History:  Diagnosis Date   Breast cancer (HCC)    Breast cancer, right (HCC) 11/18/2017   Chronic kidney disease    Complication of anesthesia    Diabetes mellitus without complication (HCC)    Heart murmur    Hypertension    Lichen sclerosus et atrophicus    Personal history of radiation therapy    PONV (postoperative nausea and vomiting)    Sleep apnea     Patient Active Problem List   Diagnosis Date Noted   Carcinoma of overlapping sites of right breast in female, estrogen receptor positive (HCC) 11/11/2021   Hyperlipidemia, mixed 11/11/2021   Microalbuminuric diabetic nephropathy (HCC) 11/11/2021   OSA (obstructive sleep apnea) 06/20/2021   History of breast cancer 05/09/2021   Long term (current) use of aromatase inhibitors 05/09/2021   Carpal tunnel syndrome, right 07/22/2020   Hypercalcemia 03/20/2018   Osteopenia of neck of left femur 12/20/2017   Breast cancer, right (HCC) 11/18/2017   Cardiac murmur 09/22/2015   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    BREAST LUMPECTOMY Right 12/06/2017   invasive mammary carcinoma DCIS LCIS  with RAD   CARPAL TUNNEL RELEASE Right 08/13/2020   Procedure: CARPAL TUNNEL RELEASE ENDOSCOPIC WITH RELEASE OF RIGHT LONG TRIGGER FINGER;  Surgeon: Edie Norleen PARAS, MD;  Location: [facility];  Service: Orthopedics;  Laterality: Right;   COLONOSCOPY     COLONOSCOPY WITH PROPOFOL  N/A 11/15/2017   Procedure: COLONOSCOPY WITH PROPOFOL ;  Surgeon: Gaylyn Gladis PENNER, MD;  Location: [facility] END[facility];  Service: Endoscopy;  Laterality: N/A;   COLONOSCOPY WITH PROPOFOL  N/A 07/30/2023   Procedure: COLONOSCOPY WITH PROPOFOL ;  Surgeon: Maryruth Ole DASEN, MD;  Location: [facility] ENDOSCOPY;  Service: Endoscopy;  Laterality: N/A;   PARTIAL MASTECTOMY WITH NEEDLE LOCALIZATION Right 12/06/2017   Procedure: PARTIAL MASTECTOMY WITH NEEDLE LOCALIZATION;  Surgeon: Rodolph Romano, MD;  Location: [facility];  Service: General;  Laterality: Right;   SENTINEL NODE BIOPSY Right 12/06/2017   Procedure: SENTINEL NODE BIOPSY;  Surgeon: Rodolph Romano, MD;  Location: [facility];  Service: General;  Laterality: Right;   SEPTOPLASTY     TRIGGER FINGER RELEASE Right 12/03/2020   Procedure: RELEASE TRIGGER FINGER/A-1 PULLEY;  Surgeon: Edie,  Norleen PARAS, MD;  Location: [facility];  Service: Orthopedics;  Laterality: Right;   TRIGGER FINGER RELEASE Left 02/11/2021   Procedure: RELEASE OF LEFT INDEX AND RING TRIGGER FINGERS AND ENDOSCOPIC LEFT CARPAL TUNNEL RELEASE;  Surgeon: Edie Norleen PARAS, MD;  Location: [facility];  Service: Orthopedics;  Laterality: Left;   TUBAL LIGATION      OB History   No obstetric history on file.      Home Medications    Prior to Admission medications  Medication Sig Start Date End Date Taking? Authorizing Provider   Lactulose  20 GM/30ML SOLN Take 15 mLs (10 g total) by mouth 2 (two) times daily as needed. 09/13/24  Yes Sevannah Madia, DO  acetaminophen  (TYLENOL ) 500 MG tablet Take 500 mg by mouth every 6 (six) hours as needed for moderate pain.    [provider]  amLODipine (NORVASC) 5 MG tablet Take 10 mg by mouth every morning. 10/01/20   [provider]  aspirin EC 81 MG tablet Take 81 mg by mouth every evening.     [provider]  B-D ULTRAFINE III SHORT PEN 31G X 8 MM MISC  03/20/19   [provider]  calcium -vitamin D  (OSCAL WITH D) 500-5 MG-MCG tablet Take 2 tablets by mouth 2 (two) times daily. 06/05/22 11/12/22  Tonette Lauraine HERO, PA-C  cloNIDine (CATAPRES) 0.1 MG tablet Take 0.1 mg by mouth 2 (two) times daily.    [provider]  Continuous Blood Gluc Sensor (FREESTYLE LIBRE SENSOR SYSTEM) MISC Use 3 each every 10 (ten) days 02/22/18   [provider]  hydrochlorothiazide (MICROZIDE) 12.5 MG capsule Take 12.5 mg by mouth 2 (two) times daily. 06/20/21   [provider]  ibandronate  (BONIVA ) 150 MG tablet Take 1 tablet (150 mg total) by mouth every 30 (thirty) days. Take in the morning with a full glass of water, on an empty stomach, and do not take anything else by mouth or lie down for the next 30 min. 11/22/23   Brahmanday, Govinda R, MD  insulin  lispro (HUMALOG) 100 UNIT/ML injection Inject 11 Units into the skin 2 (two) times daily before a meal. 11 units at lunch and 9 units at dinner    [provider]  lisinopril (ZESTRIL) 20 MG tablet Take 20 mg by mouth 2 (two) times daily. 12/19/21   [provider]  meloxicam (MOBIC) 15 MG tablet Take 15 mg by mouth daily.    [provider]  metFORMIN (GLUCOPHAGE) 1000 MG tablet Take 1,000 mg by mouth 2 (two) times daily with a meal.    [provider]  Omega-3 Fatty Acids (FISH OIL) 1200 MG CAPS Take 2,400 mg by mouth 2 (two) times daily.     [provider]  oxyCODONE  (ROXICODONE ) 5 MG immediate release tablet Take 1 tablet (5 mg total) by mouth every 8 (eight) hours as needed. 07/07/24 07/07/25  [name], MD  pregabalin (LYRICA) 25 MG capsule Take 25 mg by mouth 3 (three) times daily.    [provider]  rosuvastatin (CRESTOR) 5 MG tablet Take 5 mg by mouth every other day. In the evening    [provider]  TOUJEO MAX SOLOSTAR 300 UNIT/ML Solostar Pen 46 units in morning 04/19/22   [provider]  VELTASSA 8.4 g packet Take 1 packet by mouth daily.    [provider]    Family History Family History  Problem Relation Age of Onset   Breast cancer Mother 62  Cancer Mother    Cancer Maternal Aunt    Cancer Maternal Grandmother     Social History Social History[1]   Allergies   Alendronate, Bactrim [sulfamethoxazole-trimethoprim], Empagliflozin, Macrodantin [nitrofurantoin macrocrystal], Sulfa antibiotics, and Latex   Review of Systems Review of Systems :negative unless otherwise stated in HPI.      Physical Exam Triage Vital Signs ED Triage Vitals [09/13/24 1204]  Encounter Vitals Group     BP (S) (!) 161/106     Girls Systolic BP Percentile      Girls Diastolic BP Percentile      Boys Systolic BP Percentile      Boys Diastolic BP Percentile      Pulse Rate (!) 104     Resp 18     Temp 98.8 F (37.1 C)     Temp Source Oral     SpO2 98 %     Weight      Height      Head Circumference      Peak Flow      Pain Score 0     Pain Loc      Pain Education      Exclude from Growth Chart    No data found.  Updated Vital Signs BP (!) 157/76 (BP Location: Left Arm)   Pulse 81   Temp 98.8 F (37.1 C) (Oral)   Resp 18   SpO2 98%   Visual Acuity Right Eye Distance:   Left Eye Distance:   Bilateral Distance:    Right Eye Near:   Left Eye Near:    Bilateral Near:     Physical Exam  GEN: pleasant well appearing elderly female, in no acute distress  CV: regular rate and  rhythm RESP: no increased work of breathing, clear to ascultation bilaterally ABD: Bowel sounds present. Soft, non-tender, non-distended.  No guarding, no rebound, no appreciable hepatosplenomegaly, negative McBurney's, negative Murphy MSK: no extremity edema SKIN: warm, dry, no rash on visible skin NEURO: alert, moves all extremities appropriately PSYCH: Normal affect, appropriate speech and behavior   UC Treatments / Results  Labs (all labs ordered are listed, but only abnormal results are displayed) Labs Reviewed - No data to display  EKG  If EKG performed, see my interpretation and MDM section  Radiology No results found.   Procedures Procedures (including critical care time)  Medications Ordered in UC Medications - No data to display  Initial Impression / Assessment and Plan / UC Course  I have reviewed the triage vital signs and the nursing notes.  Pertinent labs & imaging results that were available during my care of the patient were reviewed by me and considered in my medical decision making (see chart for details).       Patient is a  78 y.o. female who presents after having constipation and rectal pain. Overall, patient is well-appearing, well-hydrated, and in no acute distress.  Vital signs stable.  Jois afebrile.  Exam is not concerning for an acute abdomen.   She had a large bowel movement this morning. She declined a rectal exam to assess for fissures. Reports no blood and is UTD on her colonoscopy.  She was concerned due to her panic symptoms but has since calmed down.  Reassurance provided.   Declined abdominal xray and labs. Provided and reviewed constipation clean out and maintenance plan with patient. Prescribed Lactulose  for constipation related to Ozempic use. Discussed eating small meals frequently and increased fluid intake, as well as gradual increase  in fiber intake with dried fruits, vegetables with skins, beans, whole grains and cereal. Goal 25-35g  of fiber per day. Encouraged drinking hot beverages and prune juice; add probiotic-containing foods like pasteurized yogurt and kefir; encourage increased physical activity.    Follow-up, return and ED precautions given. Discussed MDM, treatment plan and plan for follow-up with patient who agrees with plan.    Final Clinical Impressions(s) / UC Diagnoses   Final diagnoses:  Rectal pain  Constipation, unspecified constipation type     Discharge Instructions      Be sure to drink plenty of water.  Take the Lactuolose as needed for constipation. Stop by the pharmacy to pick up your prescriptions.  Follow up with your primary care provider or return to the urgent care, if not improving.       ED Prescriptions     Medication Sig Dispense Auth. Provider   Lactulose  20 GM/30ML SOLN Take 15 mLs (10 g total) by mouth 2 (two) times daily as needed. 450 mL Jenavie Stanczak, DO      PDMP not reviewed this encounter.      [1]  Social History Tobacco Use   Smoking status: Never   Smokeless tobacco: Never  Vaping Use   Vaping status: Never Used  Substance Use Topics   Alcohol use: Not Currently    Comment: RAREly   Drug use: No     Marky Buresh, DO 09/25/24 0904  "

## 2024-09-13 NOTE — ED Triage Notes (Signed)
 Patient presents to UC for constipation and chills since today. States last BM today at 0900. She feels discomfort to rectum area. She reports starting Ozempic in Oct.

## 2024-09-13 NOTE — Discharge Instructions (Addendum)
 Be sure to drink plenty of water.  Take the Lactuolose as needed for constipation. Stop by the pharmacy to pick up your prescriptions.  Follow up with your primary care provider or return to the urgent care, if not improving.

## 2024-11-16 ENCOUNTER — Ambulatory Visit

## 2024-11-21 ENCOUNTER — Ambulatory Visit: Admitting: Internal Medicine

## 2024-11-21 ENCOUNTER — Other Ambulatory Visit
